# Patient Record
Sex: Male | Born: 1960 | Race: Black or African American | Hispanic: No | Smoking: Never smoker
Health system: Southern US, Community
[De-identification: ages and names within clinical notes are randomized; demographics above are authoritative.]

## PROBLEM LIST (undated history)

## (undated) DIAGNOSIS — I499 Cardiac arrhythmia, unspecified: Secondary | ICD-10-CM

## (undated) DIAGNOSIS — I509 Heart failure, unspecified: Secondary | ICD-10-CM

## (undated) DIAGNOSIS — M1990 Unspecified osteoarthritis, unspecified site: Secondary | ICD-10-CM

## (undated) DIAGNOSIS — E119 Type 2 diabetes mellitus without complications: Secondary | ICD-10-CM

## (undated) DIAGNOSIS — I10 Essential (primary) hypertension: Secondary | ICD-10-CM

## (undated) DIAGNOSIS — K219 Gastro-esophageal reflux disease without esophagitis: Secondary | ICD-10-CM

## (undated) DIAGNOSIS — I480 Paroxysmal atrial fibrillation: Secondary | ICD-10-CM

## (undated) HISTORY — DX: Heart failure, unspecified: I50.9

## (undated) HISTORY — DX: Paroxysmal atrial fibrillation: I48.0

---

## 1998-02-20 ENCOUNTER — Emergency Department (HOSPITAL_COMMUNITY): Admission: EM | Admit: 1998-02-20 | Discharge: 1998-02-20 | Payer: Self-pay | Admitting: Emergency Medicine

## 2004-07-26 ENCOUNTER — Ambulatory Visit: Payer: Self-pay | Admitting: Internal Medicine

## 2004-08-06 ENCOUNTER — Ambulatory Visit: Payer: Self-pay | Admitting: Gastroenterology

## 2004-08-06 HISTORY — PX: UPPER GI ENDOSCOPY: SHX6162

## 2005-10-22 ENCOUNTER — Encounter: Payer: Self-pay | Admitting: Emergency Medicine

## 2008-04-04 ENCOUNTER — Ambulatory Visit: Payer: Self-pay | Admitting: Gastroenterology

## 2008-04-12 ENCOUNTER — Ambulatory Visit (HOSPITAL_COMMUNITY): Admission: RE | Admit: 2008-04-12 | Discharge: 2008-04-12 | Payer: Self-pay | Admitting: Gastroenterology

## 2008-04-12 ENCOUNTER — Ambulatory Visit: Payer: Self-pay | Admitting: Gastroenterology

## 2008-04-12 ENCOUNTER — Encounter: Payer: Self-pay | Admitting: Gastroenterology

## 2008-06-23 HISTORY — PX: ESOPHAGOGASTRODUODENOSCOPY: SHX1529

## 2008-06-23 HISTORY — PX: COLONOSCOPY: SHX174

## 2008-10-30 ENCOUNTER — Telehealth (INDEPENDENT_AMBULATORY_CARE_PROVIDER_SITE_OTHER): Payer: Self-pay

## 2010-07-23 NOTE — Progress Notes (Signed)
Summary: blood in stool  Phone Note Call from Patient Call back at Home Phone [phone]   Caller: Patient Call For: Dr. Cira Servant Summary of Call: pt called- had dark stools x3days then started having bright red blood in stool. Has some stomach cramps at times but no N/V. States he feels weak. Please advise Initial call taken by: Hendricks Limes LPN,  Oct 30, 2008 9:00 AM      Appended Document: blood in stool Please call pt. If he is hvaing rectal bleeding and feeling weak, he should proceed to the ED.  Appended Document: blood in stool pt aware, stated he has appt with PCP at [facility] tomorrow and he may just wait and go there first before trying the ED.

## 2010-11-05 NOTE — Op Note (Signed)
Nguyen, Henry            ACCOUNT NO.:  [id number]   MEDICAL RECORD NO.:  [id number]          PATIENT TYPE:  AMB   LOCATION:  DAY                           FACILITY:  [facility]   PHYSICIAN:  Kassie Mends, M.D.      DATE OF BIRTH:  04-21-1961   DATE OF PROCEDURE:  DATE OF DISCHARGE:                               OPERATIVE REPORT   REFERRING PROVIDED:  [facility] Department.   PROCEDURE:  1. Colonoscopy.  2. Esophagogastroduodenoscopy with cold forceps biopsy.   INDICATION FOR EXAM:  Henry Nguyen is a 50 year old male who presents  with new-onset dyspepsia and rectal bleeding.   FINDINGS:  1. Small internal hemorrhoids.  Pancolonic diverticulosis (left      greater than right).  Otherwise, no polyps, masses, inflammatory      changes, or AVMs seen.  2. Normal esophagus without evidence of Barrett, mass, erosion,      ulceration, or stricture.  3. Patchy erythema seen in the body in the antrum of the stomach with      erosions.  No ulcerations.  No old blood or fresh blood seen in the      stomach.  Biopsies obtained via cold forceps to evaluate for H.      pylori gastritis.  4. Patchy erythema in the duodenal bulb.  Normal second portion of the      duodenum.   DIAGNOSES:  1. Rectal bleeding likely secondary to hemorrhoids.  2. Pancolonic diverticulosis, moderate.  3. Mild gastritis and duodenitis.   RECOMMENDATIONS:  1. Screening colonoscopy in 10 years.  2. He should follow a low-fat diet.  He was given a handout on low-fat      and a high-fiber diet which he should follow.  He was also given      information on diverticulosis, hemorrhoids, and gastritis.  He was      given the [facility] handout on management of gastroesophageal      reflux disease.  I did discuss with him the things that he needs to      do to make his reflux more well controlled.  The discussion      occurred prior to his procedure.  3. No aspirin, NSAIDs, or anticoagulation for 7  days.   MEDICATIONS:  1. Demerol 100 mg IV.  2. Versed 5 mg IV.   PROCEDURE TECHNIQUE:  Physical exam was performed.  Informed consent was  obtained from the patient after explaining the benefits, risks, and  alternatives to the procedure.  The patient was connected to the monitor  and placed in left lateral position.  Continuous oxygen was provided by  nasal cannula.  IV medicine administered through an indwelling cannula.  After administration of sedation and rectal exam, the patient's rectum  was intubated and the scope was advanced under direct visualization to  the cecum.  Scope was removed slowly by carefully examining the color,  texture, anatomy, and integrity of the mucosa on the way out.   After colonoscopy, the patient's esophagus was intubated with a  diagnostic gastroscope, and the scope was advanced under direct  visualization to the second portion of the duodenum.  The scope was  removed slowly by carefully examining the color, texture, anatomy, and  integrity of the mucosa on the way out.  The patient was recovered in  endoscopy and discharged home in satisfactory condition.   PATH:  H. pylori gastritis.      Kassie Mends, M.D.  Electronically Signed     SM/MEDQ  D:  04/12/2008  T:  04/12/2008  Job:  [id number]   cc:   [facility]

## 2010-11-05 NOTE — Consult Note (Signed)
Henry Nguyen, Henry Nguyen            ACCOUNT NO.:  [id number]   MEDICAL RECORD NO.:  [id number]          PATIENT TYPE:  AMB   LOCATION:  DAY                           FACILITY:  [facility]   PHYSICIAN:  Kassie Mends, M.D.      DATE OF BIRTH:  February 12, 1961   DATE OF CONSULTATION:  04/04/2008  DATE OF DISCHARGE:                                 CONSULTATION   REASON FOR CONSULTATION:  Rectal bleeding.   PHYSICIAN REQUESTING CONSULTATION:  Dierdre Forth, nurse practitioner  with [facility] Department.   HISTORY OF PRESENT ILLNESS:  The patient is a 50 year old African-  American gentleman who presents today with complaints of recent  hematochezia.  He had 5 days where with each bowel movement he had  bright red blood per rectum.  He got quite concerned about this.  He  states this he had this a couple of years ago too.  Denies any NSAID or  aspirin use.  At that time, he has only taken some Tylenol.  He denies  any constipation, diarrhea, weight loss.  He also has chronic GERD.  He  states he had an EGD about 3 or 4 years ago with [facility], which  showed he had severe esophagitis.  He was started on PPI.  He took this  for a while because of unanswered reasons, had come off medication.  He  recently have been started back on ranitidine.  He states that the  ranitidine does not help his heartburn at all.  He has daily heartburn.  Denies dysphagia or odynophagia.  His heartburn severely develops  vomiting.  Last time he did this was yesterday.  He has had a sore  throat for a couple of days.  Denies cough or congestion.   CURRENT MEDICATION:  1. Ranitidine 150 mg b.i.d.  2. Tylenol p.r.n.  3. Backaid Max p.r.n.   ALLERGIES:  No known drug allergies.   PAST MEDICAL HISTORY:  Arthritis of the knees, chronic GERD.  He had a  left upper leg surgery due to brown recluse spider bite.   FAMILY HISTORY:  Second-degree relatives with history of colon cancer  and uncles and  cousins.   SOCIAL HISTORY:  Single, unemployed, never been a smoker, history of  crack cocaine use over a year ago, none since.  No alcohol in over a  year.  States he was never a daily drinker.   REVIEW OF SYSTEMS:  GI: As in HPI.  CONSTITUTIONAL:  Denies weight loss.  CARDIOPULMONARY:  Denies chest pain, palpitations, shortness of breath,  or cough.  GENITOURINARY:  Denies dysuria or hematuria.   PHYSICAL EXAMINATION:  VITAL SIGNS:  Weight 238.5, height 5 feet 9  inches, temp 98, blood pressure 130/90, and pulse 76.  GENERAL:  Pleasant, well-nourished, well-developed white gentleman in no  acute distress.  SKIN:  Warm and dry.  No jaundice.  HEENT:  Sclera nonicteric.  Oropharyngeal mucosa moist and pink.  No  lesion, erythema, or exudates.  No lymphadenopathy or thyromegaly.  CHEST:  Lungs are clear to auscultation.  CARDIAC:  Regular rate and rhythm.  Normal S1, S2.  No murmurs, rubs, or  gallops.  ABDOMEN:  Positive bowel sound.  Abdomen is soft, nontender, and  nondistended.  No organomegaly or masses.  No rebound or guarding.  No  abdominal bruits or hernias.  LOWER EXTREMITIES:  No edema.   IMPRESSION:  Henry Nguyen is a 50 year old gentleman with recent small  volume hematochezia.  He has never had a colonoscopy.  Recommend  diagnostic colonoscopy at this time.  He also gives a history of chronic  gastroesophageal reflux disease with poorly controlled reflux on H2  blocker currently.  He also had some intermittent vomiting may be  related to his reflux disease.  We would like to review this prior EGD  report and make determination if he needs to have a repeat EGD done as  well.   PLAN:  1. Colonoscopy with Dr. Cira Servant in the near future.  He is to take no      aspirin products for 4 days prior to procedure.  He will check the      [facility] and make sure there is no aspirin in it.  If he is      unsure, he will hold it for 4 days prior to procedure.  2. Prilosec OTC  one daily, #27 was provided.  3. He will use ranitidine on a p.r.n. basis only.  4. Retrieve records from [facility] regarding last EGD report.      Tana Coast, P.A.      Kassie Mends, M.D.  Electronically Signed    LL/MEDQ  D:  04/04/2008  T:  04/05/2008  Job:  [id number]   cc:   Dierdre Forth, NP  [facility] Department

## 2015-08-15 ENCOUNTER — Ambulatory Visit (INDEPENDENT_AMBULATORY_CARE_PROVIDER_SITE_OTHER): Payer: Worker's Compensation | Admitting: Pediatrics

## 2015-08-15 ENCOUNTER — Ambulatory Visit (INDEPENDENT_AMBULATORY_CARE_PROVIDER_SITE_OTHER): Payer: Worker's Compensation

## 2015-08-15 VITALS — BP 146/86 | HR 72 | Temp 96.9°F | Ht 69.0 in | Wt 212.2 lb

## 2015-08-15 DIAGNOSIS — M79642 Pain in left hand: Secondary | ICD-10-CM | POA: Diagnosis not present

## 2015-08-15 DIAGNOSIS — M25532 Pain in left wrist: Secondary | ICD-10-CM

## 2015-08-15 IMAGING — CR DG HAND COMPLETE 3+V*L*
3 series · 3 of 3 positions shown · non-contrast
Comparison: None in PACs

CLINICAL DATA: Post fall backwards catching himself with the left
hand and wrist.

EXAM:
LEFT HAND - COMPLETE 3+ VIEW

[view not recorded (1 of 3)]
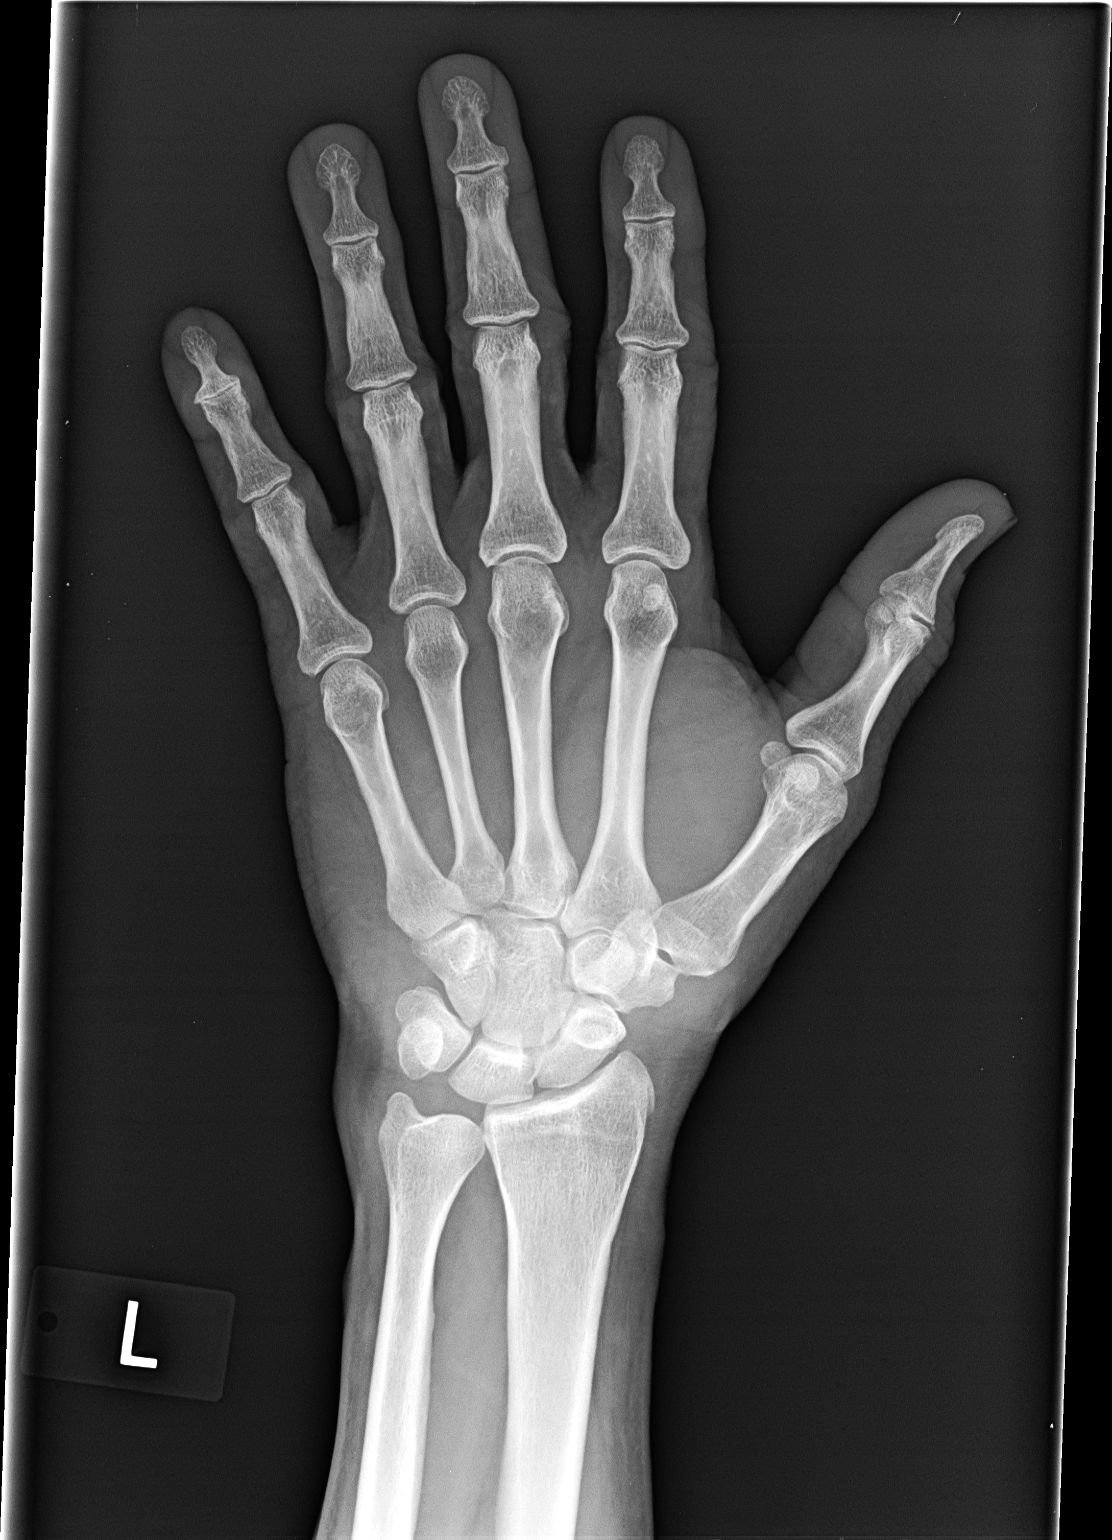

[view not recorded (2 of 3)]
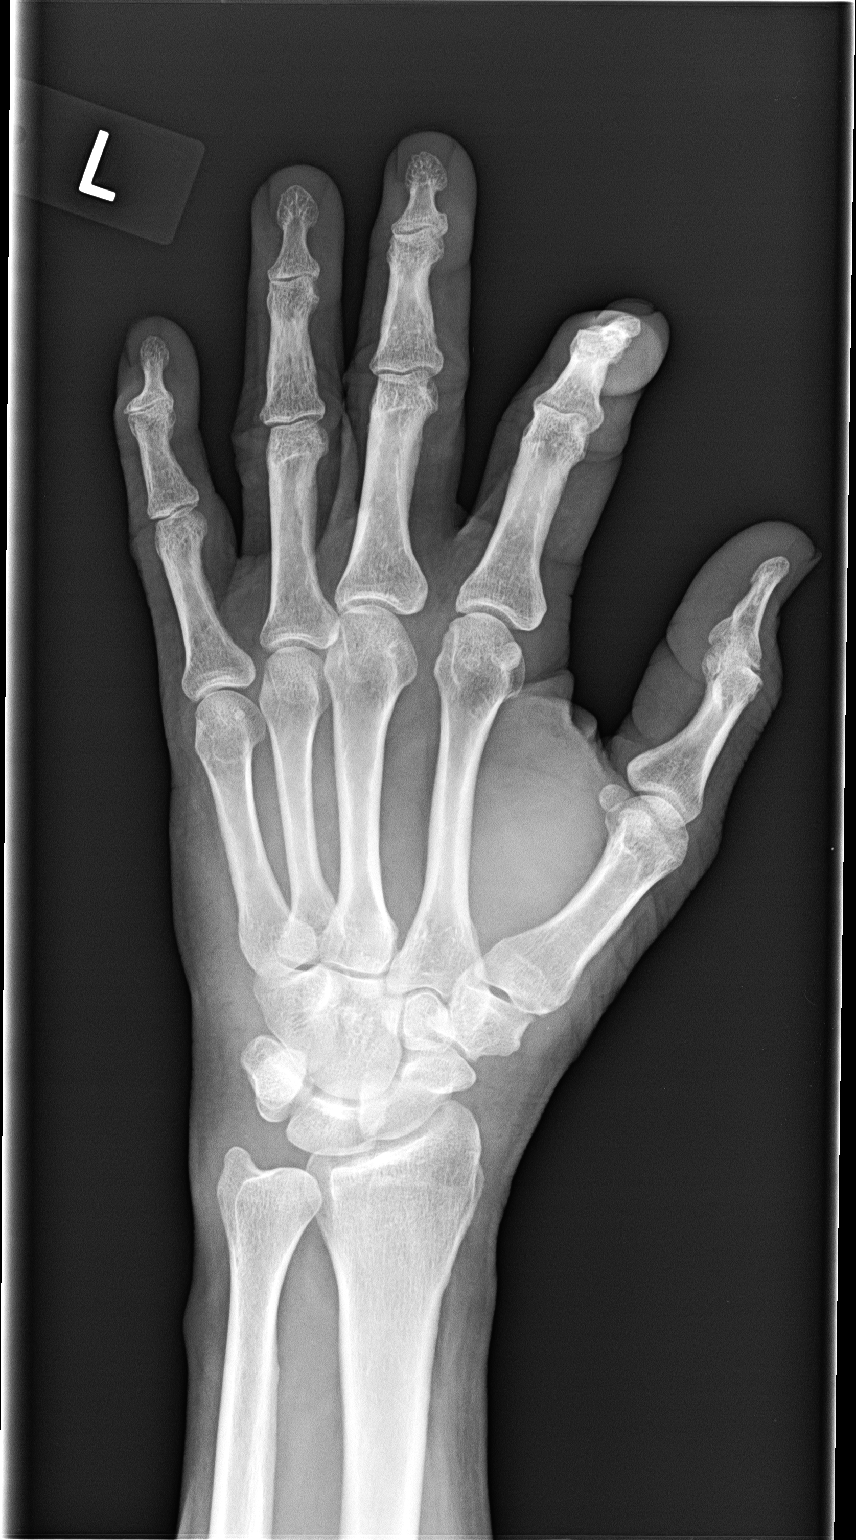

[view not recorded (3 of 3)]
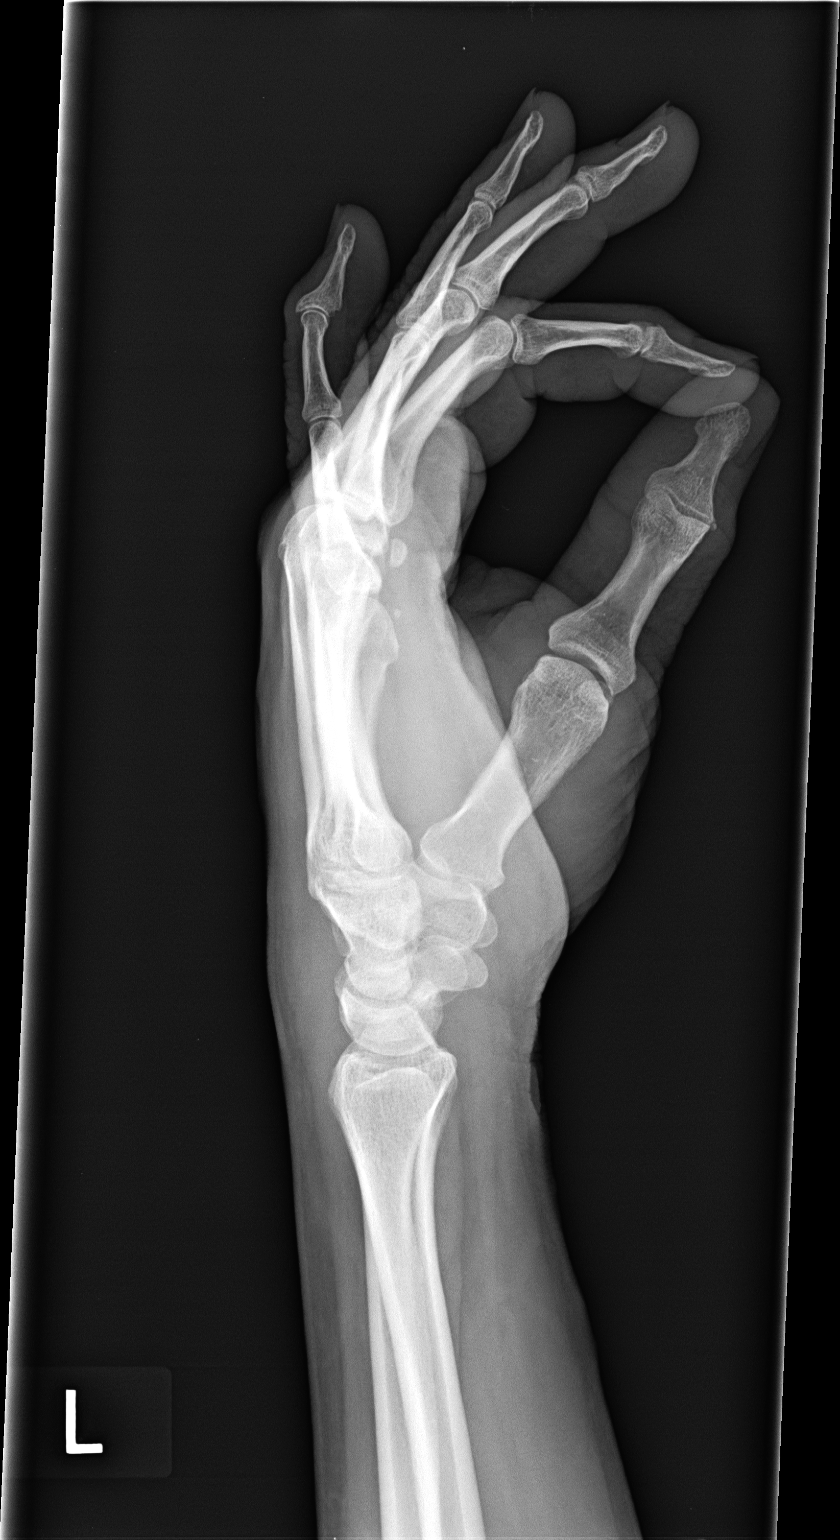

[3 of 3 positions shown; findings below may reference images not displayed]

FINDINGS: The bones of the left hand are adequately mineralized. There is no
acute fracture nor dislocation. There is mild narrowing of the
interphalangeal joints. Mild hypertrophic changes of the DIP joint
of the fifth finger are noted. The metacarpophalangeal joints and
the carpometacarpal joints are preserved. The observed portions of
the carpal bones and distal radius and ulna appear normal.
IMPRESSION: There is no acute bony abnormality of the left hand. There is mild
osteoarthritic change of the interphalangeal joints.

## 2015-08-15 NOTE — Patient Instructions (Signed)
Wrist Pain There are many things that can cause wrist pain. Some common causes include:  An injury to the wrist area, such as a sprain, strain, or fracture.  Overuse of the joint.  A condition that causes increased pressure on a nerve in the wrist (carpal tunnel syndrome).  Wear and tear of the joints that occurs with aging (osteoarthritis).  A variety of other types of arthritis. Sometimes, the cause of wrist pain is not known. The pain often goes away when you follow your health care provider's instructions for relieving pain at home. If your wrist pain continues, tests may need to be done to diagnose your condition. HOME CARE INSTRUCTIONS Pay attention to any changes in your symptoms. Take these actions to help with your pain:  Rest the wrist area for at least 48 hours or as told by your health care provider.  If directed, apply ice to the injured area:  Put ice in a plastic bag.  Place a towel between your skin and the bag.  Leave the ice on for 20 minutes, 2-3 times per day.  Keep your arm raised (elevated) above the level of your heart while you are sitting or lying down.  If a splint or elastic bandage has been applied, use it as told by your health care provider.  Remove the splint or bandage only as told by your health care provider.  Loosen the splint or bandage if your fingers become numb or have a tingling feeling, or if they turn cold or blue.  Take over-the-counter and prescription medicines only as told by your health care provider.  Keep all follow-up visits as told by your health care provider. This is important. SEEK MEDICAL CARE IF:  Your pain is not helped by treatment.  Your pain gets worse. SEEK IMMEDIATE MEDICAL CARE IF:  Your fingers become swollen.  Your fingers turn white, very red, or cold and blue.  Your fingers are numb or have a tingling feeling.  You have difficulty moving your fingers.   This information is not intended to replace  advice given to you by your health care provider. Make sure you discuss any questions you have with your health care provider.   Document Released: 03/19/2005 Document Revised: 02/28/2015 Document Reviewed: 10/25/2014 Elsevier Interactive Patient Education 2016 Elsevier Inc. Scaphoid Fracture, Wrist A fracture is a break in the bone. The bone you have broken often does not show up as a fracture on x-ray until later on in the healing phase. This bone is called the scaphoid bone. With this bone, your caregiver will often cast or splint your wrist as though it is fractured, even if a fracture is not seen on the x-ray. This is often done with wrist injuries in which there is tenderness at the base of the thumb. An x-ray at 1-3 weeks after your injury may confirm this fracture. A cast or splint is used to protect and keep your injured bone in good position for healing. The cast or splint will be on generally for about 6 to 16 weeks, depending on your health, age, the fracture location and how quickly you heal. Another name for the scaphoid bone is the navicular bone. HOME CARE INSTRUCTIONS   To lessen the swelling and pain, keep the injured part elevated above your heart while sitting or lying down.  Apply ice to the injury for 15-20 minutes, 03-04 times per day while awake, for 2 days. Put the ice in a plastic bag and place a  thin towel between the bag of ice and your cast.  If you have a plaster or fiberglass cast or splint:  Do not try to scratch the skin under the cast using sharp or pointed objects.  Check the skin around the cast every day. You may put lotion on any red or sore areas.  Keep your cast or splint dry and clean.  If you have a plaster splint:  Wear the splint as directed.  You may loosen the elastic bandage around the splint if your fingers become numb, tingle, or turn cold or blue.  If you have been put in a removable splint, wear and use as directed.  Do not put pressure  on any part of your cast or splint; it may deform or break. Rest your cast or splint only on a pillow the first 24 hours until it is fully hardened.  Your cast or splint can be protected during bathing with a plastic bag. Do not lower the cast or splint into water.  Only take over-the-counter or prescription medicines for pain, discomfort, or fever as directed by your caregiver.  If your caregiver has given you a follow up appointment, it is very important to keep that appointment. Not keeping the appointment could result in chronic pain and decreased function. If there is any problem keeping the appointment, you must call back to this facility for assistance. SEEK IMMEDIATE MEDICAL CARE IF:   Your cast gets damaged, wet or breaks.  You have continued severe pain or more swelling than you did before the cast or splint was put on.  Your skin or nails below the injury turn blue or gray, or feel cold or numb.  You have tingling or burning pain in your fingers or increasing pain with movement of your fingers   This information is not intended to replace advice given to you by your health care provider. Make sure you discuss any questions you have with your health care provider.   Document Released: 05/30/2002 Document Revised: 09/01/2011 Document Reviewed: 12/20/2014 Elsevier Interactive Patient Education [employer].

## 2015-08-15 NOTE — Progress Notes (Addendum)
    Subjective:    Patient ID: Henry Nguyen, male    DOB: 22-Jul-1960, 55 y.o.   MRN: [id number]  CC: Worker's comp   HPI: Henry Nguyen is a 55 y.o. male presenting for [employer] from standing onto concrete backward onto dorsiflexed hand today, 08/15/2015 Hurt immediately Has taken ibuprofen, for pain Throbbing all the time No other injury in fall Can wiggle fingers, pain with movement wrist in all directions, including supination Points to lateral dorsum of wrist when asked where pain is greatest Wrist is swollen Sensation intact in hand  Relevant past medical, surgical, family and social history reviewed and updated as indicated. Interim medical history since our last visit reviewed. Allergies and medications reviewed and updated.    ROS: Per HPI unless specifically indicated above       Objective:    BP 146/86 mmHg  Pulse 72  Temp(Src) 96.9 F (36.1 C) (Oral)  Ht 5\' 9"  (1.753 m)  Wt 212 lb 3.2 oz (96.253 kg)  BMI 31.32 kg/m2  Wt Readings from Last 3 Encounters:  08/15/15 212 lb 3.2 oz (96.253 kg)    Gen: NAD, alert, cooperative with exam, NCAT EYES: EOMI, no scleral injection or icterus CV: WWP, distal pulses 2+ b/l Resp:  normal WOB Neuro: Alert and oriented, strength equal b/l UE and LE, coordination grossly normal MSK: L posterior lateral wrist swollen, TTP over snuff box, distal radius, proximal 2nd and 3rd metacarpals. Minimal ROM in wrist limited by pain, moves a few degrees in all directions, can extend and flex fingers though does have pain, pain with thumb movement, pain with supination.     Assessment & Plan:    Henry Nguyen was seen today for L wrist injury, fall backward onto outstretched hand. Tender over snuff box, limited mobility in hand. Xray with no acute fracture. Cannot rule out scaphoid fracture given exam. Will put in wrist/thumb immobilizer, wear all the time, RTC 1 week for L wrist xray.    Diagnoses and all orders for this  visit:  Wrist pain, acute, left -     DG Hand Complete Left; Future  Hand pain, left -     DG Hand Complete Left; Future   Follow up plan: Return in about 1 week (around 08/22/2015) for will need repeat L wrist film.  Assunta Found, MD [facility] 08/15/2015, 12:51 PM

## 2015-08-21 ENCOUNTER — Ambulatory Visit (INDEPENDENT_AMBULATORY_CARE_PROVIDER_SITE_OTHER): Payer: Worker's Compensation | Admitting: Nurse Practitioner

## 2015-08-21 ENCOUNTER — Encounter: Payer: Self-pay | Admitting: Nurse Practitioner

## 2015-08-21 ENCOUNTER — Ambulatory Visit (INDEPENDENT_AMBULATORY_CARE_PROVIDER_SITE_OTHER): Payer: Worker's Compensation

## 2015-08-21 ENCOUNTER — Ambulatory Visit: Payer: Worker's Compensation | Admitting: Nurse Practitioner

## 2015-08-21 VITALS — BP 154/87 | HR 66 | Temp 97.5°F | Ht 69.0 in | Wt 219.0 lb

## 2015-08-21 DIAGNOSIS — S66292D Other specified injury of extensor muscle, fascia and tendon of left thumb at wrist and hand level, subsequent encounter: Secondary | ICD-10-CM

## 2015-08-21 DIAGNOSIS — S66292S Other specified injury of extensor muscle, fascia and tendon of left thumb at wrist and hand level, sequela: Secondary | ICD-10-CM

## 2015-08-21 DIAGNOSIS — S63602D Unspecified sprain of left thumb, subsequent encounter: Secondary | ICD-10-CM

## 2015-08-21 DIAGNOSIS — S63502D Unspecified sprain of left wrist, subsequent encounter: Secondary | ICD-10-CM

## 2015-08-21 IMAGING — CR DG WRIST COMPLETE 3+V*L*
3 series · 3 of 3 positions shown · non-contrast
Comparison: Plain films of the left hand [DATE].

CLINICAL DATA: Status post fall backwards [DATE]. Continued
right thumb pain. Subsequent encounter.

EXAM:
LEFT WRIST - COMPLETE 3+ VIEW

[view not recorded (1 of 3)]
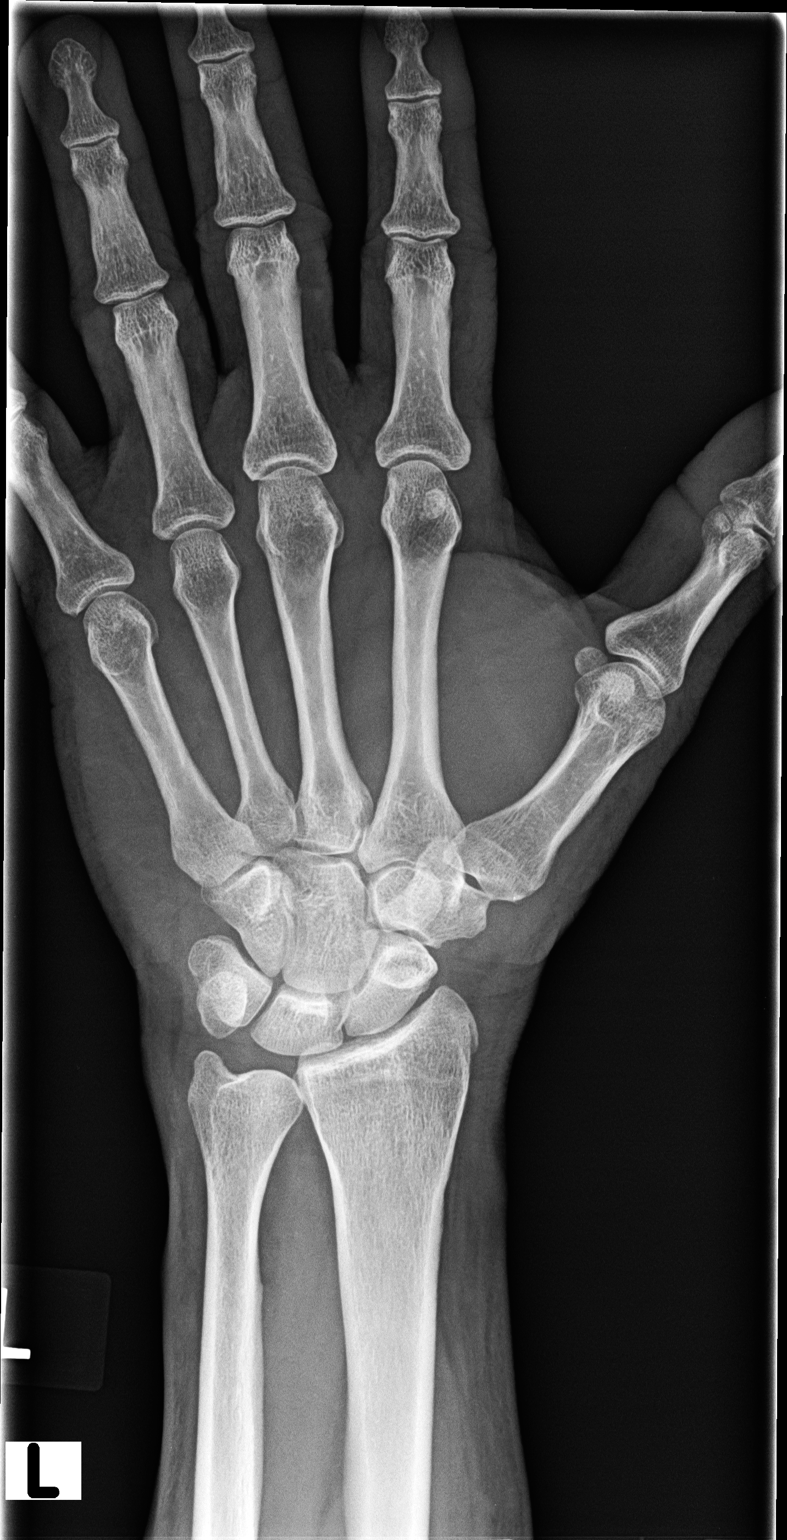

[view not recorded (2 of 3)]
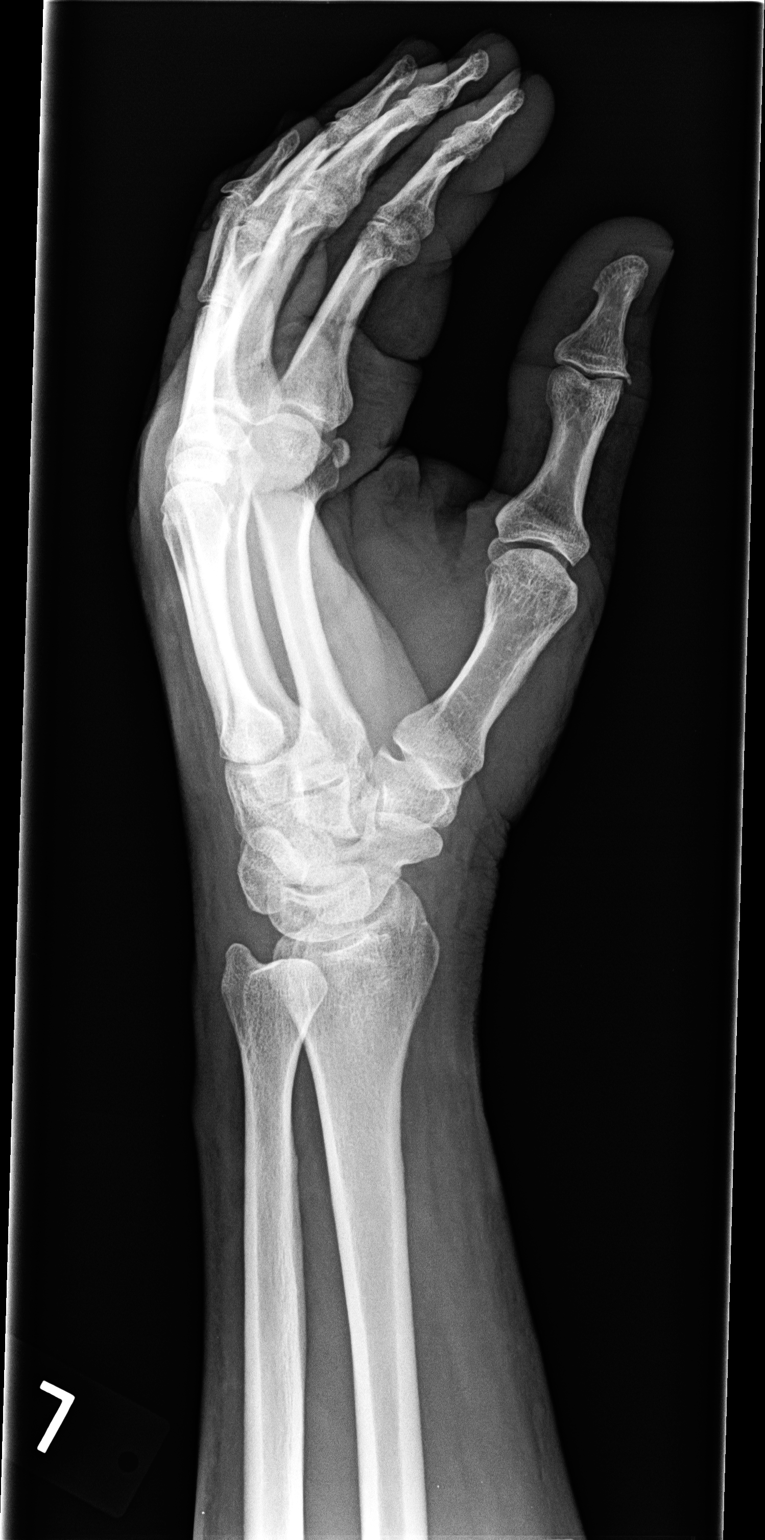

[view not recorded (3 of 3)]
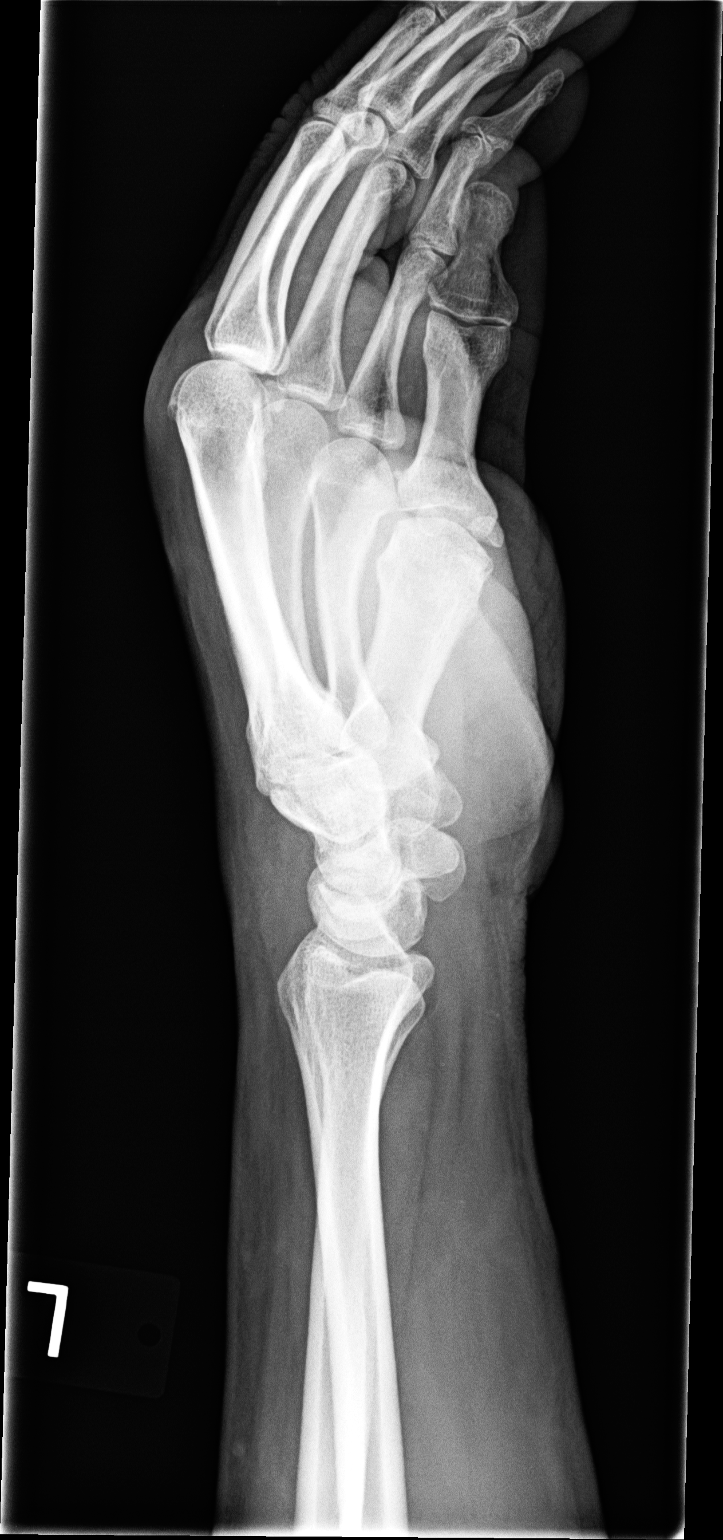

[3 of 3 positions shown; findings below may reference images not displayed]

FINDINGS: There is no evidence of fracture or dislocation. There is no
evidence of arthropathy or other focal bone abnormality. Soft
tissues are unremarkable.
IMPRESSION: Negative exam.

## 2015-08-21 NOTE — Patient Instructions (Signed)
Wrist Sprain °A wrist sprain is a stretch or tear in the strong, fibrous tissues (ligaments) that connect your wrist bones. The ligaments of your wrist may be easily sprained. There are three types of wrist sprains. °· Grade 1. The ligament is not stretched or torn, but the sprain causes pain. °· Grade 2. The ligament is stretched or partially torn. You may be able to move your wrist, but not very much. °· Grade 3. The ligament or muscle completely tears. You may find it difficult or extremely painful to move your wrist even a little. °CAUSES °Often, wrist sprains are a result of a fall or an injury. The force of the impact causes the fibers of your ligament to stretch too much or tear. Common causes of wrist sprains include: °· Overextending your wrist while catching a ball with your hands. °· Repetitive or strenuous extension or bending of your wrist. °· Landing on your hand during a fall. °RISK FACTORS °· Having previous wrist injuries. °· Playing contact sports, such as boxing or wrestling. °· Participating in activities in which falling is common. °· Having poor wrist strength and flexibility. °SIGNS AND SYMPTOMS °· Wrist pain. °· Wrist tenderness. °· Inflammation or bruising of the wrist area. °· Hearing a "pop" or feeling a tear at the time of the injury. °· Decreased wrist movement due to pain, stiffness, or weakness. °DIAGNOSIS °Your health care provider will examine your wrist. In some cases, an X-ray will be taken to make sure you did not break any bones. If your health care provider thinks that you tore a ligament, he or she may order an MRI of your wrist. °TREATMENT °Treatment involves resting and icing your wrist. You may also need to take pain medicines to help lessen pain and inflammation. Your health care provider may recommend keeping your wrist still (immobilized) with a splint to help your sprain heal. When the splint is no longer necessary, you may need to perform strengthening and stretching  exercises. These exercises help you to regain strength and full range of motion in your wrist. Surgery is not usually needed for wrist sprains unless the ligament completely tears. °HOME CARE INSTRUCTIONS °· Rest your wrist. Do not do things that cause pain. °· Wear your wrist splint as directed by your health care provider. °· Take medicines only as directed by your health care provider. °· To ease pain and swelling, apply ice to the injured area. °¨ Put ice in a plastic bag. °¨ Place a towel between your skin and the bag. °¨ Leave the ice on for 20 minutes, 2-3 times a day. °SEEK MEDICAL CARE IF: °· Your pain, discomfort, or swelling gets worse even with treatment. °· You feel sudden numbness in your hand. °  °This information is not intended to replace advice given to you by your health care provider. Make sure you discuss any questions you have with your health care provider. °  °Document Released: 02/10/2014 Document Reviewed: 02/10/2014 °Elsevier Interactive Patient Education ©2016 Elsevier Inc. ° °

## 2015-08-21 NOTE — Progress Notes (Signed)
   Subjective:    Patient ID: Henry Nguyen, male    DOB: 11-14-1960, 55 y.o.   MRN: [id number]  HPI DATE OF INJURY  08/15/15  Staff  [facility]  He She had fallen and injured left wrist and hand- x rays were done which were negative. She was placed in a thumb/wrist immobilizer and was told to follow up in 1 week to repeat xrays. Still having lots of pain with movement. Brace helps some.    Review of Systems  Constitutional: Negative.   Respiratory: Negative.   Cardiovascular: Negative.   Neurological: Negative.   Psychiatric/Behavioral: Negative.   All other systems reviewed and are negative.      Objective:   Physical Exam  Constitutional: He appears well-developed and well-nourished.  Musculoskeletal:  Left thumb wrist brace in place- brace removed- decrease rom of thumb with oppostion to fingers- grip very weak- painon palpation at base of thumb and down wrist and forearm.   BP 154/87 mmHg  Pulse 66  Temp(Src) 97.5 F (36.4 C) (Oral)  Ht 5\' 9"  (1.753 m)  Wt 219 lb (99.338 kg)  BMI 32.33 kg/m2  Left wrist and hand xray- unchanged from previous- no fracture-Preliminary reading by Ronnald Collum, FNP  [facility]      Assessment & Plan:   1. Other specified injury of extensor muscle, fascia and tendon of left thumb at wrist and hand level, sequela   2. Wrist sprain, left, subsequent encounter   3. Thumb sprain, left, subsequent encounter    contiunue  To wear brace Rest Ice Elevate when sitting May take several weeks to resolve Motrin OTC every 6 hours RTO prn  Mary-Margaret Hassell Done, FNP

## 2015-08-22 ENCOUNTER — Ambulatory Visit: Payer: Self-pay | Admitting: Pediatrics

## 2017-12-01 ENCOUNTER — Inpatient Hospital Stay (HOSPITAL_COMMUNITY)
Admission: EM | Admit: 2017-12-01 | Discharge: 2017-12-03 | DRG: 638 | Disposition: A | Discharge status: Home / Self Care | Payer: Self-pay | Attending: Family Medicine | Admitting: Family Medicine

## 2017-12-01 ENCOUNTER — Other Ambulatory Visit: Payer: Self-pay

## 2017-12-01 ENCOUNTER — Encounter (HOSPITAL_COMMUNITY): Payer: Self-pay

## 2017-12-01 DIAGNOSIS — N179 Acute kidney failure, unspecified: Secondary | ICD-10-CM | POA: Diagnosis present

## 2017-12-01 DIAGNOSIS — K219 Gastro-esophageal reflux disease without esophagitis: Secondary | ICD-10-CM | POA: Diagnosis present

## 2017-12-01 DIAGNOSIS — K2100 Gastro-esophageal reflux disease with esophagitis, without bleeding: Secondary | ICD-10-CM

## 2017-12-01 DIAGNOSIS — Z7982 Long term (current) use of aspirin: Secondary | ICD-10-CM

## 2017-12-01 DIAGNOSIS — I10 Essential (primary) hypertension: Secondary | ICD-10-CM | POA: Diagnosis present

## 2017-12-01 DIAGNOSIS — E871 Hypo-osmolality and hyponatremia: Secondary | ICD-10-CM | POA: Diagnosis present

## 2017-12-01 DIAGNOSIS — K625 Hemorrhage of anus and rectum: Secondary | ICD-10-CM | POA: Diagnosis present

## 2017-12-01 DIAGNOSIS — Z79899 Other long term (current) drug therapy: Secondary | ICD-10-CM

## 2017-12-01 DIAGNOSIS — Z7984 Long term (current) use of oral hypoglycemic drugs: Secondary | ICD-10-CM

## 2017-12-01 DIAGNOSIS — E1100 Type 2 diabetes mellitus with hyperosmolarity without nonketotic hyperglycemic-hyperosmolar coma (NKHHC): Principal | ICD-10-CM | POA: Diagnosis present

## 2017-12-01 HISTORY — DX: Essential (primary) hypertension: I10

## 2017-12-01 HISTORY — DX: Type 2 diabetes mellitus without complications: E11.9

## 2017-12-01 HISTORY — DX: Gastro-esophageal reflux disease without esophagitis: K21.9

## 2017-12-01 LAB — URINALYSIS, ROUTINE W REFLEX MICROSCOPIC
Bacteria, UA: NONE SEEN
Bilirubin Urine: NEGATIVE
Glucose, UA: >=500 mg/dL — AB
Hgb urine dipstick: NEGATIVE
Ketones, ur: 5 mg/dL — AB
Leukocytes, UA: NEGATIVE
Nitrite: NEGATIVE
Protein, ur: NEGATIVE mg/dL
Specific Gravity, Urine: 1.026 (ref 1.005–1.030)
pH: 5 (ref 5.0–8.0)

## 2017-12-01 LAB — CBC WITH DIFFERENTIAL/PLATELET
Basophils Absolute: 0 10*3/uL (ref 0.0–0.1)
Basophils Relative: 0 %
Eosinophils Absolute: 0.1 10*3/uL (ref 0.0–0.7)
Eosinophils Relative: 1 %
HCT: 42.2 % (ref 39.0–52.0)
Hemoglobin: 14.1 g/dL (ref 13.0–17.0)
Lymphocytes Relative: 19 %
Lymphs Abs: 1.8 10*3/uL (ref 0.7–4.0)
MCH: 27.6 pg (ref 26.0–34.0)
MCHC: 33.4 g/dL (ref 30.0–36.0)
MCV: 82.6 fL (ref 78.0–100.0)
Monocytes Absolute: 0.9 10*3/uL (ref 0.1–1.0)
Monocytes Relative: 10 %
Neutro Abs: 6.6 10*3/uL (ref 1.7–7.7)
Neutrophils Relative %: 70 %
Platelets: 189 10*3/uL (ref 150–400)
RBC: 5.11 MIL/uL (ref 4.22–5.81)
RDW: 13.2 % (ref 11.5–15.5)
WBC: 9.4 10*3/uL (ref 4.0–10.5)

## 2017-12-01 LAB — COMPREHENSIVE METABOLIC PANEL
ALT: 22 U/L (ref 17–63)
AST: 19 U/L (ref 15–41)
Albumin: 3.8 g/dL (ref 3.5–5.0)
Alkaline Phosphatase: 139 U/L — ABNORMAL HIGH (ref 38–126)
Anion gap: 12 (ref 5–15)
BUN: 34 mg/dL — ABNORMAL HIGH (ref 6–20)
CO2: 24 mmol/L (ref 22–32)
Calcium: 8.8 mg/dL — ABNORMAL LOW (ref 8.9–10.3)
Chloride: 84 mmol/L — ABNORMAL LOW (ref 101–111)
Creatinine, Ser: 1.98 mg/dL — ABNORMAL HIGH (ref 0.61–1.24)
GFR calc Af Amer: 41 mL/min — ABNORMAL LOW (ref >60)
GFR calc non Af Amer: 36 mL/min — ABNORMAL LOW (ref >60)
Glucose, Bld: 807 mg/dL (ref 65–99)
Potassium: 4.1 mmol/L (ref 3.5–5.1)
Sodium: 120 mmol/L — ABNORMAL LOW (ref 135–145)
Total Bilirubin: 0.8 mg/dL (ref 0.3–1.2)
Total Protein: 7.1 g/dL (ref 6.5–8.1)

## 2017-12-01 LAB — LIPASE, BLOOD: Lipase: 50 U/L (ref 11–51)

## 2017-12-01 LAB — CBG MONITORING, ED: Glucose-Capillary: >600 mg/dL (ref 65–99)

## 2017-12-01 MED ORDER — ONDANSETRON HCL 4 MG/2ML IJ SOLN
4.0000 mg | Freq: Four times a day (QID) | INTRAMUSCULAR | Status: DC | PRN
Start: 2017-12-01 — End: 2017-12-03

## 2017-12-01 MED ORDER — DEXTROSE-NACL 5-0.45 % IV SOLN
INTRAVENOUS | Status: DC
  Administered 2017-12-02: 04:00:00 via INTRAVENOUS

## 2017-12-01 MED ORDER — SODIUM CHLORIDE 0.45 % IV SOLN
INTRAVENOUS | Status: DC
  Administered 2017-12-02: 01:00:00 via INTRAVENOUS

## 2017-12-01 MED ORDER — DEXTROSE 50 % IV SOLN: 25.0000 mL | INTRAVENOUS | Status: DC | PRN

## 2017-12-01 MED ORDER — INSULIN REGULAR BOLUS VIA INFUSION
0.0000 [IU] | Freq: Three times a day (TID) | INTRAVENOUS | Status: DC
  Filled 2017-12-01: qty 10

## 2017-12-01 MED ORDER — ENOXAPARIN SODIUM 40 MG/0.4ML ~~LOC~~ SOLN: 40.0000 mg | SUBCUTANEOUS | Status: DC

## 2017-12-01 MED ORDER — ACETAMINOPHEN 325 MG PO TABS: 650.0000 mg | ORAL_TABLET | Freq: Four times a day (QID) | ORAL | Status: DC | PRN

## 2017-12-01 MED ORDER — ONDANSETRON HCL 4 MG PO TABS: 4.0000 mg | ORAL_TABLET | Freq: Four times a day (QID) | ORAL | Status: DC | PRN

## 2017-12-01 MED ORDER — POTASSIUM CHLORIDE IN NACL 20-0.45 MEQ/L-% IV SOLN
INTRAVENOUS | Status: AC
  Administered 2017-12-01: 23:00:00 via INTRAVENOUS
  Filled 2017-12-01 (×2): qty 1000

## 2017-12-01 MED ORDER — SODIUM CHLORIDE 0.9 % IV SOLN
INTRAVENOUS | Status: DC
  Filled 2017-12-01: qty 1

## 2017-12-01 MED ORDER — SODIUM CHLORIDE 0.9 % IV SOLN
INTRAVENOUS | Status: DC
  Administered 2017-12-01: 5.4 [IU]/h via INTRAVENOUS
  Filled 2017-12-01 (×2): qty 1

## 2017-12-01 MED ORDER — ENOXAPARIN SODIUM 40 MG/0.4ML ~~LOC~~ SOLN
40.0000 mg | SUBCUTANEOUS | Status: DC
  Administered 2017-12-01 – 2017-12-02 (×2): 40 mg via SUBCUTANEOUS
  Filled 2017-12-01 (×2): qty 0.4

## 2017-12-01 MED ORDER — ONDANSETRON HCL 4 MG/2ML IJ SOLN: 4.0000 mg | Freq: Four times a day (QID) | INTRAMUSCULAR | Status: DC | PRN

## 2017-12-01 MED ORDER — DEXTROSE-NACL 5-0.45 % IV SOLN: INTRAVENOUS | Status: DC

## 2017-12-01 MED ORDER — ACETAMINOPHEN 650 MG RE SUPP: 650.0000 mg | Freq: Four times a day (QID) | RECTAL | Status: DC | PRN

## 2017-12-01 MED ORDER — SODIUM CHLORIDE 0.9 % IV BOLUS
2000.0000 mL | Freq: Once | INTRAVENOUS | Status: AC
  Administered 2017-12-01: 2000 mL via INTRAVENOUS

## 2017-12-01 MED ORDER — INSULIN REGULAR BOLUS VIA INFUSION
0.0000 [IU] | Freq: Three times a day (TID) | INTRAVENOUS | Status: DC
  Administered 2017-12-02: 3.9 [IU] via INTRAVENOUS
  Filled 2017-12-01: qty 10

## 2017-12-01 MED ORDER — SODIUM CHLORIDE 0.9 % IV SOLN
INTRAVENOUS | Status: DC
  Administered 2017-12-01: 22:00:00 via INTRAVENOUS

## 2017-12-01 NOTE — ED Notes (Signed)
CRITICAL VALUE ALERT  Critical Value:  Glucose 804 Date & Time Notied: 12/01/17 @ 2105 Provider Notified: Dr Thurnell Garbe Orders Received/Actions taken:None yet

## 2017-12-01 NOTE — ED Triage Notes (Signed)
Patient states that he had blood work done at his doctor office this morning and they called him and said his blood sugar was greater than 1000 and his cholesterol was high.  Urinary frequency, nausea and vomiting, and drinking lots of fluids.  He has also been passing bright red blood in his stool for around one week.  Does not regularly check his blood sugars.  States that he has lost appx. 24 pounds in a few weeks.

## 2017-12-01 NOTE — ED Provider Notes (Signed)
[facility] EMERGENCY DEPARTMENT Provider Note   CSN: [id number] Arrival date & time: 12/01/17  1954     History   Chief Complaint Chief Complaint  Patient presents with  . Hyperglycemia    HPI Henry Nguyen is a 57 y.o. male.  HPI Pt was seen at 2020. Per pt and his family, c/o gradual onset and worsening of constant polydipsia and polyuria for the past 1+ weeks. Pt states he has also had several episodes of N/V and "lost 24 pounds in the past few weeks." Endorses hx of compliance with his DM meds, but does not regularly check his CBG's at home. States over the past week, his CBG's were reading "high." Pt was evaluated by his PMD today and had labs checked. Pt states he was called tonight and told to come to the ED for evaluation/admission for "blood sugar over 1000." Pt denies diarrhea, no abd pain, no CP/SOB, no cough, no fevers, no rash.    Past Medical History:  Diagnosis Date  . Diabetes mellitus without complication ([place])   . GERD (gastroesophageal reflux disease)   . Hypertension     There are no active problems to display for this patient.   History reviewed. No pertinent surgical history.      Home Medications    Prior to Admission medications   Medication Sig Start Date End Date Taking? Authorizing Provider  aspirin EC 81 MG tablet Take 81 mg by mouth daily.   Yes [provider]  glipiZIDE (GLUCOTROL XL) 10 MG 24 hr tablet Take 10 mg by mouth daily with breakfast.   Yes [provider]  lisinopril (PRINIVIL,ZESTRIL) 10 MG tablet Take 10 mg by mouth daily.   Yes [provider]  metFORMIN (GLUCOPHAGE) 500 MG tablet Take 500 mg by mouth 2 (two) times daily. 11/02/17  Yes [provider]  omeprazole (PRILOSEC) 40 MG capsule Take 40 mg by mouth daily.   Yes [provider]    Family History History reviewed. No pertinent family history.  Social History Social History   Tobacco Use  . Smoking status: Never  Smoker  . Smokeless tobacco: Never Used  Substance Use Topics  . Alcohol use: Never    Alcohol/week: 0.0 oz    Frequency: Never  . Drug use: Not Currently     Allergies   Patient has no known allergies.   Review of Systems Review of Systems ROS: Statement: All systems negative except as marked or noted in the HPI; Constitutional: Negative for fever and chills. +increased thirst and urination. ; ; Eyes: Negative for eye pain, redness and discharge. ; ; ENMT: Negative for ear pain, hoarseness, nasal congestion, sinus pressure and sore throat. ; ; Cardiovascular: Negative for chest pain, palpitations, diaphoresis, dyspnea and peripheral edema. ; ; Respiratory: Negative for cough, wheezing and stridor. ; ; Gastrointestinal: +N/V. Negative for diarrhea, abdominal pain, blood in stool, hematemesis, jaundice and rectal bleeding. . ; ; Genitourinary: Negative for dysuria, flank pain and hematuria. ; ; Musculoskeletal: Negative for back pain and neck pain. Negative for swelling and trauma.; ; Skin: Negative for pruritus, rash, abrasions, blisters, bruising and skin lesion.; ; Neuro: Negative for headache, lightheadedness and neck stiffness. Negative for weakness, altered level of consciousness, altered mental status, extremity weakness, paresthesias, involuntary movement, seizure and syncope.       Physical Exam Updated Vital Signs BP 118/62   Pulse 74   Temp 98.1 F (36.7 C) (Oral)   Ht 5\' 9"  (1.753 m)  Wt 88.5 kg (195 lb)   SpO2 98%   BMI 28.80 kg/m   Physical Exam 2025: Physical examination:  Nursing notes reviewed; Vital signs and O2 SAT reviewed;  Constitutional: Well developed, Well nourished, In no acute distress; Head:  Normocephalic, atraumatic; Eyes: EOMI, PERRL, No scleral icterus; ENMT: Mouth and pharynx normal, Mucous membranes dry; Neck: Supple, Full range of motion, No lymphadenopathy; Cardiovascular: Regular rate and rhythm, No gallop; Respiratory: Breath sounds clear &  equal bilaterally, No wheezes.  Speaking full sentences with ease, Normal respiratory effort/excursion; Chest: Nontender, Movement normal; Abdomen: Soft, Nontender, Nondistended, Normal bowel sounds; Genitourinary: No CVA tenderness; Extremities: Peripheral pulses normal, No tenderness, No edema, No calf edema or asymmetry.; Neuro: AA&Ox3, Major CN grossly intact.  Speech clear. No gross focal motor or sensory deficits in extremities.; Skin: Color normal, Warm, Dry.   ED Treatments / Results  Labs (all labs ordered are listed, but only abnormal results are displayed)   EKG None  Radiology   Procedures Procedures (including critical care time)  Medications Ordered in ED Medications  sodium chloride 0.9 % bolus 2,000 mL (2,000 mLs Intravenous New Bag/Given 12/01/17 2033)  dextrose 5 %-0.45 % sodium chloride infusion (has no administration in time range)  insulin regular bolus via infusion 0-10 Units (has no administration in time range)  insulin regular (NOVOLIN R,HUMULIN R) 100 Units in sodium chloride 0.9 % 100 mL (1 Units/mL) infusion (has no administration in time range)  dextrose 50 % solution 25 mL (has no administration in time range)  0.9 %  sodium chloride infusion (has no administration in time range)     Initial Impression / Assessment and Plan / ED Course  I have reviewed the triage vital signs and the nursing notes.  Pertinent labs & imaging results that were available during my care of the patient were reviewed by me and considered in my medical decision making (see chart for details).  MDM Reviewed: previous chart, nursing note and vitals Reviewed previous: labs Interpretation: labs Total time providing critical care: 30-74 minutes. This excludes time spent performing separately reportable procedures and services. Consults: admitting MD   CRITICAL CARE Performed by: Alfonzo Feller Total critical care time: 35 minutes Critical care time was exclusive of  separately billable procedures and treating other patients. Critical care was necessary to treat or prevent imminent or life-threatening deterioration. Critical care was time spent personally by me on the following activities: development of treatment plan with patient and/or surrogate as well as nursing, discussions with consultants, evaluation of patient's response to treatment, examination of patient, obtaining history from patient or surrogate, ordering and performing treatments and interventions, ordering and review of laboratory studies, ordering and review of radiographic studies, pulse oximetry and re-evaluation of patient's condition.  Results for orders placed or performed during the hospital encounter of 12/01/17  Urinalysis, Routine w reflex microscopic  Result Value Ref Range   Color, Urine STRAW (A) YELLOW   APPearance CLEAR CLEAR   Specific Gravity, Urine 1.026 1.005 - 1.030   pH 5.0 5.0 - 8.0   Glucose, UA >=500 (A) NEGATIVE mg/dL   Hgb urine dipstick NEGATIVE NEGATIVE   Bilirubin Urine NEGATIVE NEGATIVE   Ketones, ur 5 (A) NEGATIVE mg/dL   Protein, ur NEGATIVE NEGATIVE mg/dL   Nitrite NEGATIVE NEGATIVE   Leukocytes, UA NEGATIVE NEGATIVE   RBC / HPF 0-5 0 - 5 RBC/hpf   WBC, UA 0-5 0 - 5 WBC/hpf   Bacteria, UA NONE SEEN NONE SEEN  Comprehensive  metabolic panel  Result Value Ref Range   Sodium 120 (L) 135 - 145 mmol/L   Potassium 4.1 3.5 - 5.1 mmol/L   Chloride 84 (L) 101 - 111 mmol/L   CO2 24 22 - 32 mmol/L   Glucose, Bld 807 (HH) 65 - 99 mg/dL   BUN 34 (H) 6 - 20 mg/dL   Creatinine, Ser 1.98 (H) 0.61 - 1.24 mg/dL   Calcium 8.8 (L) 8.9 - 10.3 mg/dL   Total Protein 7.1 6.5 - 8.1 g/dL   Albumin 3.8 3.5 - 5.0 g/dL   AST 19 15 - 41 U/L   ALT 22 17 - 63 U/L   Alkaline Phosphatase 139 (H) 38 - 126 U/L   Total Bilirubin 0.8 0.3 - 1.2 mg/dL   GFR calc non Af Amer 36 (L) >60 mL/min   GFR calc Af Amer 41 (L) >60 mL/min   Anion gap 12 5 - 15  Lipase, blood  Result Value  Ref Range   Lipase 50 11 - 51 U/L  CBC with Differential  Result Value Ref Range   WBC 9.4 4.0 - 10.5 K/uL   RBC 5.11 4.22 - 5.81 MIL/uL   Hemoglobin 14.1 13.0 - 17.0 g/dL   HCT 42.2 39.0 - 52.0 %   MCV 82.6 78.0 - 100.0 fL   MCH 27.6 26.0 - 34.0 pg   MCHC 33.4 30.0 - 36.0 g/dL   RDW 13.2 11.5 - 15.5 %   Platelets 189 150 - 400 K/uL   Neutrophils Relative % 70 %   Neutro Abs 6.6 1.7 - 7.7 K/uL   Lymphocytes Relative 19 %   Lymphs Abs 1.8 0.7 - 4.0 K/uL   Monocytes Relative 10 %   Monocytes Absolute 0.9 0.1 - 1.0 K/uL   Eosinophils Relative 1 %   Eosinophils Absolute 0.1 0.0 - 0.7 K/uL   Basophils Relative 0 %   Basophils Absolute 0.0 0.0 - 0.1 K/uL  CBG monitoring, ED  Result Value Ref Range   Glucose-Capillary >600 (HH) 65 - 99 mg/dL    2135:  Na corrected to 131 for glucose. IVF bolus given followed by IV insulin gtt. Dx and testing d/w pt and family.  Questions answered.  Verb understanding, agreeable to admit. T/C returned from [facility]. Olevia Bowens, case discussed, including:  HPI, pertinent PM/SHx, VS/PE, dx testing, ED course and treatment:  Agreeable to admit.      Final Clinical Impressions(s) / ED Diagnoses   Final diagnoses:  None    ED Discharge Orders    None       Francine Graven, DO 12/05/17 1737

## 2017-12-01 NOTE — H&P (Signed)
History and Physical    Henry Nguyen DPO:[id number] DOB: 29-Feb-1961 DOA: 12/01/2017  PCP: Lucia Gaskins, MD   Patient coming from: Home.  I have personally briefly reviewed patient's old medical records in [place]  Chief Complaint: Elevated blood glucose.  HPI: Henry Nguyen is a 57 y.o. male with medical history significant of type 2 diabetes, GERD, hypertension who is coming to the emergency after being called by his PCP due to having a blood glucose over 1000 mg/dL and elevated cholesterol.  He has been having polyuria, polydipsia, dizziness and blurred vision for several days.  He also complains of mild abdominal pain associated with nausea and emesis.  He has been having mild rectal bleeding as well for about a week.  He went to see his PCP, who ordered some labs, then called him asking him to come to the emergency department due to having hyperglycemia over 1000 mg/dL.  He says that he has been compliant with his medications regimen.  However, he does not check his CBG often.  He denies fever, chills, sore throat, cough, dyspnea, chest pain, palpitations, PND, orthopnea or pitting edema of the lower extremities.  No heat or cold intolerance.  ED Course: Initial vital signs temperature 98.1 F, pulse 100, respirations 22, blood pressure 97/61 mmHg and O2 sat 100% on room air.  The patient received IV fluid bolus and started on IV insulin.  His lab work shows urinalysis with glucosuria more than 500 and ketonuria oh 5 mg/dL.  CBC was normal.  Sodium 120, potassium 4.1, chloride 84 and CO2 24 mmol/L.  Glucose 807, BUN 34, creatinine 1.98 and calcium 8.8 mg/dL.  His LFTs are normal, except for mildly increased alkaline phosphatase.  Lipase was 15 units/L.  Review of Systems: As per HPI otherwise 10 point review of systems negative.   Past Medical History:  Diagnosis Date  . Diabetes mellitus without complication ([place])   . GERD (gastroesophageal reflux disease)     . Hypertension     History reviewed. No pertinent surgical history.   reports that he has never smoked. He has never used smokeless tobacco. He reports that he has current or past drug history. He reports that he does not drink alcohol.  No Known Allergies  History reviewed. No pertinent family history.   Prior to Admission medications   Medication Sig Start Date End Date Taking? Authorizing Provider  aspirin EC 81 MG tablet Take 81 mg by mouth daily.   Yes [provider]  glipiZIDE (GLUCOTROL XL) 10 MG 24 hr tablet Take 10 mg by mouth daily with breakfast.   Yes [provider]  lisinopril (PRINIVIL,ZESTRIL) 10 MG tablet Take 10 mg by mouth daily.   Yes [provider]  metFORMIN (GLUCOPHAGE) 500 MG tablet Take 500 mg by mouth 2 (two) times daily. 11/02/17  Yes [provider]  omeprazole (PRILOSEC) 40 MG capsule Take 40 mg by mouth daily.   Yes [provider]    Physical Exam: Vitals:   12/01/17 2004 12/01/17 2100 12/01/17 2145 12/01/17 2200  BP:  118/62  (!) 146/85  Pulse:  74 73   Temp:      TempSrc:      SpO2:  98% 99%   Weight: 88.5 kg (195 lb)     Height: 5\' 9"  (1.753 m)       Constitutional: NAD, calm, comfortable Eyes: PERRL, lids and conjunctivae normal ENMT: Mucous membranes are mildly dry. Posterior pharynx clear of any exudate  or lesions. Neck: normal, supple, no masses, no thyromegaly Respiratory: clear to auscultation bilaterally, no wheezing, no crackles. Normal respiratory effort. No accessory muscle use.  Cardiovascular: Regular rate and rhythm, no murmurs / rubs / gallops. No extremity edema. 2+ pedal pulses. No carotid bruits.  Abdomen: Soft, no tenderness, no masses palpated. No hepatosplenomegaly. Bowel sounds positive.  Musculoskeletal: no clubbing / cyanosis. No joint deformity upper and lower extremities. Good ROM, no contractures. Normal muscle tone.  Skin: no rashes, lesions, ulcers limited  dermatological examination Neurologic: CN 2-12 grossly intact. Sensation intact, DTR normal. Strength 5/5 in all 4.  Psychiatric: Normal judgment and insight. Alert and oriented x 3. Normal mood.    Labs on Admission: I have personally reviewed following labs and imaging studies  CBC: Recent Labs  Lab 12/01/17 2029  WBC 9.4  NEUTROABS 6.6  HGB 14.1  HCT 42.2  MCV 82.6  PLT 341   Basic Metabolic Panel: Recent Labs  Lab 12/01/17 2029  NA 120*  K 4.1  CL 84*  CO2 24  GLUCOSE 807*  BUN 34*  CREATININE 1.98*  CALCIUM 8.8*   GFR: Estimated Creatinine Clearance: 45.3 mL/min (A) (by C-G formula based on SCr of 1.98 mg/dL (H)). Liver Function Tests: Recent Labs  Lab 12/01/17 2029  AST 19  ALT 22  ALKPHOS 139*  BILITOT 0.8  PROT 7.1  ALBUMIN 3.8   Recent Labs  Lab 12/01/17 2029  LIPASE 50   No results for input(s): AMMONIA in the last 168 hours. Coagulation Profile: No results for input(s): INR, PROTIME in the last 168 hours. Cardiac Enzymes: No results for input(s): CKTOTAL, CKMB, CKMBINDEX, TROPONINI in the last 168 hours. BNP (last 3 results) No results for input(s): PROBNP in the last 8760 hours. HbA1C: No results for input(s): HGBA1C in the last 72 hours. CBG: Recent Labs  Lab 12/01/17 2008  GLUCAP >600*   Lipid Profile: No results for input(s): CHOL, HDL, LDLCALC, TRIG, CHOLHDL, LDLDIRECT in the last 72 hours. Thyroid Function Tests: No results for input(s): TSH, T4TOTAL, FREET4, T3FREE, THYROIDAB in the last 72 hours. Anemia Panel: No results for input(s): VITAMINB12, FOLATE, FERRITIN, TIBC, IRON, RETICCTPCT in the last 72 hours. Urine analysis:    Component Value Date/Time   COLORURINE STRAW (A) 12/01/2017 2007   APPEARANCEUR CLEAR 12/01/2017 2007   LABSPEC 1.026 12/01/2017 2007   PHURINE 5.0 12/01/2017 2007   GLUCOSEU >=500 (A) 12/01/2017 2007   HGBUR NEGATIVE 12/01/2017 2007   BILIRUBINUR NEGATIVE 12/01/2017 2007   KETONESUR 5 (A)  12/01/2017 2007   PROTEINUR NEGATIVE 12/01/2017 2007   NITRITE NEGATIVE 12/01/2017 2007   LEUKOCYTESUR NEGATIVE 12/01/2017 2007    Radiological Exams on Admission: No results found.  EKG: Independently reviewed. Vent. rate 88 BPM PR interval 136 ms QRS duration 82 ms QT/QTc 362/438 ms P-R-T axes 61 -31 -16 Sinus rhythm with Premature atrial complexes Left axis deviation Moderate voltage criteria for LVH, may be normal variant Nonspecific T wave abnormality Abnormal ECG  Assessment/Plan Principal Problem:   Diabetic hyperosmolar non-ketotic state ([place]) Admit to stepdown/inpatient. Continue IV fluids. Continue insulin infusion. Monitor CBG, electrolytes and renal function. Check hemoglobin A1c.  Active Problems:   Hyponatremia This is pseudo-hyponatremia. Corrected sodium is 137 mmol/L. Continue IV hydration.   Follow-up sodium level.    AKI (acute kidney injury) ([place]) Continue IV fluids. Hold lisinopril and metformin.. Follow-up renal function and electrolytes in a.m.    Abnormal EKG Given risk factors and family history will check echocardiogram  Hypertension Hold lisinopril due to AKI. Monitor blood pressure renal function and electrolytes.Marland Kitchen    GERD (gastroesophageal reflux disease) Protonix 40 mg p.o. daily  DVT prophylaxis:.  SCDs. Code Status: Full code. Family Communication: * Disposition Plan: Admit for hyperosmolar state treatment. Consults called: Routine gastroenterology consult. Admission status: Inpatient/stepdown.   Reubin Milan MD [facility] Pager [phone].  If 7PM-7AM, please contact night-coverage [email or web address] Password [facility]  12/01/2017, 10:31 PM

## 2017-12-02 ENCOUNTER — Encounter (HOSPITAL_COMMUNITY): Payer: Self-pay | Admitting: Internal Medicine

## 2017-12-02 LAB — BASIC METABOLIC PANEL
Anion gap: 8 (ref 5–15)
Anion gap: 8 (ref 5–15)
Anion gap: 9 (ref 5–15)
BUN: 30 mg/dL — ABNORMAL HIGH (ref 6–20)
BUN: 30 mg/dL — ABNORMAL HIGH (ref 6–20)
BUN: 28 mg/dL — ABNORMAL HIGH (ref 6–20)
CO2: 27 mmol/L (ref 22–32)
CO2: 26 mmol/L (ref 22–32)
CO2: 27 mmol/L (ref 22–32)
Calcium: 8.6 mg/dL — ABNORMAL LOW (ref 8.9–10.3)
Calcium: 8.4 mg/dL — ABNORMAL LOW (ref 8.9–10.3)
Calcium: 8.8 mg/dL — ABNORMAL LOW (ref 8.9–10.3)
Chloride: 94 mmol/L — ABNORMAL LOW (ref 101–111)
Chloride: 100 mmol/L — ABNORMAL LOW (ref 101–111)
Chloride: 99 mmol/L — ABNORMAL LOW (ref 101–111)
Creatinine, Ser: 1.68 mg/dL — ABNORMAL HIGH (ref 0.61–1.24)
Creatinine, Ser: 1.4 mg/dL — ABNORMAL HIGH (ref 0.61–1.24)
Creatinine, Ser: 1.26 mg/dL — ABNORMAL HIGH (ref 0.61–1.24)
GFR calc Af Amer: 51 mL/min — ABNORMAL LOW (ref >60)
GFR calc Af Amer: >60 mL/min (ref >60)
GFR calc Af Amer: >60 mL/min (ref >60)
GFR calc non Af Amer: 44 mL/min — ABNORMAL LOW (ref >60)
GFR calc non Af Amer: 54 mL/min — ABNORMAL LOW (ref >60)
GFR calc non Af Amer: >60 mL/min (ref >60)
Glucose, Bld: 488 mg/dL — ABNORMAL HIGH (ref 65–99)
Glucose, Bld: 198 mg/dL — ABNORMAL HIGH (ref 65–99)
Glucose, Bld: 88 mg/dL (ref 65–99)
Potassium: 3.7 mmol/L (ref 3.5–5.1)
Potassium: 3.5 mmol/L (ref 3.5–5.1)
Potassium: 3.8 mmol/L (ref 3.5–5.1)
Sodium: 129 mmol/L — ABNORMAL LOW (ref 135–145)
Sodium: 134 mmol/L — ABNORMAL LOW (ref 135–145)
Sodium: 135 mmol/L (ref 135–145)

## 2017-12-02 LAB — CBC
HCT: 39.7 % (ref 39.0–52.0)
Hemoglobin: 13.1 g/dL (ref 13.0–17.0)
MCH: 27.3 pg (ref 26.0–34.0)
MCHC: 33 g/dL (ref 30.0–36.0)
MCV: 82.7 fL (ref 78.0–100.0)
Platelets: 154 10*3/uL (ref 150–400)
RBC: 4.8 MIL/uL (ref 4.22–5.81)
RDW: 13 % (ref 11.5–15.5)
WBC: 8.1 10*3/uL (ref 4.0–10.5)

## 2017-12-02 LAB — GLUCOSE, CAPILLARY
Glucose-Capillary: 108 mg/dL — ABNORMAL HIGH (ref 65–99)
Glucose-Capillary: 109 mg/dL — ABNORMAL HIGH (ref 65–99)
Glucose-Capillary: 147 mg/dL — ABNORMAL HIGH (ref 65–99)
Glucose-Capillary: 147 mg/dL — ABNORMAL HIGH (ref 65–99)
Glucose-Capillary: 154 mg/dL — ABNORMAL HIGH (ref 65–99)
Glucose-Capillary: 164 mg/dL — ABNORMAL HIGH (ref 65–99)
Glucose-Capillary: 188 mg/dL — ABNORMAL HIGH (ref 65–99)
Glucose-Capillary: 201 mg/dL — ABNORMAL HIGH (ref 65–99)
Glucose-Capillary: 206 mg/dL — ABNORMAL HIGH (ref 65–99)
Glucose-Capillary: 206 mg/dL — ABNORMAL HIGH (ref 65–99)
Glucose-Capillary: 226 mg/dL — ABNORMAL HIGH (ref 65–99)
Glucose-Capillary: 230 mg/dL — ABNORMAL HIGH (ref 65–99)
Glucose-Capillary: 278 mg/dL — ABNORMAL HIGH (ref 65–99)
Glucose-Capillary: 322 mg/dL — ABNORMAL HIGH (ref 65–99)
Glucose-Capillary: 378 mg/dL — ABNORMAL HIGH (ref 65–99)
Glucose-Capillary: 476 mg/dL — ABNORMAL HIGH (ref 65–99)
Glucose-Capillary: 565 mg/dL (ref 65–99)

## 2017-12-02 LAB — MAGNESIUM: Magnesium: 2 mg/dL (ref 1.7–2.4)

## 2017-12-02 LAB — VITAMIN B12: Vitamin B-12: 373 pg/mL (ref 180–914)

## 2017-12-02 LAB — FOLATE: Folate: 6.4 ng/mL (ref >5.9)

## 2017-12-02 LAB — MRSA PCR SCREENING: MRSA by PCR: NEGATIVE

## 2017-12-02 LAB — PHOSPHORUS: Phosphorus: 2.2 mg/dL — ABNORMAL LOW (ref 2.5–4.6)

## 2017-12-02 LAB — SEDIMENTATION RATE: Sed Rate: 8 mm/hr (ref 0–16)

## 2017-12-02 LAB — TSH: TSH: 0.48 u[IU]/mL (ref 0.350–4.500)

## 2017-12-02 MED ORDER — INSULIN GLARGINE 100 UNIT/ML ~~LOC~~ SOLN
30.0000 [IU] | Freq: Every day | SUBCUTANEOUS | Status: DC
  Administered 2017-12-02: 30 [IU] via SUBCUTANEOUS
  Filled 2017-12-02 (×2): qty 0.3

## 2017-12-02 MED ORDER — INSULIN ASPART 100 UNIT/ML ~~LOC~~ SOLN
0.0000 [IU] | Freq: Three times a day (TID) | SUBCUTANEOUS | Status: DC
  Administered 2017-12-02: 5 [IU] via SUBCUTANEOUS
  Administered 2017-12-03: 15 [IU] via SUBCUTANEOUS
  Administered 2017-12-03: 8 [IU] via SUBCUTANEOUS

## 2017-12-02 MED ORDER — LIVING WELL WITH DIABETES BOOK
Freq: Once | Status: AC
  Administered 2017-12-02: 15:00:00
  Filled 2017-12-02: qty 1

## 2017-12-02 MED ORDER — INSULIN ASPART 100 UNIT/ML ~~LOC~~ SOLN
0.0000 [IU] | Freq: Every day | SUBCUTANEOUS | Status: DC
  Administered 2017-12-02: 4 [IU] via SUBCUTANEOUS

## 2017-12-02 MED ORDER — INSULIN GLARGINE 100 UNIT/ML ~~LOC~~ SOLN
20.0000 [IU] | Freq: Every day | SUBCUTANEOUS | Status: DC
  Administered 2017-12-03: 20 [IU] via SUBCUTANEOUS
  Filled 2017-12-02 (×2): qty 0.2

## 2017-12-02 MED ORDER — INSULIN STARTER KIT- SYRINGES (ENGLISH)
1.0000 | Freq: Once | Status: AC
  Administered 2017-12-02: 1
  Filled 2017-12-02: qty 1

## 2017-12-02 NOTE — Progress Notes (Signed)
Patient with nonketotic hyperosmolar state glucose is now normalized patient uninsured wishes to go home he is here to learn about insulin will give insulin syringes and bottles to have drop in a glucometer so he can control this on his own at home and short time. Garner Dullea KZS:[id number] DOB: 1960/09/04 DOA: 12/01/2017 PCP: Lucia Gaskins, MD   Physical Exam: Blood pressure (!) 147/92, pulse 98, temperature 98.7 F (37.1 C), temperature source Oral, resp. rate 19, height 5\' 9"  (1.753 m), weight 88.5 kg (195 lb 1.7 oz), SpO2 100 %.  Lungs clear to A&P no rales rhonchi heart regular rhythm no S3-S4 no heaves or rubs   Investigations:  Recent Results (from the past 240 hour(s))  MRSA PCR Screening     Status: None   Collection Time: 12/01/17 10:25 PM  Result Value Ref Range Status   MRSA by PCR NEGATIVE NEGATIVE Final    Comment:        The GeneXpert MRSA Assay (FDA approved for NASAL specimens only), is one component of a comprehensive MRSA colonization surveillance program. It is not intended to diagnose MRSA infection nor to guide or monitor treatment for MRSA infections. Performed at [facility], [address] [place], [place] [address]      Basic Metabolic Panel: Recent Labs    12/02/17 0043 12/02/17 0429 12/02/17 0831  NA 129* 134* 135  K 3.7 3.5 3.8  CL 94* 100* 99*  CO2 27 26 27   GLUCOSE 488* 198* 88  BUN 30* 30* 28*  CREATININE 1.68* 1.40* 1.26*  CALCIUM 8.6* 8.4* 8.8*  MG 2.0  --   --   PHOS 2.2*  --   --    Liver Function Tests: Recent Labs    12/01/17 2029  AST 19  ALT 22  ALKPHOS 139*  BILITOT 0.8  PROT 7.1  ALBUMIN 3.8     CBC: Recent Labs    12/01/17 2029  WBC 9.4  NEUTROABS 6.6  HGB 14.1  HCT 42.2  MCV 82.6  PLT 189    No results found.    Medications:  Impression:  Principal Problem:   Diabetic hyperosmolar non-ketotic state ([place]) Active Problems:   Hypertension   GERD (gastroesophageal reflux disease)  Hyponatremia   AKI (acute kidney injury) ([place])   Rectal bleeding     Plan: Diabetic teaching and literature allow patient to stick on fingers with glucometer provided here and allow patient to drop Lantus and Humalog with old-fashioned syringe as this is the only insulin he can afford being uninsured  Consultants:     Procedures   Antibiotics:           Time spent: 30 minutes   LOS: 1 day   Rmani Kellogg M   12/02/2017, 12:25 PM

## 2017-12-02 NOTE — Progress Notes (Signed)
Dr. Cindie Laroche came by to talk with patient about need for insulin at home and learning as much as he can in the hospital so he can manage his DM at home and remain compliant through out regimen. Pt verbally understood and was eager to learn how to give himself the insulin injection. Glucometer has been ordered and pt is uninsured and may benefit from 70/30 pen or insulin syringes / bottles. Spoke to [place], [place] coordinator and she will be by [facility] tomorrow 6/13 to visit with patient and also coordinate with Dr. Cindie Laroche about what is the best kit for patient to be sent home with and instructed on.   Callin Ashe Rica Mote, RN 12/02/17 1:05 PM

## 2017-12-02 NOTE — Progress Notes (Signed)
Pt kit and living with Diabetes book given to patient and went over the booklets and how to draw up insulin using vial and syringes. Pt is very eager and excited about learning and seems to know the importance and is accepting this new lifestyle and states he will and can do this and that he has 2 nieces that think the world of him and he wants to be around for them. He stuck himself once and also checked his sugar and did a very good job. He is aware that when he goes to [facility] unit the nurses will educate him there and then the DM coordinator will be here to educated him even more. He is reading his diabetes book at this time and is very eager to know and learn. He is saddened that he cant get the pen vs vial and syringes which Judeen Hammans, DM coordinator is trying to work something out for him.  Kienna Moncada Rica Mote, RN

## 2017-12-02 NOTE — Progress Notes (Signed)
Patient visited by Diabetes Coordinator, Lucious Groves, CDE regarding insulin start and DM management. Due to lack of insurance, he will need affordable insulin regimen.  Per RN note he is interested in Novolin 70/30 pen which can be purchased from [place] for 42.88$ per box which is 1500 units.    May consider d/c of Lantus and start 70/30 12 units bid.  This will likely need to be increased/ titrated.  Will follow. Per RN patient very interested in improved glycemic control.    Thanks,  Adah Perl, RN, BC-ADM Inpatient Diabetes Coordinator Pager [phone] (8a-5p)

## 2017-12-02 NOTE — Plan of Care (Signed)
Diabetes Education  No results found for: HGBA1C  Patient Diet Background: Pt is on food stamps and, through context of conversation, lives with his mother who cooks Tuesday, Thursday, and Sunday.  Monday, Wednesday, and Friday pt and family eat fast food for both lunch and supper including [employer], McDonald's, [employer], [employer], and others.  Typical diet from fast food places include: Whopper, 1/4 lb burger, corned beef, and fries as a side.  At home meals include [place], meat loaf, and other variety of foods.  Pt like pears and cherries.  Typical drinks include regular sodas, sweetened tea, and fruit juice.  Education: RD provided "Carbohydrate Counting for People with Diabetes" handout from the Academy of Nutrition and Dietetics. Discussed different food groups and their effects on blood sugar, emphasizing carbohydrate-containing foods: breads (hamburger buns), pasta, starchy vegetables (fries), fruit, and desserts (candy and cakes). Provided list of carbohydrates and recommended serving sizes of common foods: discussed how to read carbohydrates on a label and calculate the number of servings of carbohydrates are in different foods both from a label and based on portion size.  Provided sample list of foods with carbohydrates and how much of each food equals one serving of CHOs.  Discussed with pt, who said family was willing to help him, that many of the restaurants he usually eats at have food label information online.    Discussed importance of controlled and consistent carbohydrate intake throughout the day. Provided examples of ways to balance meals/snacks and encouraged intake of high-fiber, whole grain complex carbohydrates. Teach back method used.  Discussed goals with patient, had him repeat back to RD intern, and wrote goals down on handout given to patient.    Goals included: 1) Cut out sugary drinks including regular soda, sweetened tea, and juice.  Replace with sugar free  options. 2) Cut out candy and cakes. 3) Replace fries for side salad when eating out at fast food restaurants.  Expect HIGH/EXCELLENT compliance.  Patient was very open to learning about how to manage his diabetes through his diet.  Says he "wants to live".  Very pleasant to talk to and to educate.   Body mass index is 28.81 kg/m. Pt meets criteria for overweight classification based on current BMI.  Current diet order is CHO MOD, patient is consuming approximately 100% of meals at this time. Labs and medications reviewed. No further nutrition interventions warranted at this time.  If additional nutrition issues arise, please re-consult RD.

## 2017-12-02 NOTE — Progress Notes (Signed)
Inpatient Diabetes Program Recommendations  AACE/ADA: New Consensus Statement on Inpatient Glycemic Control (2015)  Target Ranges:  Prepandial:   less than 140 mg/dL      Peak postprandial:   less than 180 mg/dL (1-2 hours)      Critically ill patients:  140 - 180 mg/dL   Lab Results  Component Value Date   GLUCAP 147 (H) 12/02/2017   Home DM meds: glipizide, metformin .  Currently being transitioned off insulin drip.  Pt will need insulin instruction prior to discharge. Spoke with nurse.  She is beginning insulin instruction.  Plan to see pt on 6/13.  [place], CDE. M.Ed. Pager [phone] Inpatient Diabetes Coordinator

## 2017-12-03 LAB — BASIC METABOLIC PANEL
Anion gap: 5 (ref 5–15)
BUN: 20 mg/dL (ref 6–20)
CO2: 28 mmol/L (ref 22–32)
Calcium: 8.5 mg/dL — ABNORMAL LOW (ref 8.9–10.3)
Chloride: 104 mmol/L (ref 101–111)
Creatinine, Ser: 1.11 mg/dL (ref 0.61–1.24)
GFR calc Af Amer: >60 mL/min (ref >60)
GFR calc non Af Amer: >60 mL/min (ref >60)
Glucose, Bld: 274 mg/dL — ABNORMAL HIGH (ref 65–99)
Potassium: 3.6 mmol/L (ref 3.5–5.1)
Sodium: 137 mmol/L (ref 135–145)

## 2017-12-03 LAB — GLUCOSE, CAPILLARY
Glucose-Capillary: 263 mg/dL — ABNORMAL HIGH (ref 65–99)
Glucose-Capillary: 370 mg/dL — ABNORMAL HIGH (ref 65–99)

## 2017-12-03 LAB — RPR: RPR Ser Ql: NONREACTIVE

## 2017-12-03 LAB — HIV ANTIBODY (ROUTINE TESTING W REFLEX): HIV Screen 4th Generation wRfx: NONREACTIVE

## 2017-12-03 MED ORDER — LISINOPRIL 20 MG PO TABS: 20.0000 mg | ORAL_TABLET | Freq: Every day | ORAL | 3 refills | Status: DC

## 2017-12-03 MED ORDER — PRAVASTATIN SODIUM 40 MG PO TABS: 40.0000 mg | ORAL_TABLET | Freq: Every evening | ORAL | 11 refills | Status: DC

## 2017-12-03 MED ORDER — INSULIN ASPART PROT & ASPART (70-30 MIX) 100 UNIT/ML ~~LOC~~ SUSP
25.0000 [IU] | Freq: Two times a day (BID) | SUBCUTANEOUS | Status: DC
  Filled 2017-12-03: qty 10

## 2017-12-03 MED ORDER — INSULIN ASPART PROT & ASPART (70-30 MIX) 100 UNIT/ML ~~LOC~~ SUSP: 25.0000 [IU] | Freq: Two times a day (BID) | SUBCUTANEOUS | 11 refills | Status: DC

## 2017-12-03 MED ORDER — INSULIN GLARGINE 100 UNIT/ML ~~LOC~~ SOLN: 28.0000 [IU] | Freq: Every day | SUBCUTANEOUS | Status: DC

## 2017-12-03 MED ORDER — LISINOPRIL 10 MG PO TABS
20.0000 mg | ORAL_TABLET | Freq: Every day | ORAL | Status: DC
Start: 2017-12-03 — End: 2017-12-03
  Administered 2017-12-03: 20 mg via ORAL
  Filled 2017-12-03: qty 2

## 2017-12-03 NOTE — Discharge Summary (Signed)
Physician Discharge Summary  Henry Nguyen WUX:[id number] DOB: 1960-09-11 DOA: 12/01/2017  PCP: Henry Gaskins, MD  Admit date: 12/01/2017 Discharge date: 12/03/2017   Recommendations for Outpatient Follow-up: Patient is instructed to take 70/30 insulin 25 units before meals twice daily before breakfast and supper daily.  Likewise instructed to take Pravachol 40 mg p.o. daily and also to take lisinopril 20 mg p.o. daily with aspirin 81 mg p.o. daily and to follow-up in my office within 1 week's time to assess glycemic control renal function electrolytes and hemodynamics  Discharge Diagnoses:  Principal Problem:   Diabetic hyperosmolar non-ketotic state ([place]) Active Problems:   Hypertension   GERD (gastroesophageal reflux disease)   Hyponatremia   AKI (acute kidney injury) [facility])   Rectal bleeding   Discharge Condition: Good  Filed Weights   12/01/17 2004 12/01/17 2237 12/02/17 0500  Weight: 88.5 kg (195 lb) 88.5 kg (195 lb 1.7 oz) 88.5 kg (195 lb 1.7 oz)    History of present illness:  Patient is a 57 year old black male history of type 2 diabetes who was admitted with a glucose over thousand with nonketotic hyperosmolar state he she had acute kidney injury which resolved and improved with fluid resuscitation she likewise had pseudohyponatremia upon admission which was rectified with medical management we have placed on Glucomander protocol glucose was rectified within 12 hours and switched over to insulin after putting the patient on long-term Lantus insulin was found that he had no health insurance he wanted to go home we sought the advice of diabetic teaching coordinator on the cheapest form of insulin she decided upon Novolin 70/30 25 units before meals twice daily he likewise had lisinopril increased from 10 to 20 mg daily and then Pravachol added 40 mg p.o. daily in addition to aspirin 81 mg/day he had fairly decent glycemic control prior to discharge we will follow-up in  my office within 1 week's time to assess glycemic control hemodynamics electrolytes and renal function  Hospital Course:  See HPI above  Procedures:    Consultations:  Diabetic teaching coordinator  Discharge Instructions  Discharge Instructions    Discharge instructions   Complete by:  As directed    Discharge instructions   Complete by:  As directed    Discharge patient   Complete by:  As directed    Discharge disposition:  01-Home or Self Care   Discharge patient date:  12/03/2017     Allergies as of 12/03/2017   No Known Allergies     Medication List    STOP taking these medications   glipiZIDE 10 MG 24 hr tablet Commonly known as:  GLUCOTROL XL   metFORMIN 500 MG tablet Commonly known as:  GLUCOPHAGE   omeprazole 40 MG capsule Commonly known as:  PRILOSEC     TAKE these medications   aspirin EC 81 MG tablet Take 81 mg by mouth daily.   insulin aspart protamine- aspart (70-30) 100 UNIT/ML injection Commonly known as:  NOVOLOG MIX 70/30 Inject 0.25 mLs (25 Units total) into the skin 2 (two) times daily with a meal.   lisinopril 20 MG tablet Commonly known as:  PRINIVIL,ZESTRIL Take 1 tablet (20 mg total) by mouth daily. What changed:    medication strength  how much to take   pravastatin 40 MG tablet Commonly known as:  PRAVACHOL Take 1 tablet (40 mg total) by mouth every evening.            Durable Medical Equipment  (From admission, onward)  Start     Ordered   12/02/17 1130  DME Glucometer  Once     12/02/17 1130     No Known Allergies    The results of significant diagnostics from this hospitalization (including imaging, microbiology, ancillary and laboratory) are listed below for reference.    Significant Diagnostic Studies: No results found.  Microbiology: Recent Results (from the past 240 hour(s))  MRSA PCR Screening     Status: None   Collection Time: 12/01/17 10:25 PM  Result Value Ref Range Status   MRSA by  PCR NEGATIVE NEGATIVE Final    Comment:        The GeneXpert MRSA Assay (FDA approved for NASAL specimens only), is one component of a comprehensive MRSA colonization surveillance program. It is not intended to diagnose MRSA infection nor to guide or monitor treatment for MRSA infections. Performed at [facility], [address] [name], [place] [address]      Labs: Basic Metabolic Panel: Recent Labs  Lab 12/01/17 2029 12/02/17 0043 12/02/17 0429 12/02/17 0831 12/03/17 0538  NA 120* 129* 134* 135 137  K 4.1 3.7 3.5 3.8 3.6  CL 84* 94* 100* 99* 104  CO2 24 27 26 27 28   GLUCOSE 807* 488* 198* 88 274*  BUN 34* 30* 30* 28* 20  CREATININE 1.98* 1.68* 1.40* 1.26* 1.11  CALCIUM 8.8* 8.6* 8.4* 8.8* 8.5*  MG  --  2.0  --   --   --   PHOS  --  2.2*  --   --   --    Liver Function Tests: Recent Labs  Lab 12/01/17 2029  AST 19  ALT 22  ALKPHOS 139*  BILITOT 0.8  PROT 7.1  ALBUMIN 3.8   Recent Labs  Lab 12/01/17 2029  LIPASE 50   No results for input(s): AMMONIA in the last 168 hours. CBC: Recent Labs  Lab 12/01/17 2029 12/02/17 1226  WBC 9.4 8.1  NEUTROABS 6.6  --   HGB 14.1 13.1  HCT 42.2 39.7  MCV 82.6 82.7  PLT 189 154   Cardiac Enzymes: No results for input(s): CKTOTAL, CKMB, CKMBINDEX, TROPONINI in the last 168 hours. BNP: BNP (last 3 results) No results for input(s): BNP in the last 8760 hours.  ProBNP (last 3 results) No results for input(s): PROBNP in the last 8760 hours.  CBG: Recent Labs  Lab 12/02/17 1510 12/02/17 1712 12/02/17 2118 12/03/17 0733 12/03/17 1108  GLUCAP 226* 230* 322* 263* 370*       Signed:  Inell Mimbs M   Pager: [phone] 12/03/2017, 2:10 PM

## 2017-12-03 NOTE — Care Management Note (Signed)
Case Management Note  Patient Details  Name: Henry Nguyen MRN: [id number] Date of Birth: 10-14-1960  Subjective/Objective:       Admitted with uncontrolled DM. Pt from home, uninsured, unemployed, has PCP. Pt gets medications assistance through [facility].              Action/Plan: DC home today on insulin (new). Pt has spoken with DC. Two RX have been requested one pt will take to [facility] and get insulin filled today and the other CM will fax to [place] [facility] to begin approval for assistance.   Expected Discharge Date:       12/03/2017           Expected Discharge Plan:  Home/Self Care  Discharge planning Services  [place] Program, Medication Assistance  Post Acute Care Choice:  NA Choice offered to:  NA  Status of Service:  Completed, signed off  If discussed at Long Length of Stay Meetings, dates discussed:    Additional Comments:  Sherald Barge, RN 12/03/2017, 1:23 PM

## 2017-12-03 NOTE — Progress Notes (Signed)
IV removed, 2x2 gauze and paper tape applied to site, patient tolerated well. Reviewed AVS with patient who verbalized understanding.  Patient transported home mother.

## 2017-12-03 NOTE — Progress Notes (Addendum)
Inpatient Diabetes Program Recommendations  AACE/ADA: New Consensus Statement on Inpatient Glycemic Control (2019)  Target Ranges:  Prepandial:   less than 140 mg/dL      Peak postprandial:   less than 180 mg/dL (1-2 hours)      Critically ill patients:  140 - 180 mg/dL  Results for Henry Nguyen, Henry Nguyen (MRN [id number]) as of 12/03/2017 09:37  Ref. Range 12/02/2017 08:00 12/02/2017 09:07 12/02/2017 10:08 12/02/2017 11:11 12/02/2017 12:02 12/02/2017 13:06 12/02/2017 15:10 12/02/2017 17:12 12/02/2017 21:18 12/03/2017 07:33  Glucose-Capillary Latest Ref Range: 65 - 99 mg/dL 108 (H) 147 (H) 109 (H) 147 (H)  Lantus 30 units 154 (H) 206 (H) 226 (H) 230 (H)  Novolog 5 units 322 (H)  Novolog 4 units 263 (H)  Novolog 8 units  Lantus 20 units    Review of Glycemic Control  Diabetes history: DM2 Outpatient Diabetes medications: Glipizide XL 10 mg daily, Metformin 500 mg BID Current orders for Inpatient glycemic control: Lantus 20 units daily, Novolog 0-15 units TID with meals, Novolog 0-5 units QHS  Inpatient Diabetes Program Recommendations:  Insulin - Basal: Please consider discontinuing Lantus and ordering 70/30 25 units BID (dose will provide a total of 35 units for basal needs and 15 units for meal coverage per day) starting with supper today.  NOTE: Patient was seen by S. Nicole Kindred, RN, CDE, Inpatient Diabetes Coordinator on 12/02/17 and patient requested to use insulin pens. NOVOLIN 70/30 insulin pens can be purchased at [facility] for $42.88 per box which contains 5 insulin pens (each pen has 300 units of insulin).  Therefore, would recommend discontinuing Lantus and ordering 70/30 25 units BID starting with supper today.  Addendum 12/03/17_0 :15-Met with patient again to follow up about DM control and insulin. Patient states that he has been working with RNs and has been giving himself insulin injections with syringe. Patient prefers to use insulin pens. Patient usually gets medications from [facility] and  it takes a few days from him to get the medications once he calls them in. Explained that he will need to get the insulin filled right away. Patient states that his mother will help him pay for anything that he is going to have to pay for at time of discharge. Noted patient will be given MATCH voucher for 30 day supply and CM will fax prescriptions to [facility].  Patient states he has read all educational material on DM and insulin multiple times.  Reviewed contents of insulin flexpen starter kit. Reviewed all steps of insulin pen including attachment of needle, 2-unit air shot, dialing up dose, giving injection, removing needle, disposal of sharps, storage of unused insulin, disposal of insulin etc. Patient able to provide successful return demonstration multiple times. Discussed NOVOLIN 70/30 in in detail and how it is usually given and explained MD will note on discharge paperwork the dose he needs to take.  Asked patient to get insulin pen needles at [facility] and to get glucose tablets.  Discussed hypoglycemia along with proper treatment. Patient reports that he has a glucometer and testing supplies at home. Asked patient to check glucose 4 times per day and keep a log book. Patient verbalized understanding of information discussed and states that he has no questions at this time.  Patient plans to follow up with Dr. Cindie Laroche. MD to give patient Rxs for NOVOLIN 70/30 insulin pens and insulin pen needles.  Thanks, Barnie Alderman, RN, MSN, CDE Diabetes Coordinator Inpatient Diabetes Program [phone] (Team Pager from 8am to 5pm)

## 2017-12-04 LAB — HEMOGLOBIN A1C
Hgb A1c MFr Bld: >15.5 % — ABNORMAL HIGH (ref 4.8–5.6)
Mean Plasma Glucose: >398 mg/dL

## 2018-04-01 ENCOUNTER — Encounter: Payer: Self-pay | Admitting: Gastroenterology

## 2018-05-10 ENCOUNTER — Ambulatory Visit: Payer: Self-pay

## 2018-05-10 ENCOUNTER — Ambulatory Visit (INDEPENDENT_AMBULATORY_CARE_PROVIDER_SITE_OTHER): Payer: Self-pay

## 2018-05-10 DIAGNOSIS — Z1211 Encounter for screening for malignant neoplasm of colon: Secondary | ICD-10-CM

## 2018-05-10 NOTE — Patient Instructions (Signed)
Henry Nguyen  11-Jan-1961 MRN: [id number]     Procedure Date: 07/09/18 Time to register: 10:15am Place to register: [name] Procedure Time: 11:15am Scheduled provider: Barney Drain, MD    PREPARATION FOR COLONOSCOPY WITH SUPREP BOWEL PREP KIT  Note: Suprep Bowel Prep Kit is a split-dose (2day) regimen. Consumption of BOTH 6-ounce bottles is required for a complete prep.  Please notify us immediately if you are diabetic, take iron supplements, or if you are on Coumadin or any other blood thinners.  Please hold the following medications: I will mail you a letter with this information.                                                                                                                                                   1 DAY BEFORE PROCEDURE:  DATE: 07/08/18   DAY: Thursday  clear liquids the entire day - NO SOLID FOOD.   Diabetic medications adjustments for today: see letter  At 6:00pm: Complete steps 1 through 4 below, using ONE (1) 6-ounce bottle, before going to bed. Step 1:  Pour ONE (1) 6-ounce bottle of SUPREP liquid into the mixing container.  Step 2:  Add cool drinking water to the 16 ounce line on the container and mix.  Note: Dilute the solution concentrate as directed prior to use. Step 3:  DRINK ALL the liquid in the container. Step 4:  You MUST drink an additional two (2) or more 16 ounce containers of water over the next one (1) hour.   Continue clear liquids.  DAY OF PROCEDURE:   DATE: 07/09/18   DAY: Friday If you take medications for your heart, blood pressure, or breathing, you may take these medications.  Diabetic medications adjustments for today:see letter  5 hours before your procedure at :6:15am Step 1:  Pour ONE (1) 6-ounce bottle of SUPREP liquid into the mixing container.  Step 2:  Add cool drinking water to the 16 ounce line on the container and mix.  Note: Dilute the solution concentrate as directed prior to use. Step 3:   DRINK ALL the liquid in the container. Step 4:  You MUST drink an additional two (2) or more 16 ounce containers of water over the next one (1) hour. You MUST complete the final glass of water at least 3 hours before your colonoscopy.   Nothing by mouth past 8:15am  You may take your morning medications with sip of water unless we have instructed otherwise.    Please see below for Dietary Information.  CLEAR LIQUIDS INCLUDE:  Water Jello (NOT red in color)   Ice Popsicles (NOT red in color)   Tea (sugar ok, no milk/cream) Powdered fruit flavored drinks  Coffee (sugar ok, no milk/cream) Gatorade/ Lemonade/ Kool-Aid  (NOT red in color)   Juice: apple, white grape, white cranberry Soft  drinks  Clear bullion, consomme, broth (fat free beef/chicken/vegetable)  Carbonated beverages (any kind)  Strained chicken noodle soup Hard Candy   Remember: Clear liquids are liquids that will allow you to see your fingers on the other side of a clear glass. Be sure liquids are NOT red in color, and not cloudy, but CLEAR.  DO NOT EAT OR DRINK ANY OF THE FOLLOWING:  Dairy products of any kind   Cranberry juice Tomato juice / V8 juice   Grapefruit juice Orange juice     Red grape juice  Do not eat any solid foods, including such foods as: cereal, oatmeal, yogurt, fruits, vegetables, creamed soups, eggs, bread, crackers, pureed foods in a blender, etc.   HELPFUL HINTS FOR DRINKING PREP SOLUTION:   Make sure prep is extremely cold. Mix and refrigerate the the morning of the prep. You may also put in the freezer.   You may try mixing some Crystal Light or Country Time Lemonade if you prefer. Mix in small amounts; add more if necessary.  Try drinking through a straw  Rinse mouth with water or a mouthwash between glasses, to remove after-taste.  Try sipping on a cold beverage /ice/ popsicles between glasses of prep.  Place a piece of sugar-free hard candy in mouth between glasses.  If you become  nauseated, try consuming smaller amounts, or stretch out the time between glasses. Stop for 30-60 minutes, then slowly start back drinking.     OTHER INSTRUCTIONS  You will need a responsible adult at least 58 years of age to accompany you and drive you home. This person must remain in the waiting room during your procedure. The hospital will cancel your procedure if you do not have a responsible adult with you.   1. Wear loose fitting clothing that is easily removed. 2. Leave jewelry and other valuables at home.  3. Remove all body piercing jewelry and leave at home. 4. Total time from sign-in until discharge is approximately 2-3 hours. 5. You should go home directly after your procedure and rest. You can resume normal activities the day after your procedure. 6. The day of your procedure you should not:  Drive  Make legal decisions  Operate machinery  Drink alcohol  Return to work   You may call the office (Dept: [phone]) before 5:00pm, or page the doctor on call [phone]) after 5:00pm, for further instructions, if necessary.   Insurance Information YOU WILL NEED TO CHECK WITH YOUR INSURANCE COMPANY FOR THE BENEFITS OF COVERAGE YOU HAVE FOR THIS PROCEDURE.  UNFORTUNATELY, NOT ALL INSURANCE COMPANIES HAVE BENEFITS TO COVER ALL OR PART OF THESE TYPES OF PROCEDURES.  IT IS YOUR RESPONSIBILITY TO CHECK YOUR BENEFITS, HOWEVER, WE WILL BE GLAD TO ASSIST YOU WITH ANY CODES YOUR INSURANCE COMPANY MAY NEED.    PLEASE NOTE THAT MOST INSURANCE COMPANIES WILL NOT COVER A SCREENING COLONOSCOPY FOR PEOPLE UNDER THE AGE OF 50  IF YOU HAVE BCBS INSURANCE, YOU MAY HAVE BENEFITS FOR A SCREENING COLONOSCOPY BUT IF POLYPS ARE FOUND THE DIAGNOSIS WILL CHANGE AND THEN YOU MAY HAVE A DEDUCTIBLE THAT WILL NEED TO BE MET. SO PLEASE MAKE SURE YOU CHECK YOUR BENEFITS FOR A SCREENING COLONOSCOPY AS WELL AS A DIAGNOSTIC COLONOSCOPY.

## 2018-05-10 NOTE — Progress Notes (Signed)
Gastroenterology Pre-Procedure Review  Request Date:05/10/18 Requesting Physician: 10 year recall last tcs-04/12/08- no polyps  PATIENT REVIEW QUESTIONS: The patient responded to the following health history questions as indicated:    1. Diabetes Melitis: yes (metfomin qam glipizide qam, insulin qhs) 2. Joint replacements in the past 12 months: no 3. Major health problems in the past 3 months: no 4. Has an artificial valve or MVP: no 5. Has a defibrillator: no 6. Has been advised in past to take antibiotics in advance of a procedure like teeth cleaning: no 7. Family history of colon cancer: no  8. Alcohol Use: no 9. History of sleep apnea: no  10. History of coronary artery or other vascular stents placed within the last 12 months: no 11. History of any prior anesthesia complications: no    MEDICATIONS & ALLERGIES:    Patient reports the following regarding taking any blood thinners:   Plavix? no Aspirin? yes (81mg ) Coumadin? no Brilinta? no Xarelto? no Eliquis? no Pradaxa? no Savaysa? no Effient? no  Patient confirms/reports the following medications:  Current Outpatient Medications  Medication Sig Dispense Refill  . aspirin EC 81 MG tablet Take 81 mg by mouth daily.    . hydrochlorothiazide (HYDRODIURIL) 25 MG tablet Take 25 mg by mouth every morning.  6  . Insulin Glargine (BASAGLAR KWIKPEN) 100 UNIT/ML SOPN 40 Units at bedtime.  5  . lisinopril (PRINIVIL,ZESTRIL) 20 MG tablet Take 1 tablet (20 mg total) by mouth daily. 30 tablet 3  . metFORMIN (GLUCOPHAGE) 500 MG tablet TAKE 1 Tablet BY MOUTH ONCE DAILY WITH MEALS  5  . omeprazole (PRILOSEC) 20 MG capsule Take 20 mg by mouth daily.  5   No current facility-administered medications for this visit.     Patient confirms/reports the following allergies:  No Known Allergies  No orders of the defined types were placed in this encounter.   AUTHORIZATION INFORMATION Pt does not have any insurance, applying for  assistance.   SCHEDULE INFORMATION: Procedure has been scheduled as follows:  Date: 07/09/18, Time: 11:15 Location: [facility]  This Gastroenterology Pre-Precedure Review Form is being routed to the following provider(s): Roseanne Kaufman NP

## 2018-05-17 NOTE — Progress Notes (Signed)
1/2 dose lantus evening prior. NO oral meds morning of.

## 2018-05-17 NOTE — Progress Notes (Signed)
Letter mailed to the pt with DM medication adjustments.  

## 2018-07-01 ENCOUNTER — Telehealth: Payer: Self-pay | Admitting: Gastroenterology

## 2018-07-01 NOTE — Telephone Encounter (Signed)
Lanny Hurst from finance called and said patient is approved for 100% [employer] discount.  Wanted to let us know so he could go ahead with his procedure

## 2018-07-01 NOTE — Telephone Encounter (Signed)
Routing to [employer].

## 2018-07-01 NOTE — Telephone Encounter (Signed)
Pt is scheduled for tcs on 07/09/18

## 2018-07-09 ENCOUNTER — Encounter (HOSPITAL_COMMUNITY): Payer: Self-pay | Admitting: *Deleted

## 2018-07-09 ENCOUNTER — Other Ambulatory Visit: Payer: Self-pay

## 2018-07-09 ENCOUNTER — Encounter (HOSPITAL_COMMUNITY)
Admission: RE | Disposition: A | Discharge status: Home / Self Care | Payer: Self-pay | Source: Home / Self Care | Attending: Gastroenterology

## 2018-07-09 ENCOUNTER — Ambulatory Visit (HOSPITAL_COMMUNITY)
Admission: RE | Admit: 2018-07-09 | Discharge: 2018-07-09 | Disposition: A | Discharge status: Home / Self Care | Payer: Self-pay | Attending: Gastroenterology | Admitting: Gastroenterology

## 2018-07-09 DIAGNOSIS — D123 Benign neoplasm of transverse colon: Secondary | ICD-10-CM

## 2018-07-09 DIAGNOSIS — K5730 Diverticulosis of large intestine without perforation or abscess without bleeding: Secondary | ICD-10-CM

## 2018-07-09 DIAGNOSIS — Z79899 Other long term (current) drug therapy: Secondary | ICD-10-CM | POA: Insufficient documentation

## 2018-07-09 DIAGNOSIS — K635 Polyp of colon: Secondary | ICD-10-CM | POA: Insufficient documentation

## 2018-07-09 DIAGNOSIS — K644 Residual hemorrhoidal skin tags: Secondary | ICD-10-CM | POA: Insufficient documentation

## 2018-07-09 DIAGNOSIS — I10 Essential (primary) hypertension: Secondary | ICD-10-CM | POA: Insufficient documentation

## 2018-07-09 DIAGNOSIS — K219 Gastro-esophageal reflux disease without esophagitis: Secondary | ICD-10-CM | POA: Insufficient documentation

## 2018-07-09 DIAGNOSIS — Z7982 Long term (current) use of aspirin: Secondary | ICD-10-CM | POA: Insufficient documentation

## 2018-07-09 DIAGNOSIS — Z794 Long term (current) use of insulin: Secondary | ICD-10-CM | POA: Insufficient documentation

## 2018-07-09 DIAGNOSIS — Z1211 Encounter for screening for malignant neoplasm of colon: Secondary | ICD-10-CM

## 2018-07-09 DIAGNOSIS — E119 Type 2 diabetes mellitus without complications: Secondary | ICD-10-CM | POA: Insufficient documentation

## 2018-07-09 DIAGNOSIS — K648 Other hemorrhoids: Secondary | ICD-10-CM | POA: Insufficient documentation

## 2018-07-09 HISTORY — PX: COLONOSCOPY: SHX5424

## 2018-07-09 HISTORY — PX: POLYPECTOMY: SHX5525

## 2018-07-09 LAB — GLUCOSE, CAPILLARY: Glucose-Capillary: 90 mg/dL (ref 70–99)

## 2018-07-09 SURGERY — COLONOSCOPY
Anesthesia: Moderate Sedation

## 2018-07-09 MED ORDER — MIDAZOLAM HCL 5 MG/5ML IJ SOLN
INTRAMUSCULAR | Status: AC
  Filled 2018-07-09: qty 10

## 2018-07-09 MED ORDER — MIDAZOLAM HCL 5 MG/5ML IJ SOLN
INTRAMUSCULAR | Status: DC | PRN
  Administered 2018-07-09: 2 mg via INTRAVENOUS
  Administered 2018-07-09: 1 mg via INTRAVENOUS

## 2018-07-09 MED ORDER — MEPERIDINE HCL 100 MG/ML IJ SOLN
INTRAMUSCULAR | Status: AC
  Filled 2018-07-09: qty 2

## 2018-07-09 MED ORDER — MEPERIDINE HCL 100 MG/ML IJ SOLN
INTRAMUSCULAR | Status: DC | PRN
  Administered 2018-07-09: 50 mg

## 2018-07-09 MED ORDER — STERILE WATER FOR IRRIGATION IR SOLN
Status: DC | PRN
  Administered 2018-07-09: 12:00:00

## 2018-07-09 MED ORDER — SODIUM CHLORIDE 0.9 % IV SOLN
INTRAVENOUS | Status: DC
  Administered 2018-07-09: 1000 mL via INTRAVENOUS

## 2018-07-09 NOTE — H&P (Signed)
Primary Care Physician:  Wyatt Haste, NP Primary Gastroenterologist:  Dr. Oneida Alar  Pre-Procedure History & Physical: HPI:  Henry Nguyen is a 58 y.o. male here for COLON CANCER SCREENING.  Past Medical History:  Diagnosis Date  . Diabetes mellitus without complication ([place])   . GERD (gastroesophageal reflux disease)   . Hypertension     Past Surgical History:  Procedure Laterality Date  . COLONOSCOPY  2010  . ESOPHAGOGASTRODUODENOSCOPY  2010    Prior to Admission medications   Medication Sig Start Date End Date Taking? Authorizing Provider  aspirin EC 81 MG tablet Take 81 mg by mouth daily.   Yes [provider]  BACK & BODY EXTRA STRENGTH 500-32.5 MG TABS Take 1 tablet by mouth 2 (two) times daily as needed (pain.).   Yes [provider]  glipiZIDE (GLUCOTROL) 10 MG tablet Take 10 mg by mouth daily before breakfast.   Yes [provider]  hydrochlorothiazide (HYDRODIURIL) 25 MG tablet Take 25 mg by mouth every morning. 04/14/18  Yes [provider]  Insulin Glargine (BASAGLAR KWIKPEN) 100 UNIT/ML SOPN Inject 40 Units into the skin at bedtime.  04/13/18  Yes [provider]  lisinopril (PRINIVIL,ZESTRIL) 20 MG tablet Take 1 tablet (20 mg total) by mouth daily. 12/03/17  Yes Lucia Gaskins, MD  metFORMIN (GLUCOPHAGE) 500 MG tablet Take 500 mg by mouth daily with breakfast.  04/13/18  Yes [provider]  naproxen sodium (ALEVE) 220 MG tablet Take 220 mg by mouth 2 (two) times daily as needed (pain.).   Yes [provider]  omeprazole (PRILOSEC) 20 MG capsule Take 20 mg by mouth daily before breakfast.  04/13/18  Yes [provider]    Allergies as of 05/10/2018  . (No Known Allergies)    Family History  Problem Relation Age of Onset  . Heart attack Mother   . CAD Mother   . Diabetes Sister   . Hypertension Sister   . Colon cancer Neg Hx   . Colon polyps Neg Hx     Social History    Socioeconomic History  . Marital status: Divorced    Spouse name: Not on file  . Number of children: Not on file  . Years of education: Not on file  . Highest education level: Not on file  Occupational History  . Not on file  Social Needs  . Financial resource strain: Not on file  . Food insecurity:    Worry: Not on file    Inability: Not on file  . Transportation needs:    Medical: Not on file    Non-medical: Not on file  Tobacco Use  . Smoking status: Never Smoker  . Smokeless tobacco: Never Used  Substance and Sexual Activity  . Alcohol use: Never    Alcohol/week: 0.0 standard drinks    Frequency: Never  . Drug use: Not Currently  . Sexual activity: Not on file  Lifestyle  . Physical activity:    Days per week: Not on file    Minutes per session: Not on file  . Stress: Not on file  Relationships  . Social connections:    Talks on phone: Not on file    Gets together: Not on file    Attends religious service: Not on file    Active member of club or organization: Not on file    Attends meetings of clubs or organizations: Not on file    Relationship status: Not on file  . Intimate partner violence:  Fear of current or ex partner: Not on file    Emotionally abused: Not on file    Physically abused: Not on file    Forced sexual activity: Not on file  Other Topics Concern  . Not on file  Social History Narrative   Working a Agricultural engineer. Single. No kids.    Review of Systems: See HPI, otherwise negative ROS   Physical Exam: BP (!) 141/89   Pulse 68   Temp 97.6 F (36.4 C) (Oral)   Resp 18   Ht 5' 8.5" (1.74 m)   Wt 104.3 kg   SpO2 100%   BMI 34.46 kg/m  General:   Alert,  pleasant and cooperative in NAD Head:  Normocephalic and atraumatic. Neck:  Supple; Lungs:  Clear throughout to auscultation.    Heart:  Regular rate and rhythm. Abdomen:  Soft, nontender and nondistended. Normal bowel sounds, without guarding, and without rebound.   Neurologic:   Alert and  oriented x4;  grossly normal neurologically.  Impression/Plan:    SCREENING  Plan:  1. TCS TODAY DISCUSSED PROCEDURE, BENEFITS, & RISKS: < 1% chance of medication reaction, bleeding, perforation, or rupture of spleen/liver.

## 2018-07-09 NOTE — Op Note (Signed)
[facility] Patient Name: Henry Nguyen Procedure Date: 07/09/2018 11:05 AM MRN: [id number] Date of Birth: 05-27-1961 Attending MD: Barney Drain MD, MD CSN: [id number] Age: 58 Admit Type: Outpatient Procedure:                Colonoscopy WITH COLD SNARE POLYPECTOMY Indications:              Screening for colorectal malignant neoplasm Providers:                Barney Drain MD, MD, Janeece Riggers, RN, Gerome Sam, RN, Randa Spike, Technician Referring MD:             Wyatt Haste Medicines:                Meperidine 50 mg IV, Midazolam 3 mg IV Complications:            No immediate complications. Estimated Blood Loss:     Estimated blood loss was minimal. Procedure:                Pre-Anesthesia Assessment:                           - Prior to the procedure, a History and Physical                            was performed, and patient medications and                            allergies were reviewed. The patient's tolerance of                            previous anesthesia was also reviewed. The risks                            and benefits of the procedure and the sedation                            options and risks were discussed with the patient.                            All questions were answered, and informed consent                            was obtained. Prior Anticoagulants: The patient has                            taken aspirin, last dose was day of procedure. ASA                            Grade Assessment: II - A patient with mild systemic                            disease. After reviewing the risks and benefits,  the patient was deemed in satisfactory condition to                            undergo the procedure. After obtaining informed                            consent, the colonoscope was passed under direct                            vision. Throughout the procedure, the patient's               blood pressure, pulse, and oxygen saturations were                            monitored continuously. The [id number] ([id number])                            scope was introduced through the anus and advanced                            to the the cecum, identified by appendiceal orifice                            and ileocecal valve. The colonoscopy was somewhat                            difficult due to a tortuous colon. Successful                            completion of the procedure was aided by                            straightening and shortening the scope to obtain                            bowel loop reduction and COLOWRAP. The patient                            tolerated the procedure well. The quality of the                            bowel preparation was good. The ileocecal valve,                            appendiceal orifice, and rectum were photographed. Scope In: 11:46:32 AM Scope Out: 12:01:43 PM Scope Withdrawal Time: 0 hours 12 minutes 59 seconds  Total Procedure Duration: 0 hours 15 minutes 11 seconds  Findings:      A 4 mm polyp was found in the hepatic flexure. The polyp was sessile.       The polyp was removed with a cold snare. Resection was complete, but the       polyp tissue was not retrieved.      Multiple small and large-mouthed diverticula were found in the entire  colon.      External and internal hemorrhoids were found.      The recto-sigmoid colon and sigmoid colon were mildly redundant. Impression:               - One 4 mm polyp at the hepatic flexure, removed                            with a cold snare. Complete resection. Polyp tissue                            not retrieved.                           - MODERATE Diverticulosis in the entire examined                            colon.                           - External and internal hemorrhoids.                           - Redundant colon. Moderate Sedation:      Moderate (conscious)  sedation was administered by the endoscopy nurse       and supervised by the endoscopist. The following parameters were       monitored: oxygen saturation, heart rate, blood pressure, and response       to care. Total physician intraservice time was 28 minutes. Recommendation:           - Patient has a contact number available for                            emergencies. The signs and symptoms of potential                            delayed complications were discussed with the                            patient. Return to normal activities tomorrow.                            Written discharge instructions were provided to the                            patient.                           - High fiber diet.                           - Continue present medications.                           - Repeat colonoscopy in 5-10 years for surveillance. Procedure Code(s):        --- Professional ---  [phone], Colonoscopy, flexible; with removal of                            tumor(s), polyp(s), or other lesion(s) by snare                            technique                           99153, Moderate sedation; each additional 15                            minutes intraservice time                           G0500, Moderate sedation services provided by the                            same physician or other qualified health care                            professional performing a gastrointestinal                            endoscopic service that sedation supports,                            requiring the presence of an independent trained                            observer to assist in the monitoring of the                            patient's level of consciousness and physiological                            status; initial 15 minutes of intra-service time;                            patient age 35 years or older (additional time may                            be reported with  [phone], as appropriate) Diagnosis Code(s):        --- Professional ---                           Z12.11, Encounter for screening for malignant                            neoplasm of colon                           D12.3, Benign neoplasm of transverse colon (hepatic  flexure or splenic flexure)                           K64.8, Other hemorrhoids                           K57.30, Diverticulosis of large intestine without                            perforation or abscess without bleeding                           Q43.8, Other specified congenital malformations of                            intestine CPT copyright 2018 American [employer] Association. All rights reserved. The codes documented in this report are preliminary and upon coder review may  be revised to meet current compliance requirements. Barney Drain, MD Barney Drain MD, MD 07/09/2018 12:18:33 PM This report has been signed electronically. Number of Addenda: 0

## 2018-07-09 NOTE — Discharge Instructions (Signed)
You have small internal hemorrhoids and diverticulosis IN YOUR LEFT AND RIGHT COLON. YOU HAD ONE SMALL POLYP REMOVED BUT IT WAS NOT RETRIEVED.    DRINK WATER TO KEEP YOUR URINE LIGHT YELLOW.  CONTINUE YOUR WEIGHT LOSS EFFORTS. YOUR BODY MASS INDEX IS OVER 30 WHICH MEANS YOU ARE OBESE. OBESITY IS ASSOCIATED WITH AN INCREASE FOR ALL CANCERS, INCLUDING ESOPHAGEAL AND COLON CANCER. A WEIGHT OF 195 LBS OR LESS  WILL GET YOUR BODY MASS INDEX(BMI) UNDER 30.  FOLLOW A HIGH FIBER DIET. AVOID ITEMS THAT CAUSE BLOATING. See info below.  USE PREPARATION H FOUR TIMES  A DAY IF NEEDED TO RELIEVE RECTAL PAIN/PRESSURE/BLEEDING.  Next colonoscopy BETWEEN 2025 AND 2027.  Colonoscopy Care After Read the instructions outlined below and refer to this sheet in the next week. These discharge instructions provide you with general information on caring for yourself after you leave the hospital. While your treatment has been planned according to the most current medical practices available, unavoidable complications occasionally occur. If you have any problems or questions after discharge, call DR. Alonnie Bieker, [phone].  ACTIVITY  You may resume your regular activity, but move at a slower pace for the next 24 hours.   Take frequent rest periods for the next 24 hours.   Walking will help get rid of the air and reduce the bloated feeling in your belly (abdomen).   No driving for 24 hours (because of the medicine (anesthesia) used during the test).   You may shower.   Do not sign any important legal documents or operate any machinery for 24 hours (because of the anesthesia used during the test).    NUTRITION  Drink plenty of fluids.   You may resume your normal diet as instructed by your doctor.   Begin with a light meal and progress to your normal diet. Heavy or fried foods are harder to digest and may make you feel sick to your stomach (nauseated).   Avoid alcoholic beverages for 24 hours or as  instructed.    MEDICATIONS  You may resume your normal medications.   WHAT YOU CAN EXPECT TODAY  Some feelings of bloating in the abdomen.   Passage of more gas than usual.   Spotting of blood in your stool or on the toilet paper  .  IF YOU HAD POLYPS REMOVED DURING THE COLONOSCOPY:  Eat a soft diet IF YOU HAVE NAUSEA, BLOATING, ABDOMINAL PAIN, OR VOMITING.    FINDING OUT THE RESULTS OF YOUR TEST Not all test results are available during your visit. DR. Oneida Alar WILL CALL YOU WITHIN 14 DAYS OF YOUR PROCEDUE WITH YOUR RESULTS. Do not assume everything is normal if you have not heard from DR. Oluwatamilore Starnes, CALL HER OFFICE AT [phone].  SEEK IMMEDIATE MEDICAL ATTENTION AND CALL THE OFFICE: [phone] IF:  You have more than a spotting of blood in your stool.   Your belly is swollen (abdominal distention).   You are nauseated or vomiting.   You have a temperature over 101F.   You have abdominal pain or discomfort that is severe or gets worse throughout the day.  High-Fiber Diet A high-fiber diet changes your normal diet to include more whole grains, legumes, fruits, and vegetables. Changes in the diet involve replacing refined carbohydrates with unrefined foods. The calorie level of the diet is essentially unchanged. The Dietary Reference Intake (recommended amount) for adult males is 38 grams per day. For adult females, it is 25 grams per day. Pregnant and lactating women should consume  28 grams of fiber per day. Fiber is the intact part of a plant that is not broken down during digestion. Functional fiber is fiber that has been isolated from the plant to provide a beneficial effect in the body. PURPOSE  Increase stool bulk.   Ease and regulate bowel movements.   Lower cholesterol.   REDUCE RISK OF COLON CANCER  INDICATIONS THAT YOU NEED MORE FIBER  Constipation and hemorrhoids.   Uncomplicated diverticulosis (intestine condition) and irritable bowel syndrome.    Weight management.   As a protective measure against hardening of the arteries (atherosclerosis), diabetes, and cancer.   GUIDELINES FOR INCREASING FIBER IN THE DIET  Start adding fiber to the diet slowly. A gradual increase of about 5 more grams (2 slices of whole-wheat bread, 2 servings of most fruits or vegetables, or 1 bowl of high-fiber cereal) per day is best. Too rapid an increase in fiber may result in constipation, flatulence, and bloating.   Drink enough water and fluids to keep your urine clear or pale yellow. Water, juice, or caffeine-free drinks are recommended. Not drinking enough fluid may cause constipation.   Eat a variety of high-fiber foods rather than one type of fiber.   Try to increase your intake of fiber through using high-fiber foods rather than fiber pills or supplements that contain small amounts of fiber.   The goal is to change the types of food eaten. Do not supplement your present diet with high-fiber foods, but replace foods in your present diet.   INCLUDE A VARIETY OF FIBER SOURCES  Replace refined and processed grains with whole grains, canned fruits with fresh fruits, and incorporate other fiber sources. White rice, white breads, and most bakery goods contain little or no fiber.   Brown whole-grain rice, buckwheat oats, and many fruits and vegetables are all good sources of fiber. These include: broccoli, Brussels sprouts, cabbage, cauliflower, beets, sweet potatoes, white potatoes (skin on), carrots, tomatoes, eggplant, squash, berries, fresh fruits, and dried fruits.   Cereals appear to be the richest source of fiber. Cereal fiber is found in whole grains and bran. Bran is the fiber-rich outer coat of cereal grain, which is largely removed in refining. In whole-grain cereals, the bran remains. In breakfast cereals, the largest amount of fiber is found in those with "bran" in their names. The fiber content is sometimes indicated on the label.   You may  need to include additional fruits and vegetables each day.   In baking, for 1 cup white flour, you may use the following substitutions:   1 cup whole-wheat flour minus 2 tablespoons.   1/2 cup white flour plus 1/2 cup whole-wheat flour.   Polyps, Colon  A polyp is extra tissue that grows inside your body. Colon polyps grow in the large intestine. The large intestine, also called the colon, is part of your digestive system. It is a long, hollow tube at the end of your digestive tract where your body makes and stores stool. Most polyps are not dangerous. They are benign. This means they are not cancerous. But over time, some types of polyps can turn into cancer. Polyps that are smaller than a pea are usually not harmful. But larger polyps could someday become or may already be cancerous. To be safe, doctors remove all polyps and test them.   PREVENTION There is not one sure way to prevent polyps. You might be able to lower your risk of getting them if you:  Eat more fruits and  vegetables and less fatty food.   Do not smoke.   Avoid alcohol.   Exercise every day.   Lose weight if you are overweight.   Eating more calcium and folate can also lower your risk of getting polyps. Some foods that are rich in calcium are milk, cheese, and broccoli. Some foods that are rich in folate are chickpeas, kidney beans, and spinach.    Diverticulosis Diverticulosis is a common condition that develops when small pouches (diverticula) form in the wall of the colon. The risk of diverticulosis increases with age. It happens more often in people who eat a low-fiber diet. Most individuals with diverticulosis have no symptoms. Those individuals with symptoms usually experience belly (abdominal) pain, constipation, or loose stools (diarrhea).  HOME CARE INSTRUCTIONS  Increase the amount of fiber in your diet as directed by your caregiver or dietician. This may reduce symptoms of diverticulosis.   Drink at  least 6 to 8 glasses of water each day to prevent constipation.   Try not to strain when you have a bowel movement.   Avoiding nuts and seeds to prevent complications is NOT NECESSARY.   FOODS HAVING HIGH FIBER CONTENT INCLUDE:  Fruits. Apple, peach, pear, tangerine, raisins, prunes.   Vegetables. Brussels sprouts, asparagus, broccoli, cabbage, carrot, cauliflower, romaine lettuce, spinach, summer squash, tomato, winter squash, zucchini.   Starchy Vegetables. Baked beans, kidney beans, lima beans, split peas, lentils, potatoes (with skin).   Grains. Whole wheat bread, brown rice, bran flake cereal, plain oatmeal, white rice, shredded wheat, bran muffins.   SEEK IMMEDIATE MEDICAL CARE IF:  You develop increasing pain or severe bloating.   You have an oral temperature above 101F.   You develop vomiting or bowel movements that are bloody or black.   Hemorrhoids Hemorrhoids are dilated (enlarged) veins around the rectum. Sometimes clots will form in the veins. This makes them swollen and painful. These are called thrombosed hemorrhoids. Causes of hemorrhoids include:  Constipation.   Straining to have a bowel movement.   HEAVY LIFTING   HOME CARE INSTRUCTIONS  Eat a well balanced diet and drink 6 to 8 glasses of water every day to avoid constipation. You may also use a bulk laxative.   Avoid straining to have bowel movements.   Keep anal area dry and clean.   Do not use a donut shaped pillow or sit on the toilet for long periods. This increases blood pooling and pain.   Move your bowels when your body has the urge; this will require less straining and will decrease pain and pressure.

## 2018-07-13 ENCOUNTER — Encounter (HOSPITAL_COMMUNITY): Payer: Self-pay | Admitting: Gastroenterology

## 2018-12-14 ENCOUNTER — Telehealth: Payer: Self-pay | Admitting: Family

## 2019-02-24 ENCOUNTER — Telehealth: Payer: Self-pay | Admitting: Family

## 2019-05-17 ENCOUNTER — Other Ambulatory Visit: Payer: Self-pay

## 2019-05-17 DIAGNOSIS — Z20822 Contact with and (suspected) exposure to covid-19: Secondary | ICD-10-CM

## 2019-05-19 LAB — NOVEL CORONAVIRUS, NAA: SARS-CoV-2, NAA: NOT DETECTED

## 2019-05-21 ENCOUNTER — Telehealth: Payer: Self-pay | Admitting: Family

## 2019-05-21 NOTE — Telephone Encounter (Signed)
Negative COVID results given. Patient results "NOT Detected." Caller expressed understanding. ° °

## 2020-09-11 NOTE — Congregational Nurse Program (Signed)
  Dept: [phone]   Congregational Nurse Program Note  Date of Encounter: 08/24/2020  Past Medical History: Past Medical History:  Diagnosis Date  . Diabetes mellitus without complication ([place])   . GERD (gastroesophageal reflux disease)   . Hypertension     Encounter Details:  CNP Questionnaire - 09/11/20 2109      Questionnaire   Do you give verbal consent to treat you today? Yes    Visit Setting Church or Counselling psychologist Patient Served At [employer], [employer]    Patient Status Not Applicable    Medical Provider Yes    Insurance Medicaid;Private Insurance    Intervention Assess (including screenings)          No complaints or concerns. Stated he takes his meds as prescribed and has been doing his exercises. BP 136/74, P-83. Erma Heritage RN, [place], [phone]

## 2020-10-16 ENCOUNTER — Ambulatory Visit (INDEPENDENT_AMBULATORY_CARE_PROVIDER_SITE_OTHER): Payer: Medicaid Other | Admitting: Orthopaedic Surgery

## 2020-10-16 ENCOUNTER — Ambulatory Visit: Payer: Medicaid Other

## 2020-10-16 ENCOUNTER — Other Ambulatory Visit: Payer: Self-pay

## 2020-10-16 ENCOUNTER — Encounter: Payer: Self-pay | Admitting: Orthopaedic Surgery

## 2020-10-16 VITALS — BP 127/81 | HR 89 | Ht 69.0 in | Wt 222.0 lb

## 2020-10-16 DIAGNOSIS — G8929 Other chronic pain: Secondary | ICD-10-CM

## 2020-10-16 DIAGNOSIS — M25561 Pain in right knee: Secondary | ICD-10-CM

## 2020-10-16 NOTE — Progress Notes (Signed)
Subjective:    Patient ID: Henry Nguyen, male    DOB: [birth year], 60 y.o.   MRN: [id number]  HPI He has had pain in the right knee for some time, worse over the last three months.  He has been followed at the [facility] in [place].  He has had injections there in February and March into the knee.  He had X-rays done in February but I do not have them to review.  He has swelling and popping and giving way of the right knee.  He has no redness, no trauma.  He is not getting better.  He has tried rest, ice, cane, heat, rubs, Advil with no help.  I have reviewed the notes from the [facility].   Review of Systems  Constitutional: Positive for activity change.  Musculoskeletal: Positive for arthralgias, gait problem and joint swelling.  All other systems reviewed and are negative.  For Review of Systems, all other systems reviewed and are negative.  The following is a summary of the past history medically, past history surgically, known current medicines, social history and family history.  This information is gathered electronically by the computer from prior information and documentation.  I review this each visit and have found including this information at this point in the chart is beneficial and informative.   Past Medical History:  Diagnosis Date  . Diabetes mellitus without complication ([place])   . GERD (gastroesophageal reflux disease)   . Hypertension     Past Surgical History:  Procedure Laterality Date  . COLONOSCOPY  2010  . COLONOSCOPY N/A 07/09/2018   Procedure: COLONOSCOPY;  Surgeon: Danie Binder, MD;  Location: [facility];  Service: Endoscopy;  Laterality: N/A;  11:15  . ESOPHAGOGASTRODUODENOSCOPY  2010  . POLYPECTOMY  07/09/2018   Procedure: POLYPECTOMY;  Surgeon: Danie Binder, MD;  Location: [facility];  Service: Endoscopy;;    Current Outpatient Medications on File Prior to Visit  Medication Sig Dispense Refill  . aspirin EC 81 MG tablet Take 81 mg  by mouth daily.    Marland Kitchen BACK & BODY EXTRA STRENGTH 500-32.5 MG TABS Take 1 tablet by mouth 2 (two) times daily as needed (pain.).    Marland Kitchen glipiZIDE (GLUCOTROL) 10 MG tablet Take 10 mg by mouth daily before breakfast.    . hydrochlorothiazide (HYDRODIURIL) 25 MG tablet Take 25 mg by mouth every morning.  6  . Insulin Glargine (BASAGLAR KWIKPEN) 100 UNIT/ML SOPN Inject 40 Units into the skin at bedtime.   5  . lisinopril (PRINIVIL,ZESTRIL) 20 MG tablet Take 1 tablet (20 mg total) by mouth daily. 30 tablet 3  . metFORMIN (GLUCOPHAGE) 500 MG tablet Take 500 mg by mouth daily with breakfast.   5  . naproxen sodium (ALEVE) 220 MG tablet Take 220 mg by mouth 2 (two) times daily as needed (pain.).    Marland Kitchen omeprazole (PRILOSEC) 20 MG capsule Take 20 mg by mouth daily before breakfast.   5  . amLODipine (NORVASC) 10 MG tablet Take 1 tablet by mouth daily. (Patient not taking: Reported on 10/16/2020)    . cloNIDine (CATAPRES) 0.1 MG tablet Take 0.1 mg by mouth 2 (two) times daily. (Patient not taking: Reported on 10/16/2020)     No current facility-administered medications on file prior to visit.    Social History   Socioeconomic History  . Marital status: Divorced    Spouse name: Not on file  . Number of children: Not on file  . Years  of education: Not on file  . Highest education level: Not on file  Occupational History  . Not on file  Tobacco Use  . Smoking status: Never Smoker  . Smokeless tobacco: Never Used  Vaping Use  . Vaping Use: Never used  Substance and Sexual Activity  . Alcohol use: Never    Alcohol/week: 0.0 standard drinks  . Drug use: Not Currently  . Sexual activity: Not on file  Other Topics Concern  . Not on file  Social History Narrative   Working a Agricultural engineer. Single. No kids.   Social Determinants of Health   Financial Resource Strain: Not on file  Food Insecurity: Not on file  Transportation Needs: Not on file  Physical Activity: Not on file  Stress: Not on file   Social Connections: Not on file  Intimate Partner Violence: Not on file    Family History  Problem Relation Age of Onset  . Heart attack Mother   . CAD Mother   . Diabetes Sister   . Hypertension Sister   . Colon cancer Neg Hx   . Colon polyps Neg Hx     BP 127/81   Pulse 89   Ht 5\' 9"  (1.753 m)   Wt 222 lb (100.7 kg)   BMI 32.78 kg/m   Body mass index is 32.78 kg/m.      Objective:   Physical Exam Vitals and nursing note reviewed. Exam conducted with a chaperone present.  Constitutional:      Appearance: He is well-developed.  HENT:     Head: Normocephalic and atraumatic.  Eyes:     Conjunctiva/sclera: Conjunctivae normal.     Pupils: Pupils are equal, round, and reactive to light.  Cardiovascular:     Rate and Rhythm: Normal rate and regular rhythm.  Pulmonary:     Effort: Pulmonary effort is normal.  Abdominal:     Palpations: Abdomen is soft.  Musculoskeletal:     Cervical back: Normal range of motion and neck supple.       Legs:  Skin:    General: Skin is warm and dry.  Neurological:     Mental Status: He is alert and oriented to person, place, and time.     Cranial Nerves: No cranial nerve deficit.     Motor: No abnormal muscle tone.     Coordination: Coordination normal.     Deep Tendon Reflexes: Reflexes are normal and symmetric. Reflexes normal.  Psychiatric:        Behavior: Behavior normal.        Thought Content: Thought content normal.        Judgment: Judgment normal.    X-rays were done of the right knee, reported separately.      Assessment & Plan:   Encounter Diagnosis  Name Primary?  . Chronic pain of right knee Yes   I am concerned about tear of medial meniscus of the right knee.  I have recommended MRI of the right knee.  Return in three weeks.  Call if any problem.  Precautions discussed.   Electronically Signed Sanjuana Kava, MD 4/26/20222:03 PM

## 2020-10-25 ENCOUNTER — Ambulatory Visit (HOSPITAL_COMMUNITY): Payer: Medicaid Other

## 2020-10-31 ENCOUNTER — Other Ambulatory Visit: Payer: Self-pay

## 2020-10-31 ENCOUNTER — Ambulatory Visit (HOSPITAL_COMMUNITY)
Admission: RE | Admit: 2020-10-31 | Discharge: 2020-10-31 | Disposition: A | Discharge status: Home / Self Care | Payer: Medicaid Other | Source: Ambulatory Visit | Attending: Orthopaedic Surgery | Admitting: Orthopaedic Surgery

## 2020-10-31 DIAGNOSIS — G8929 Other chronic pain: Secondary | ICD-10-CM | POA: Diagnosis present

## 2020-10-31 DIAGNOSIS — M25561 Pain in right knee: Secondary | ICD-10-CM | POA: Diagnosis present

## 2020-10-31 IMAGING — MR MR KNEE*R* W/O CM
6 series · 40 of 40 positions shown · non-contrast
Comparison: Right knee x-rays dated [DATE].

CLINICAL DATA: Chronic right knee pain and swelling for the past
year. No injury or prior surgery.

EXAM:
MRI OF THE RIGHT KNEE WITHOUT CONTRAST
TECHNIQUE: Multiplanar, multisequence MR imaging of the knee was performed. No
intravenous contrast was administered.

[Series 8: T2 fat-sat · axial · right · 4.0mm · 0.47mm/px · z∈[-68,+57]mm · 5 of 26 slices shown (1 of 3)]
[im 1/26]
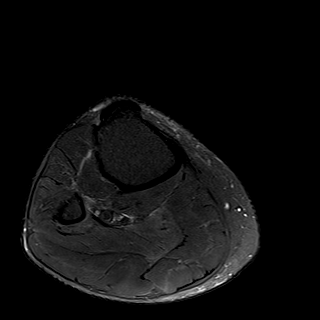
[im 7/26]
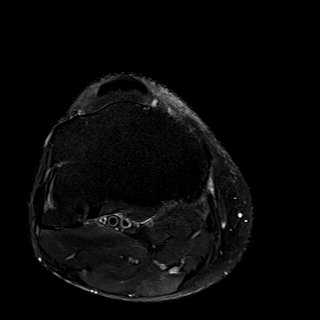
[im 13/26]
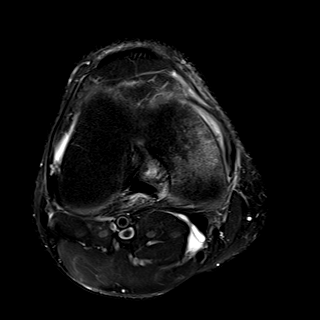
[im 19/26]
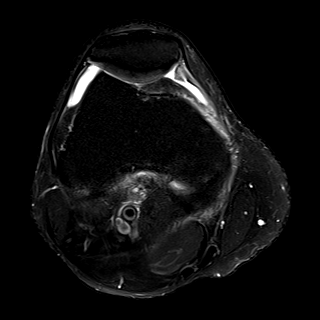
[im 26/26]
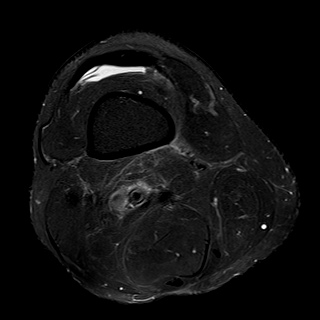

[Series 9: T1 · coronal · right · 4.0mm · 0.59mm/px · 6 of 27 slices shown]
[im 1/27]
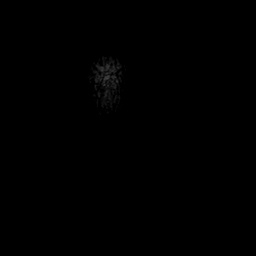
[im 6/27]
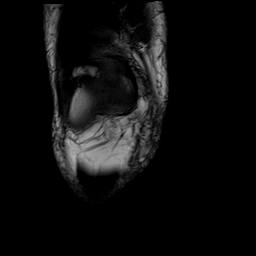
[im 11/27]
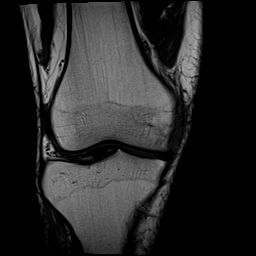
[im 16/27]
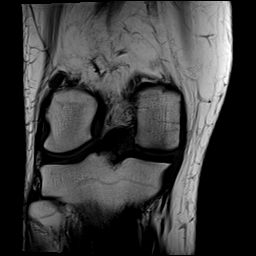
[im 21/27]
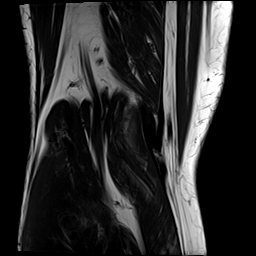
[im 27/27]
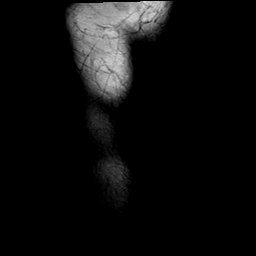

[Series 10: T2 fat-sat · coronal · right · 4.0mm · 0.59mm/px · 7 of 28 slices shown (2 of 3)]
[im 1/28]
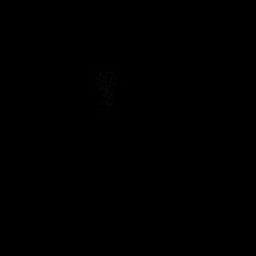
[im 5/28]
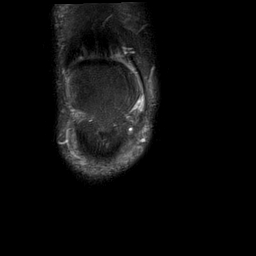
[im 10/28]
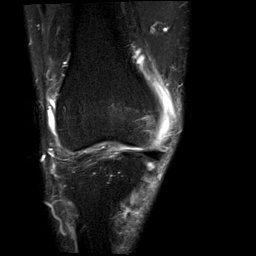
[im 14/28]
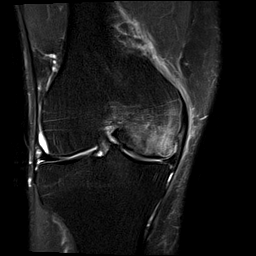
[im 19/28]
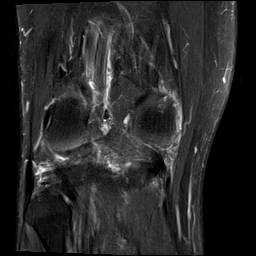
[im 23/28]
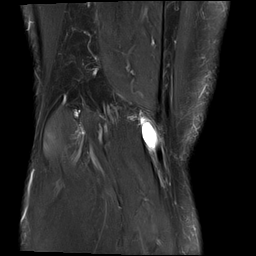
[im 28/28]
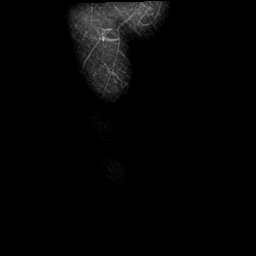

[Series 12: PD fat-sat · sagittal · right · 3.0mm · 0.59mm/px · 7 of 28 slices shown (1 of 2)]
[im 1/28]
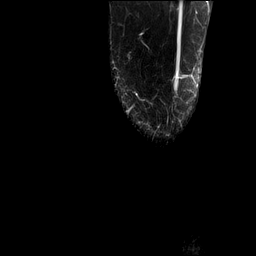
[im 5/28]
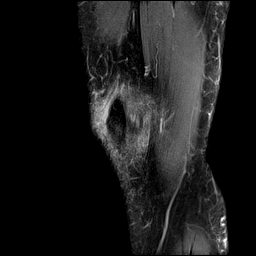
[im 10/28]
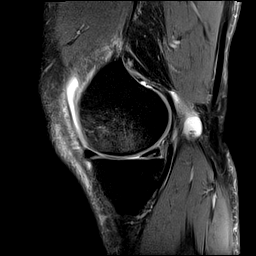
[im 14/28]
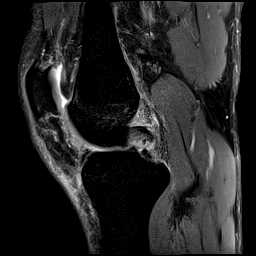
[im 19/28]
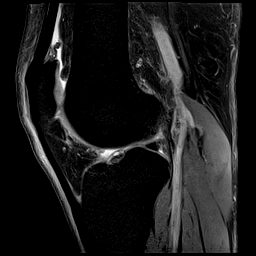
[im 23/28]
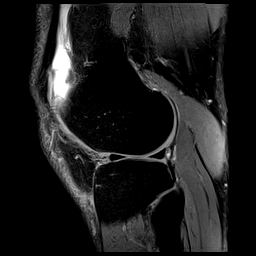
[im 28/28]
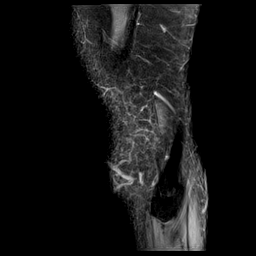

[Series 13: T2 fat-sat · sagittal · right · 3.0mm · 0.59mm/px · 7 of 28 slices shown (3 of 3)]
[im 1/28]
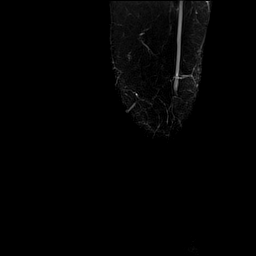
[im 5/28]
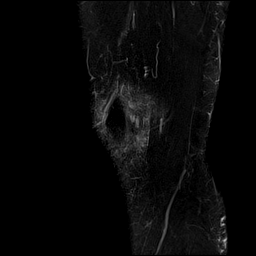
[im 10/28]
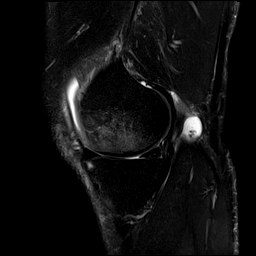
[im 14/28]
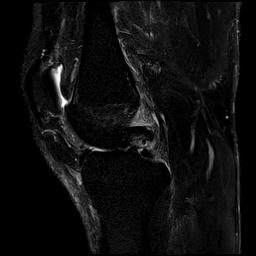
[im 19/28]
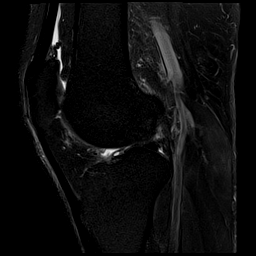
[im 23/28]
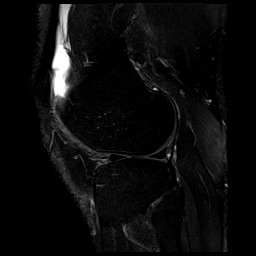
[im 28/28]
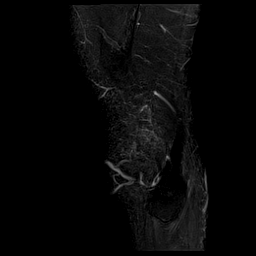

[Series 14: PD fat-sat · coronal · right · 3.0mm · 0.59mm/px · 8 of 35 slices shown (2 of 2)]
[im 1/35]
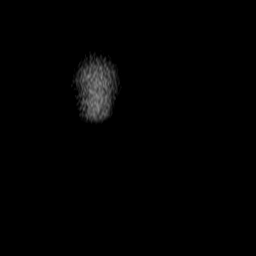
[im 5/35]
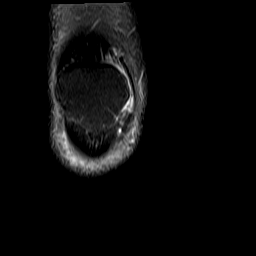
[im 10/35]
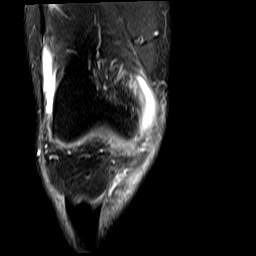
[im 15/35]
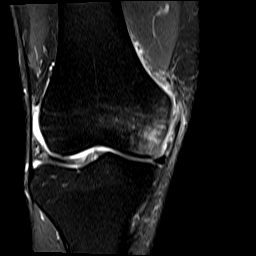
[im 20/35]
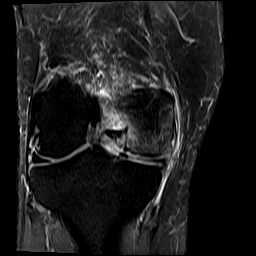
[im 25/35]
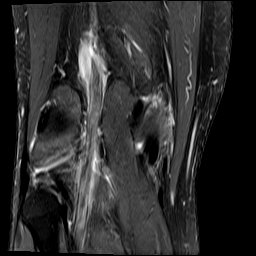
[im 30/35]
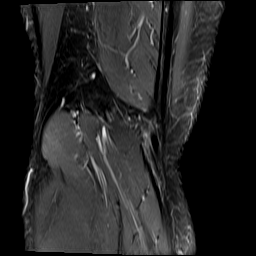
[im 35/35]
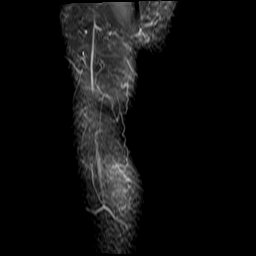

[40 of 40 positions shown; findings below may reference images not displayed]

FINDINGS: MENISCI

Medial meniscus: Horizontal tear of the posterior body and horn
(series 12, images 18-21).

Lateral meniscus:  Intact.

LIGAMENTS

Cruciates:  Intact ACL and PCL.

Collaterals: Medial collateral ligament is intact. Lateral
collateral ligament complex is intact.

CARTILAGE

Patellofemoral:  No chondral defect.

Medial: Diffuse cartilage thinning with more focal full-thickness
cartilage loss over the posterior weight-bearing medial femoral
condyle.

Lateral:  No chondral defect.

Joint:  Small joint effusion.  Minimal edema within Hoffa's fat.

Popliteal Fossa:  Small Baker cyst.  Intact popliteus tendon.

Extensor Mechanism: Intact quadriceps tendon and patellar tendon.
Intact medial and lateral patellar retinaculum. Intact MPFL.

Bones: Subchondral insufficiency fracture of the medial femoral
condyle with surrounding marrow edema. No dislocation. No suspicious
bone lesion.

Other: None.
IMPRESSION: 1. Subchondral insufficiency fracture of the medial femoral condyle.
2. Horizontal tear of the medial meniscus posterior body and horn.
3. Mild medial compartment osteoarthritis.
4. Small joint effusion and Baker cyst.

## 2020-11-06 ENCOUNTER — Ambulatory Visit (INDEPENDENT_AMBULATORY_CARE_PROVIDER_SITE_OTHER): Payer: Medicaid Other | Admitting: Orthopaedic Surgery

## 2020-11-06 ENCOUNTER — Other Ambulatory Visit: Payer: Self-pay

## 2020-11-06 ENCOUNTER — Encounter: Payer: Self-pay | Admitting: Orthopaedic Surgery

## 2020-11-06 VITALS — BP 138/76 | HR 61 | Ht 69.0 in | Wt 228.6 lb

## 2020-11-06 DIAGNOSIS — S83241A Other tear of medial meniscus, current injury, right knee, initial encounter: Secondary | ICD-10-CM | POA: Diagnosis not present

## 2020-11-06 NOTE — Progress Notes (Signed)
Patient HW:EXHBZJI Levels, male DOB:08-19-1960, 60 y.o. RCV:[id number]  No chief complaint on file.   HPI  Henry Nguyen is a 60 y.o. male who has right knee pain.  He had MRI of the knee which showed:  IMPRESSION: 1. Subchondral insufficiency fracture of the medial femoral condyle. 2. Horizontal tear of the medial meniscus posterior body and horn. 3. Mild medial compartment osteoarthritis. 4. Small joint effusion and Baker cyst.  I have explained the findings to him.  I will have him see Dr. Aline Brochure.  I have independently reviewed the MRI.     His right knee is tender, more medially still.  He is using a cane.   Body mass index is 33.76 kg/m.  ROS  Review of Systems  Constitutional: Positive for activity change.  Musculoskeletal: Positive for arthralgias, gait problem and joint swelling.  All other systems reviewed and are negative.   All other systems reviewed and are negative.  The following is a summary of the past history medically, past history surgically, known current medicines, social history and family history.  This information is gathered electronically by the computer from prior information and documentation.  I review this each visit and have found including this information at this point in the chart is beneficial and informative.    Past Medical History:  Diagnosis Date  . Diabetes mellitus without complication ([place])   . GERD (gastroesophageal reflux disease)   . Hypertension     Past Surgical History:  Procedure Laterality Date  . COLONOSCOPY  2010  . COLONOSCOPY N/A 07/09/2018   Procedure: COLONOSCOPY;  Surgeon: Danie Binder, MD;  Location: [facility];  Service: Endoscopy;  Laterality: N/A;  11:15  . ESOPHAGOGASTRODUODENOSCOPY  2010  . POLYPECTOMY  07/09/2018   Procedure: POLYPECTOMY;  Surgeon: Danie Binder, MD;  Location: [facility];  Service: Endoscopy;;    Family History  Problem Relation Age of Onset  . Heart attack Mother    . CAD Mother   . Diabetes Sister   . Hypertension Sister   . Colon cancer Neg Hx   . Colon polyps Neg Hx     Social History Social History   Tobacco Use  . Smoking status: Never Smoker  . Smokeless tobacco: Never Used  Vaping Use  . Vaping Use: Never used  Substance Use Topics  . Alcohol use: Never    Alcohol/week: 0.0 standard drinks  . Drug use: Not Currently    No Known Allergies  Current Outpatient Medications  Medication Sig Dispense Refill  . amLODipine (NORVASC) 10 MG tablet Take 1 tablet by mouth daily.    Marland Kitchen aspirin EC 81 MG tablet Take 81 mg by mouth daily.    Marland Kitchen BACK & BODY EXTRA STRENGTH 500-32.5 MG TABS Take 1 tablet by mouth 2 (two) times daily as needed (pain.).    Marland Kitchen cloNIDine (CATAPRES) 0.1 MG tablet Take 0.1 mg by mouth 2 (two) times daily.    Marland Kitchen glipiZIDE (GLUCOTROL) 10 MG tablet Take 10 mg by mouth daily before breakfast.    . hydrochlorothiazide (HYDRODIURIL) 25 MG tablet Take 25 mg by mouth every morning.  6  . Insulin Glargine (BASAGLAR KWIKPEN) 100 UNIT/ML SOPN Inject 40 Units into the skin at bedtime.   5  . lisinopril (PRINIVIL,ZESTRIL) 20 MG tablet Take 1 tablet (20 mg total) by mouth daily. 30 tablet 3  . metFORMIN (GLUCOPHAGE) 500 MG tablet Take 500 mg by mouth daily with breakfast.   5  . naproxen sodium (  ALEVE) 220 MG tablet Take 220 mg by mouth 2 (two) times daily as needed (pain.).    Marland Kitchen omeprazole (PRILOSEC) 20 MG capsule Take 20 mg by mouth daily before breakfast.   5   No current facility-administered medications for this visit.     Physical Exam  Blood pressure 138/76, pulse 61, height 5\' 9"  (1.753 m), weight 228 lb 9.6 oz (103.7 kg).  Constitutional: overall normal hygiene, normal nutrition, well developed, normal grooming, normal body habitus. Assistive device:cane  Musculoskeletal: gait and station Limp right, muscle tone and strength are normal, no tremors or atrophy is present.  .  Neurological: coordination overall normal.   Deep tendon reflex/nerve stretch intact.  Sensation normal.  Cranial nerves II-XII intact.   Skin:   Normal overall no scars, lesions, ulcers or rashes. No psoriasis.  Psychiatric: Alert and oriented x 3.  Recent memory intact, remote memory unclear.  Normal mood and affect. Well groomed.  Good eye contact.  Cardiovascular: overall no swelling, no varicosities, no edema bilaterally, normal temperatures of the legs and arms, no clubbing, cyanosis and good capillary refill.  Lymphatic: palpation is normal.  Right knee has pain medially, crepitus, some effusion, ROM 0 to 105, limp right, NV intact, positive medial [place].    All other systems reviewed and are negative   The patient has been educated about the nature of the problem(s) and counseled on treatment options.  The patient appeared to understand what I have discussed and is in agreement with it.  Encounter Diagnosis  Name Primary?  . Other tear of medial meniscus of right knee as current injury, initial encounter Yes    PLAN Call if any problems.  Precautions discussed.  Continue current medications.   Return to clinic to see Dr. Aline Brochure.   Electronically Signed Sanjuana Kava, MD 5/17/202210:00 AM

## 2020-11-14 ENCOUNTER — Encounter: Payer: Self-pay | Admitting: Orthopedic Surgery

## 2020-11-14 ENCOUNTER — Ambulatory Visit (INDEPENDENT_AMBULATORY_CARE_PROVIDER_SITE_OTHER): Payer: Medicaid Other | Admitting: Orthopedic Surgery

## 2020-11-14 ENCOUNTER — Other Ambulatory Visit: Payer: Self-pay

## 2020-11-14 VITALS — BP 151/82 | HR 78 | Ht 69.0 in | Wt 229.0 lb

## 2020-11-14 DIAGNOSIS — M8440XA Pathological fracture, unspecified site, initial encounter for fracture: Secondary | ICD-10-CM | POA: Diagnosis not present

## 2020-11-14 DIAGNOSIS — M23322 Other meniscus derangements, posterior horn of medial meniscus, left knee: Secondary | ICD-10-CM

## 2020-11-14 NOTE — Progress Notes (Signed)
Chief Complaint  Patient presents with  . Knee Pain    Right knee, wants to discuss surgery,    59 year old male seen by Dr. Luna Glasgow found to have a medial meniscal tear to discuss arthroscopic surgery  This 60 year old male who is a diabetic he has hypertension some reflux on Prilosec Zestril Glucophage Glucotrol HydroDIURIL Norvasc presents with atraumatic onset of pain medial aspect right knee 9 months ago complains of aching burning and throbbing pain on the medial side.  His prior treatment included Advil rest ice a cane heat various creams  Past Medical History:  Diagnosis Date  . Diabetes mellitus without complication ([place])   . GERD (gastroesophageal reflux disease)   . Hypertension    Past Surgical History:  Procedure Laterality Date  . COLONOSCOPY  2010  . COLONOSCOPY N/A 07/09/2018   Procedure: COLONOSCOPY;  Surgeon: Danie Binder, MD;  Location: [facility];  Service: Endoscopy;  Laterality: N/A;  11:15  . ESOPHAGOGASTRODUODENOSCOPY  2010  . POLYPECTOMY  07/09/2018   Procedure: POLYPECTOMY;  Surgeon: Danie Binder, MD;  Location: [facility];  Service: Endoscopy;;  \ No Known Allergies ,famh Review of Systems  Respiratory: Negative for shortness of breath.   Cardiovascular: Negative for chest pain.   BP (!) 151/82   Pulse 78   Ht 5\' 9"  (1.753 m)   Wt 229 lb (103.9 kg)   BMI 33.82 kg/m   Normal development grooming and hygiene awake alert and oriented x3 mood and affect normal  Right knee no effusion tenderness over the medial femoral condyle and the medial joint line the knee comes to just about full extension and his flexion is about 100 degrees and he has pain  Knee feels stable to ligament testing  Skin is intact  No edema good pulses and perfusion   The MRI shows a large medial femoral condyle subchondral insufficiency fracture and a medial meniscus tear  I placed him in a playmaker brace Recommend cane vitamin D come back in 8 weeks discuss  surgery at that time

## 2020-11-14 NOTE — Progress Notes (Signed)
8/9 pain today with some swelling per patient.

## 2020-11-14 NOTE — Patient Instructions (Signed)
Vitamin D 4000 u per day fore 8 weeks   Wear brace for all weight bearing activity   Cane   Limit walking

## 2021-01-07 ENCOUNTER — Ambulatory Visit (INDEPENDENT_AMBULATORY_CARE_PROVIDER_SITE_OTHER): Payer: Medicaid Other | Admitting: Orthopedic Surgery

## 2021-01-07 ENCOUNTER — Other Ambulatory Visit: Payer: Self-pay

## 2021-01-07 ENCOUNTER — Encounter: Payer: Self-pay | Admitting: Orthopedic Surgery

## 2021-01-07 VITALS — BP 116/68 | HR 72 | Ht 69.0 in | Wt 214.0 lb

## 2021-01-07 DIAGNOSIS — M1711 Unilateral primary osteoarthritis, right knee: Secondary | ICD-10-CM

## 2021-01-07 DIAGNOSIS — M23321 Other meniscus derangements, posterior horn of medial meniscus, right knee: Secondary | ICD-10-CM | POA: Diagnosis not present

## 2021-01-07 DIAGNOSIS — M1710 Unilateral primary osteoarthritis, unspecified knee: Secondary | ICD-10-CM

## 2021-01-07 DIAGNOSIS — M8440XA Pathological fracture, unspecified site, initial encounter for fracture: Secondary | ICD-10-CM | POA: Diagnosis not present

## 2021-01-07 DIAGNOSIS — G8929 Other chronic pain: Secondary | ICD-10-CM

## 2021-01-07 DIAGNOSIS — M23322 Other meniscus derangements, posterior horn of medial meniscus, left knee: Secondary | ICD-10-CM

## 2021-01-07 NOTE — Addendum Note (Signed)
Addended by: Arther Abbott E on: 01/07/2021 03:09 PM   Modules accepted: Orders, SmartSet

## 2021-01-07 NOTE — Addendum Note (Signed)
Addended by: Carole Civil on: 01/07/2021 04:10 PM   Modules accepted: Orders

## 2021-01-07 NOTE — Patient Instructions (Signed)
Meniscus Injury, Arthroscopy   Arthroscopy is a surgical procedure that involves the use of a small scope that has a camera and surgical instruments on the end (arthroscope). An arthroscope can be used to repair your meniscus injury.  LET YOUR HEALTH CARE PROVIDER KNOW ABOUT:  Any allergies you have.  All medicines you are taking, including vitamins, herbs, eyedrops, creams, and over-the-counter medicines.  Any recent colds or infections you have had or currently have.  Previous problems you or members of your family have had with the use of anesthetics.  Any blood disorders or blood clotting problems you have.  Previous surgeries you have had.  Medical conditions you have. RISKS AND COMPLICATIONS Generally, this is a safe procedure. However, as with any procedure, problems can occur. Possible problems include:  Damage to nerves or blood vessels.  Excess bleeding.  Blood clots.  Infection. BEFORE THE PROCEDURE  Do not eat or drink for 6-8 hours before the procedure.  Take medicines as directed by your surgeon. Ask your surgeon about changing or stopping your regular medicines.  You may have lab tests the morning of surgery. PROCEDURE  You will be given one of the following:   A medicine that numbs the area (local anesthesia).  A medicine that makes you go to sleep (general anesthesia).  A medicine injected into your spine that numbs your body below the waist (spinal anesthesia). Most often, several small cuts (incisions) are made in the knee. The arthroscope and instruments go into the incisions to repair the damage. The torn portion of the meniscus is removed.   AFTER THE PROCEDURE  You will be taken to the recovery area where your progress will be monitored. When you are awake, stable, and taking fluids without complications, you will be allowed to go home. This is usually the same day. A torn or stretched ligament (ligament sprain) may take 6-8 weeks to heal.   It  takes about the 4-6 WEEKS if your surgeon removed a torn meniscus.  A repaired meniscus may require 6-12 weeks of recovery time.  A torn ligament needing reconstructive surgery may take 6-12 months to heal fully.   This information is not intended to replace advice given to you by your health care provider. Make sure you discuss any questions you have with your health care provider. You have decided to proceed with operative arthroscopy of the knee. You have decided not to continue with nonoperative measures such as but not limited to oral medication, weight loss, activity modification, physical therapy, bracing, or injection.  We will perform operative arthroscopy of the knee. Some of the risks associated with arthroscopic surgery of the knee include but are not limited to Bleeding Infection Swelling Stiffness Blood clot Pain Need for knee replacement surgery    In compliance with recent [place] law in federal regulation regarding opioid use and abuse and addiction, we will taper (stop) opioid medication after 2 weeks.  If you're not comfortable with these risks and would like to continue with nonoperative treatment please let Dr. Quenten Nawaz know prior to your surgery. 

## 2021-01-07 NOTE — Progress Notes (Signed)
Chief Complaint  Patient presents with   Knee Pain    R/here to discuss surgery.  Still having pain, but at the moment its ok.   Henry Nguyen is 60 years old he is a diabetic who has hypertension he has been in a playmaker hinged knee brace for the last 8 weeks for the subchondral bone insufficiency fracture in his medial femoral condyle he says that is somewhat better although he still having medial joint line pain  His exam shows tenderness over the medial femoral condyle and medial joint line consistent with his MRI  I think is okay to have surgery now for arthroscopy of the right knee partial medial meniscectomy  I made him aware that he will need to wear his brace another 6 weeks after surgery to heal that insufficiency fracture and this will require protected weightbearing  He has agreed to have surgery on August 2 arthroscopy right knee and partial medial meniscectomy  The procedure has been fully reviewed with the patient; The risks and benefits of surgery have been discussed and explained and understood. Alternative treatment has also been reviewed, questions were encouraged and answered. The postoperative plan is also been reviewed.

## 2021-01-08 ENCOUNTER — Other Ambulatory Visit: Payer: Self-pay | Admitting: Orthopedic Surgery

## 2021-01-08 DIAGNOSIS — S83241A Other tear of medial meniscus, current injury, right knee, initial encounter: Secondary | ICD-10-CM

## 2021-01-10 ENCOUNTER — Encounter (HOSPITAL_COMMUNITY): Payer: Self-pay | Admitting: Anesthesiology

## 2021-01-17 NOTE — Patient Instructions (Signed)
Your procedure is scheduled on: 01/22/2021  Report to [facility] at    10:10 AM.  Call this number if you have problems the morning of surgery: [phone]   Remember:   Do not Eat or Drink after midnight         No Smoking the morning of surgery  :  Take these medicines the morning of surgery with A SIP OF WATER: Amlodipine, Clonidine, and omeprazole  No diabetic medication morning of surgery   Do not wear jewelry, make-up or nail polish.  Do not wear lotions, powders, or perfumes. You may wear deodorant.  Do not shave 48 hours prior to surgery. Men may shave face and neck.  Do not bring valuables to the hospital.  Contacts, dentures or bridgework may not be worn into surgery.  Leave suitcase in the car. After surgery it may be brought to your room.  For patients admitted to the hospital, checkout time is 11:00 AM the day of discharge.   Patients discharged the day of surgery will not be allowed to drive home.    Special Instructions: Shower using CHG night before surgery and shower the day of surgery use CHG.  Use special wash - you have one bottle of CHG for all showers.  You should use approximately 1/2 of the bottle for each shower.  How to Use Chlorhexidine for Bathing Chlorhexidine gluconate (CHG) is a germ-killing (antiseptic) solution that is used to clean the skin. It can get rid of the bacteria that normally live on the skin and can keep them away for about 24 hours. To clean your skin with CHG, you may be given: A CHG solution to use in the shower or as part of a sponge bath. A prepackaged cloth that contains CHG. Cleaning your skin with CHG may help lower the risk for infection: While you are staying in the intensive care unit of the hospital. If you have a vascular access, such as a central line, to provide short-term or long-term access to your veins. If you have a catheter to drain urine from your bladder. If you are on a ventilator. A ventilator is a  machine that helps you breathe by moving air in and out of your lungs. After surgery. What are the risks? Risks of using CHG include: A skin reaction. Hearing loss, if CHG gets in your ears. Eye injury, if CHG gets in your eyes and is not rinsed out. The CHG product catching fire. Make sure that you avoid smoking and flames after applying CHG to your skin. Do not use CHG: If you have a chlorhexidine allergy or have previously reacted to chlorhexidine. On babies younger than 82 months of age. How to use CHG solution Use CHG only as told by your health care provider, and follow the instructions on the label. Use the full amount of CHG as directed. Usually, this is one bottle. During a shower Follow these steps when using CHG solution during a shower (unless your health care provider gives you different instructions): Start the shower. Use your normal soap and shampoo to wash your face and hair. Turn off the shower or move out of the shower stream. Pour the CHG onto a clean washcloth. Do not use any type of brush or rough-edged sponge. Starting at your neck, lather your body down to your toes. Make sure you follow these instructions: If you will be having surgery, pay special attention to the part of your body where you will  be having surgery. Scrub this area for at least 1 minute. Do not use CHG on your head or face. If the solution gets into your ears or eyes, rinse them well with water. Avoid your genital area. Avoid any areas of skin that have broken skin, cuts, or scrapes. Scrub your back and under your arms. Make sure to wash skin folds. Let the lather sit on your skin for 1-2 minutes or as long as told by your health care provider. Thoroughly rinse your entire body in the shower. Make sure that all body creases and crevices are rinsed well. Dry off with a clean towel. Do not put any substances on your body afterward--such as powder, lotion, or perfume--unless you are told to do so by  your health care provider. Only use lotions that are recommended by the manufacturer. Put on clean clothes or pajamas. If it is the night before your surgery, sleep in clean sheets.  During a sponge bath Follow these steps when using CHG solution during a sponge bath (unless your health care provider gives you different instructions): Use your normal soap and shampoo to wash your face and hair. Pour the CHG onto a clean washcloth. Starting at your neck, lather your body down to your toes. Make sure you follow these instructions: If you will be having surgery, pay special attention to the part of your body where you will be having surgery. Scrub this area for at least 1 minute. Do not use CHG on your head or face. If the solution gets into your ears or eyes, rinse them well with water. Avoid your genital area. Avoid any areas of skin that have broken skin, cuts, or scrapes. Scrub your back and under your arms. Make sure to wash skin folds. Let the lather sit on your skin for 1-2 minutes or as long as told by your health care provider. Using a different clean, wet washcloth, thoroughly rinse your entire body. Make sure that all body creases and crevices are rinsed well. Dry off with a clean towel. Do not put any substances on your body afterward--such as powder, lotion, or perfume--unless you are told to do so by your health care provider. Only use lotions that are recommended by the manufacturer. Put on clean clothes or pajamas. If it is the night before your surgery, sleep in clean sheets. How to use CHG prepackaged cloths Only use CHG cloths as told by your health care provider, and follow the instructions on the label. Use the CHG cloth on clean, dry skin. Do not use the CHG cloth on your head or face unless your health care provider tells you to. When washing with the CHG cloth: Avoid your genital area. Avoid any areas of skin that have broken skin, cuts, or scrapes. Before  surgery Follow these steps when using a CHG cloth to clean before surgery (unless your health care provider gives you different instructions): Using the CHG cloth, vigorously scrub the part of your body where you will be having surgery. Scrub using a back-and-forth motion for 3 minutes. The area on your body should be completely wet with CHG when you are done scrubbing. Do not rinse. Discard the cloth and let the area air-dry. Do not put any substances on the area afterward, such as powder, lotion, or perfume. Put on clean clothes or pajamas. If it is the night before your surgery, sleep in clean sheets.  For general bathing Follow these steps when using CHG cloths for general bathing (unless  your health care provider gives you different instructions). Use a separate CHG cloth for each area of your body. Make sure you wash between any folds of skin and between your fingers and toes. Wash your body in the following order, switching to a new cloth after each step: The front of your neck, shoulders, and chest. Both of your arms, under your arms, and your hands. Your stomach and groin area, avoiding the genitals. Your right leg and foot. Your left leg and foot. The back of your neck, your back, and your buttocks. Do not rinse. Discard the cloth and let the area air-dry. Do not put any substances on your body afterward--such as powder, lotion, or perfume--unless you are told to do so by your health care provider. Only use lotions that are recommended by the manufacturer. Put on clean clothes or pajamas. Contact a health care provider if: Your skin gets irritated after scrubbing. You have questions about using your solution or cloth. Get help right away if: Your eyes become very red or swollen. Your eyes itch badly. Your skin itches badly and is red or swollen. Your hearing changes. You have trouble seeing. You have swelling or tingling in your mouth or throat. You have trouble breathing. You  swallow any chlorhexidine. Summary Chlorhexidine gluconate (CHG) is a germ-killing (antiseptic) solution that is used to clean the skin. Cleaning your skin with CHG may help to lower your risk for infection. You may be given CHG to use for bathing. It may be in a bottle or in a prepackaged cloth to use on your skin. Carefully follow your health care provider's instructions and the instructions on the product label. Do not use CHG if you have a chlorhexidine allergy. Contact your health care provider if your skin gets irritated after scrubbing. This information is not intended to replace advice given to you by your health care provider. Make sure you discuss any questions you have with your healthcare provider. Document Revised: 10/21/2019 Document Reviewed: 11/25/2019 Elsevier Patient Education  [place]. Knee Arthroscopy, Care After This sheet gives you information about how to care for yourself after your procedure. Your health care provider may also give you more specific instructions. If you have problems or questions, contact your health careprovider. What can I expect after the procedure? After the procedure, it is common to have: Soreness. Swelling. Pain. A small amount of fluid from the incisions. Follow these instructions at home: Medicines Take over-the-counter and prescription medicines only as told by your health care provider. Ask your health care provider if the medicine prescribed to you: Requires you to avoid driving or using machinery. Can cause constipation. You may need to take these actions to prevent or treat constipation: Drink enough fluid to keep your urine pale yellow. Take over-the-counter or prescription medicines. Eat foods that are high in fiber, such as beans, whole grains, and fresh fruits and vegetables. Limit foods that are high in fat and processed sugars, such as fried or sweet foods. If you have a brace or immobilizer: Wear it as told by your  health care provider. Remove it only as told by your health care provider. Loosen it if your toes tingle, become numb, or turn cold and blue. Keep it clean and dry. Bathing Do not take baths, swim, or use a hot tub until your health care provider approves. Ask your health care provider if you may take showers. Keep your bandage (dressing) dry until your health care provider says that it can  be removed. If the brace or immobilizer is not waterproof: Do not let it get wet. Cover it with a watertight covering when you take a bath or shower. Incision care  Follow instructions from your health care provider about how to take care of your incisions. Make sure you: Wash your hands with soap and water for at least 20 seconds before and after you change your dressing. If soap and water are not available, use hand sanitizer. Change your dressing as told by your health care provider. Leave stitches (sutures) or adhesive strips in place. These skin closures may need to stay in place for 2 weeks or longer. If adhesive strip edges start to loosen and curl up, you may trim the loose edges. Do not remove adhesive strips completely unless your health care provider tells you to do that. Check your incision areas every day for signs of infection. Check for: Redness. More swelling or pain. Blood or more fluid. Warmth. Pus or a bad smell.  Managing pain, stiffness, and swelling  If directed, put ice on the injured area. To do this: If you have a removable brace or immobilizer, remove it as told by your health care provider. Put ice in a plastic bag or use the icing device (cold therapy unit) that you were given. Follow instructions about how to use the icing device. Place a towel between your skin and the bag or between your skin and the icing device. Leave the ice on for 20 minutes, 2-3 times a day. Remove the ice if your skin turns bright red. This is very important. If you cannot feel pain, heat, or cold,  you have a greater risk of damage to the area. Move your toes often to reduce stiffness and swelling. Raise (elevate) the injured area above the level of your heart while you are sitting or lying down.  Activity Do not use your knee to support your body weight until your health care provider says that you can. Follow weight-bearing restrictions as told. Use crutches or other devices to help you move around (assistive devices) as told by your health care provider. Ask your health care provider what activities are safe for you during recovery, and what activities you need to avoid. If physical therapy was prescribed, do exercises as told by your health care provider. Doing exercises may help improve knee movement, range of motion, and flexibility. Do not lift anything that is heavier than 10 lb (4.5 kg), or the limit that you are told, until your health care provider says that it is safe. General instructions Do not drive until your health care provider approves. You may be able to drive after 1-3 weeks. Do not use any products that contain nicotine or tobacco, such as cigarettes, e-cigarettes, and chewing tobacco. These can delay incision or bone healing after surgery. If you need help quitting, ask your health care provider. Wear compression stockings as told by your health care provider. These stockings help to prevent blood clots and reduce swelling in your legs. Keep all follow-up visits. This is important. Contact a health care provider if: You have any of these signs of infection: Redness or more pain around an incision. Blood or more fluid coming from an incision. Warmth coming from an incision. Pus or a bad smell coming from an incision. More swelling in your knee. A fever or chills. You have severe knee pain, and medicine does not help. An incision opens up. Get help right away if: You have trouble breathing  or shortness of breath. You have chest pain. You develop pain or swelling  in your lower leg or at the back of your knee. You have numbness or tingling in your lower leg or your foot. You notice that your foot or toes look darker than normal or are cooler than normal. These symptoms may represent a serious problem that is an emergency. Do not wait to see if the symptoms will go away. Get medical help right away. Call your local emergency services (911 in the [place] Do not drive yourself to the hospital. Summary To help relieve pain and swelling, put ice on the injured area for 20 minutes, 2-3 times a day. Raise (elevate) the injured area above the level of your heart while you are sitting or lying down. If physical therapy was prescribed, do exercises as told by your health care provider. Exercises may help improve range of motion. This information is not intended to replace advice given to you by your health care provider. Make sure you discuss any questions you have with your healthcare provider. Document Revised: 10/10/2019 Document Reviewed: 10/10/2019 Elsevier Patient Education  [place] Anesthesia, Adult, Care After This sheet gives you information about how to care for yourself after your procedure. Your health care provider may also give you more specific instructions. If you have problems or questions, contact your health careprovider. What can I expect after the procedure? After the procedure, the following side effects are common: Pain or discomfort at the IV site. Nausea. Vomiting. Sore throat. Trouble concentrating. Feeling cold or chills. Feeling weak or tired. Sleepiness and fatigue. Soreness and body aches. These side effects can affect parts of the body that were not involved in surgery. Follow these instructions at home: For the time period you were told by your health care provider:  Rest. Do not participate in activities where you could fall or become injured. Do not drive or use machinery. Do not drink alcohol. Do not  take sleeping pills or medicines that cause drowsiness. Do not make important decisions or sign legal documents. Do not take care of children on your own.  Eating and drinking Follow any instructions from your health care provider about eating or drinking restrictions. When you feel hungry, start by eating small amounts of foods that are soft and easy to digest (bland), such as toast. Gradually return to your regular diet. Drink enough fluid to keep your urine pale yellow. If you vomit, rehydrate by drinking water, juice, or clear broth. General instructions If you have sleep apnea, surgery and certain medicines can increase your risk for breathing problems. Follow instructions from your health care provider about wearing your sleep device: Anytime you are sleeping, including during daytime naps. While taking prescription pain medicines, sleeping medicines, or medicines that make you drowsy. Have a responsible adult stay with you for the time you are told. It is important to have someone help care for you until you are awake and alert. Return to your normal activities as told by your health care provider. Ask your health care provider what activities are safe for you. Take over-the-counter and prescription medicines only as told by your health care provider. If you smoke, do not smoke without supervision. Keep all follow-up visits as told by your health care provider. This is important. Contact a health care provider if: You have nausea or vomiting that does not get better with medicine. You cannot eat or drink without vomiting. You have pain that does not get  better with medicine. You are unable to pass urine. You develop a skin rash. You have a fever. You have redness around your IV site that gets worse. Get help right away if: You have difficulty breathing. You have chest pain. You have blood in your urine or stool, or you vomit blood. Summary After the procedure, it is common to  have a sore throat or nausea. It is also common to feel tired. Have a responsible adult stay with you for the time you are told. It is important to have someone help care for you until you are awake and alert. When you feel hungry, start by eating small amounts of foods that are soft and easy to digest (bland), such as toast. Gradually return to your regular diet. Drink enough fluid to keep your urine pale yellow. Return to your normal activities as told by your health care provider. Ask your health care provider what activities are safe for you. This information is not intended to replace advice given to you by your health care provider. Make sure you discuss any questions you have with your healthcare provider. Document Revised: 02/23/2020 Document Reviewed: 09/22/2019 Elsevier Patient Education  2022 [employer].

## 2021-01-21 ENCOUNTER — Telehealth: Payer: Self-pay | Admitting: Orthopedic Surgery

## 2021-01-21 ENCOUNTER — Encounter (HOSPITAL_COMMUNITY)
Admission: RE | Admit: 2021-01-21 | Discharge: 2021-01-21 | Disposition: A | Discharge status: Home / Self Care | Payer: Medicaid Other | Source: Ambulatory Visit | Attending: Orthopedic Surgery | Admitting: Orthopedic Surgery

## 2021-01-21 ENCOUNTER — Encounter (HOSPITAL_COMMUNITY): Payer: Self-pay

## 2021-01-21 NOTE — Telephone Encounter (Signed)
Patient relays, due to emergent situation, that he must cancel his surgery for tomorrow, 01/21/21; he will contact [name] to cancel today's pre-op visit. States he does not yet know when he can rescheduled.

## 2021-01-30 ENCOUNTER — Ambulatory Visit: Payer: Medicaid Other | Admitting: Orthopedic Surgery

## 2021-01-30 NOTE — Telephone Encounter (Signed)
Patient is here in clinic lobby to speak with Amy regarding surgery.

## 2021-01-30 NOTE — Telephone Encounter (Signed)
I have his number from his mom, I can call him back this afternoon.

## 2021-01-31 ENCOUNTER — Telehealth: Payer: Self-pay | Admitting: Radiology

## 2021-01-31 NOTE — Telephone Encounter (Signed)
-----   Message from Josue Hector sent at 01/31/2021  1:49 PM EDT ----- There isn't room to put him on 8/16.  ----- Message ----- From: Candice Camp, RT Sent: 01/30/2021   1:47 PM EDT To: Milus Banister, Terrell Bittman CXL for Aug 2nd, he wants to [facility] to Aug 16th, can you put him there? Or do I need to send new orders?

## 2021-01-31 NOTE — Telephone Encounter (Signed)
Reschedule

## 2021-02-12 ENCOUNTER — Telehealth: Payer: Self-pay | Admitting: Radiology

## 2021-02-12 NOTE — Telephone Encounter (Signed)
I called patient to RS his surgery from 02/19/21 to 03/01/21 he is agreeable to this day.

## 2021-02-15 ENCOUNTER — Encounter (HOSPITAL_COMMUNITY): Payer: Medicaid Other

## 2021-02-22 NOTE — Patient Instructions (Signed)
Henry Nguyen  02/22/2021     '@PREFPERIOPPHARMACY'$ @   Your procedure is scheduled on  03/01/2021.   Report to [name] at  [phone] A.M.   Call this number if you have problems the morning of surgery:  [phone]   Remember:  Do not eat or drink after midnight.        Take 23 units of your glargine the night before your procedure.      DO NOT take any medications for diabetes the morning of your procedure.    Use your inhaler before you come and bring your rescue inhaler with you.     Take these medicines the morning of surgery with A SIP OF WATER                       Amlodpine, clonidine, prilosec.     Do not wear jewelry, make-up or nail polish.  Do not wear lotions, powders, or perfumes, or deodorant.  Do not shave 48 hours prior to surgery.  Men may shave face and neck.  Do not bring valuables to the hospital.  [facility] is not responsible for any belongings or valuables.  Contacts, dentures or bridgework may not be worn into surgery.  Leave your suitcase in the car.  After surgery it may be brought to your room.  For patients admitted to the hospital, discharge time will be determined by your treatment team.  Patients discharged the day of surgery will not be allowed to drive home and must have someone with them for 24 hours.   Special instructions:   DO NOT smoke tobacco or vape for 24 hours before your procedure.  Please read over the following fact sheets that you were given. Coughing and Deep Breathing, Surgical Site Infection Prevention, Anesthesia Post-op Instructions, and Care and Recovery After Surgery      Arthroscopic Knee Ligament Repair, Care After This sheet gives you information about how to care for yourself after your procedure. Your health care provider may also give you more specific instructions. If you have problems or questions, contact your health care provider. What can I expect after the procedure? After the procedure,  it is common to have: Soreness or pain in your knee. Bruising and swelling on your knee, calf, and ankle for 3-4 days. A small amount of fluid coming from the incisions. Follow these instructions at home: Medicines Take over-the-counter and prescription medicines only as told by your health care provider. Ask your health care provider if the medicine prescribed to you: Requires you to avoid driving or using machinery. Can cause constipation. You may need to take these actions to prevent or treat constipation: Drink enough fluid to keep your urine pale yellow. Take over-the-counter or prescription medicines. Eat foods that are high in fiber, such as beans, whole grains, and fresh fruits and vegetables. Limit foods that are high in fat and processed sugars, such as fried or sweet foods. If you have a brace or immobilizer: Wear it as told by your health care provider. Remove it only as told by your health care provider. Loosen it if your toes tingle, become numb, or turn cold and blue. Keep it clean and dry. Ask your health care provider when it is safe to drive. Bathing Do not take baths, swim, or use a hot tub until your health care provider approves. Keep your bandage (dressing) dry until your health care provider says that it  can be removed. If the brace or immobilizer is not waterproof: Do not let it get wet. Cover it with a watertight covering when you take a bath or shower. Incision care  Follow instructions from your health care provider about how to take care of your incisions. Make sure you: Wash your hands with soap and water for at least 20 seconds before and after you change your dressing. If soap and water are not available, use hand sanitizer. Change your dressing as told by your health care provider. Leave stitches (sutures), skin glue, or adhesive strips in place. These skin closures may need to stay in place for 2 weeks or longer. If adhesive strip edges start to loosen  and curl up, you may trim the loose edges. Do not remove adhesive strips completely unless your health care provider tells you to do that. Check your incision areas every day for signs of infection. Check for: Redness. More swelling or pain. Blood or more fluid. Warmth. Pus or a bad smell. Managing pain, stiffness, and swelling  If directed, put ice on the affected area. To do this: If you have a removable brace or immobilizer, remove it as told by your health care provider. Put ice in a plastic bag. Place a towel between your skin and the bag. Leave the ice on for 20 minutes, 2-3 times a day. Remove the ice if your skin turns bright red. This is very important. If you cannot feel pain, heat, or cold, you have a greater risk of damage to the area. Move your toes often to reduce stiffness and swelling. Raise (elevate) the injured area above the level of your heart while you are sitting or lying down. Activity Do not use your knee to support your body weight until your health care provider says that you can. Use crutches or other devices as told by your health care provider. Do physical therapy exercises as told by your health care provider. Physical therapy will help you regain movement and strength in your knee. Follow instructions from your health care provider about: When you may start motion exercises. When you may start riding a stationary bike and doing other low-impact activities. When you may start to jog and do other high-impact activities. Do not lift anything that is heavier than 10 lb (4.5 kg), or the limit that you are told, until your health care provider says that it is safe. Ask your health care provider what activities are safe for you. General instructions Do not use any products that contain nicotine or tobacco, such as cigarettes, e-cigarettes, and chewing tobacco. These can delay healing. If you need help quitting, ask your health care provider. Wear compression  stockings as told by your health care provider. These stockings help to prevent blood clots and reduce swelling in your legs. Keep all follow-up visits. This is important. Contact a health care provider if: You have any of these signs of infection: Redness around an incision. Blood or more fluid coming from an incision. Warmth coming from an incision. Pus or a bad smell coming from an incision. More swelling or pain in your knee. A fever or chills. You have pain that does not get better with medicine. Your incision opens up. Get help right away if: You have trouble breathing. You have chest pain. You have increased pain or swelling in your calf or at the back of your knee. You have numbness and tingling near the knee joint or in the foot, ankle, or toes. You  notice that your foot or toes look darker than normal or are cooler than normal. These symptoms may represent a serious problem that is an emergency. Do not wait to see if the symptoms will go away. Get medical help right away. Call your local emergency services (911 in the [place] Do not drive yourself to the hospital. Summary After the procedure, it is common to have knee pain with bruising and swelling on your knee, calf, and ankle. Icing your knee and raising your leg above the level of your heart will help control the pain and swelling. Do physical therapy exercises as told by your health care provider. Physical therapy will help you regain movement and strength in your knee. This information is not intended to replace advice given to you by your health care provider. Make sure you discuss any questions you have with your health care provider. Document Revised: 11/07/2019 Document Reviewed: 11/07/2019 Elsevier Patient Education  [place] Anesthesia, Adult, Care After This sheet gives you information about how to care for yourself after your procedure. Your health care provider may also give you more specific  instructions. If you have problems or questions, contact your health care provider. What can I expect after the procedure? After the procedure, the following side effects are common: Pain or discomfort at the IV site. Nausea. Vomiting. Sore throat. Trouble concentrating. Feeling cold or chills. Feeling weak or tired. Sleepiness and fatigue. Soreness and body aches. These side effects can affect parts of the body that were not involved in surgery. Follow these instructions at home: For the time period you were told by your health care provider:  Rest. Do not participate in activities where you could fall or become injured. Do not drive or use machinery. Do not drink alcohol. Do not take sleeping pills or medicines that cause drowsiness. Do not make important decisions or sign legal documents. Do not take care of children on your own. Eating and drinking Follow any instructions from your health care provider about eating or drinking restrictions. When you feel hungry, start by eating small amounts of foods that are soft and easy to digest (bland), such as toast. Gradually return to your regular diet. Drink enough fluid to keep your urine pale yellow. If you vomit, rehydrate by drinking water, juice, or clear broth. General instructions If you have sleep apnea, surgery and certain medicines can increase your risk for breathing problems. Follow instructions from your health care provider about wearing your sleep device: Anytime you are sleeping, including during daytime naps. While taking prescription pain medicines, sleeping medicines, or medicines that make you drowsy. Have a responsible adult stay with you for the time you are told. It is important to have someone help care for you until you are awake and alert. Return to your normal activities as told by your health care provider. Ask your health care provider what activities are safe for you. Take over-the-counter and prescription  medicines only as told by your health care provider. If you smoke, do not smoke without supervision. Keep all follow-up visits as told by your health care provider. This is important. Contact a health care provider if: You have nausea or vomiting that does not get better with medicine. You cannot eat or drink without vomiting. You have pain that does not get better with medicine. You are unable to pass urine. You develop a skin rash. You have a fever. You have redness around your IV site that gets worse. Get help right  away if: You have difficulty breathing. You have chest pain. You have blood in your urine or stool, or you vomit blood. Summary After the procedure, it is common to have a sore throat or nausea. It is also common to feel tired. Have a responsible adult stay with you for the time you are told. It is important to have someone help care for you until you are awake and alert. When you feel hungry, start by eating small amounts of foods that are soft and easy to digest (bland), such as toast. Gradually return to your regular diet. Drink enough fluid to keep your urine pale yellow. Return to your normal activities as told by your health care provider. Ask your health care provider what activities are safe for you. This information is not intended to replace advice given to you by your health care provider. Make sure you discuss any questions you have with your health care provider. Document Revised: 02/23/2020 Document Reviewed: 09/22/2019 Elsevier Patient Education  2022 [employer]. How to Use Chlorhexidine for Bathing Chlorhexidine gluconate (CHG) is a germ-killing (antiseptic) solution that is used to clean the skin. It can get rid of the bacteria that normally live on the skin and can keep them away for about 24 hours. To clean your skin with CHG, you may be given: A CHG solution to use in the shower or as part of a sponge bath. A prepackaged cloth that contains  CHG. Cleaning your skin with CHG may help lower the risk for infection: While you are staying in the intensive care unit of the hospital. If you have a vascular access, such as a central line, to provide short-term or long-term access to your veins. If you have a catheter to drain urine from your bladder. If you are on a ventilator. A ventilator is a machine that helps you breathe by moving air in and out of your lungs. After surgery. What are the risks? Risks of using CHG include: A skin reaction. Hearing loss, if CHG gets in your ears and you have a perforated eardrum. Eye injury, if CHG gets in your eyes and is not rinsed out. The CHG product catching fire. Make sure that you avoid smoking and flames after applying CHG to your skin. Do not use CHG: If you have a chlorhexidine allergy or have previously reacted to chlorhexidine. On babies younger than 41 months of age. How to use CHG solution Use CHG only as told by your health care provider, and follow the instructions on the label. Use the full amount of CHG as directed. Usually, this is one bottle. During a shower Follow these steps when using CHG solution during a shower (unless your health care provider gives you different instructions): Start the shower. Use your normal soap and shampoo to wash your face and hair. Turn off the shower or move out of the shower stream. Pour the CHG onto a clean washcloth. Do not use any type of brush or rough-edged sponge. Starting at your neck, lather your body down to your toes. Make sure you follow these instructions: If you will be having surgery, pay special attention to the part of your body where you will be having surgery. Scrub this area for at least 1 minute. Do not use CHG on your head or face. If the solution gets into your ears or eyes, rinse them well with water. Avoid your genital area. Avoid any areas of skin that have broken skin, cuts, or scrapes. Scrub your back  and under your  arms. Make sure to wash skin folds. Let the lather sit on your skin for 1-2 minutes or as long as told by your health care provider. Thoroughly rinse your entire body in the shower. Make sure that all body creases and crevices are rinsed well. Dry off with a clean towel. Do not put any substances on your body afterward--such as powder, lotion, or perfume--unless you are told to do so by your health care provider. Only use lotions that are recommended by the manufacturer. Put on clean clothes or pajamas. If it is the night before your surgery, sleep in clean sheets.  During a sponge bath Follow these steps when using CHG solution during a sponge bath (unless your health care provider gives you different instructions): Use your normal soap and shampoo to wash your face and hair. Pour the CHG onto a clean washcloth. Starting at your neck, lather your body down to your toes. Make sure you follow these instructions: If you will be having surgery, pay special attention to the part of your body where you will be having surgery. Scrub this area for at least 1 minute. Do not use CHG on your head or face. If the solution gets into your ears or eyes, rinse them well with water. Avoid your genital area. Avoid any areas of skin that have broken skin, cuts, or scrapes. Scrub your back and under your arms. Make sure to wash skin folds. Let the lather sit on your skin for 1-2 minutes or as long as told by your health care provider. Using a different clean, wet washcloth, thoroughly rinse your entire body. Make sure that all body creases and crevices are rinsed well. Dry off with a clean towel. Do not put any substances on your body afterward--such as powder, lotion, or perfume--unless you are told to do so by your health care provider. Only use lotions that are recommended by the manufacturer. Put on clean clothes or pajamas. If it is the night before your surgery, sleep in clean sheets. How to use CHG  prepackaged cloths Only use CHG cloths as told by your health care provider, and follow the instructions on the label. Use the CHG cloth on clean, dry skin. Do not use the CHG cloth on your head or face unless your health care provider tells you to. When washing with the CHG cloth: Avoid your genital area. Avoid any areas of skin that have broken skin, cuts, or scrapes. Before surgery Follow these steps when using a CHG cloth to clean before surgery (unless your health care provider gives you different instructions): Using the CHG cloth, vigorously scrub the part of your body where you will be having surgery. Scrub using a back-and-forth motion for 3 minutes. The area on your body should be completely wet with CHG when you are done scrubbing. Do not rinse. Discard the cloth and let the area air-dry. Do not put any substances on the area afterward, such as powder, lotion, or perfume. Put on clean clothes or pajamas. If it is the night before your surgery, sleep in clean sheets.  For general bathing Follow these steps when using CHG cloths for general bathing (unless your health care provider gives you different instructions). Use a separate CHG cloth for each area of your body. Make sure you wash between any folds of skin and between your fingers and toes. Wash your body in the following order, switching to a new cloth after each step: The front of your  neck, shoulders, and chest. Both of your arms, under your arms, and your hands. Your stomach and groin area, avoiding the genitals. Your right leg and foot. Your left leg and foot. The back of your neck, your back, and your buttocks. Do not rinse. Discard the cloth and let the area air-dry. Do not put any substances on your body afterward--such as powder, lotion, or perfume--unless you are told to do so by your health care provider. Only use lotions that are recommended by the manufacturer. Put on clean clothes or pajamas. Contact a health  care provider if: Your skin gets irritated after scrubbing. You have questions about using your solution or cloth. You swallow any chlorhexidine. Call your local poison control center (1-201 319 8003 in the [place] Get help right away if: Your eyes itch badly, or they become very red or swollen. Your skin itches badly and is red or swollen. Your hearing changes. You have trouble seeing. You have swelling or tingling in your mouth or throat. You have trouble breathing. These symptoms may represent a serious problem that is an emergency. Do not wait to see if the symptoms will go away. Get medical help right away. Call your local emergency services (911 in the [place] Do not drive yourself to the hospital. Summary Chlorhexidine gluconate (CHG) is a germ-killing (antiseptic) solution that is used to clean the skin. Cleaning your skin with CHG may help to lower your risk for infection. You may be given CHG to use for bathing. It may be in a bottle or in a prepackaged cloth to use on your skin. Carefully follow your health care provider's instructions and the instructions on the product label. Do not use CHG if you have a chlorhexidine allergy. Contact your health care provider if your skin gets irritated after scrubbing. This information is not intended to replace advice given to you by your health care provider. Make sure you discuss any questions you have with your health care provider. Document Revised: 08/20/2020 Document Reviewed: 08/20/2020 Elsevier Patient Education  2022 [employer].

## 2021-02-27 ENCOUNTER — Encounter (HOSPITAL_COMMUNITY)
Admission: RE | Admit: 2021-02-27 | Discharge: 2021-02-27 | Disposition: A | Discharge status: Home / Self Care | Payer: Medicaid Other | Source: Ambulatory Visit | Attending: Orthopedic Surgery | Admitting: Orthopedic Surgery

## 2021-02-27 ENCOUNTER — Other Ambulatory Visit: Payer: Self-pay

## 2021-02-27 ENCOUNTER — Encounter (HOSPITAL_COMMUNITY): Payer: Self-pay

## 2021-02-27 DIAGNOSIS — Z01812 Encounter for preprocedural laboratory examination: Secondary | ICD-10-CM | POA: Insufficient documentation

## 2021-02-27 HISTORY — DX: Unspecified osteoarthritis, unspecified site: M19.90

## 2021-02-27 LAB — BASIC METABOLIC PANEL
Anion gap: 11 (ref 5–15)
BUN: 23 mg/dL — ABNORMAL HIGH (ref 6–20)
CO2: 28 mmol/L (ref 22–32)
Calcium: 9.1 mg/dL (ref 8.9–10.3)
Chloride: 104 mmol/L (ref 98–111)
Creatinine, Ser: 1.08 mg/dL (ref 0.61–1.24)
GFR, Estimated: >60 mL/min (ref >60)
Glucose, Bld: 103 mg/dL — ABNORMAL HIGH (ref 70–99)
Potassium: 3.9 mmol/L (ref 3.5–5.1)
Sodium: 143 mmol/L (ref 135–145)

## 2021-02-27 LAB — CBC WITH DIFFERENTIAL/PLATELET
Abs Immature Granulocytes: 0.02 10*3/uL (ref 0.00–0.07)
Basophils Absolute: 0.1 10*3/uL (ref 0.0–0.1)
Basophils Relative: 1 %
Eosinophils Absolute: 0.1 10*3/uL (ref 0.0–0.5)
Eosinophils Relative: 1 %
HCT: 39.8 % (ref 39.0–52.0)
Hemoglobin: 12.5 g/dL — ABNORMAL LOW (ref 13.0–17.0)
Immature Granulocytes: 0 %
Lymphocytes Relative: 28 %
Lymphs Abs: 1.8 10*3/uL (ref 0.7–4.0)
MCH: 27 pg (ref 26.0–34.0)
MCHC: 31.4 g/dL (ref 30.0–36.0)
MCV: 86 fL (ref 80.0–100.0)
Monocytes Absolute: 0.6 10*3/uL (ref 0.1–1.0)
Monocytes Relative: 9 %
Neutro Abs: 3.9 10*3/uL (ref 1.7–7.7)
Neutrophils Relative %: 61 %
Platelets: 248 10*3/uL (ref 150–400)
RBC: 4.63 MIL/uL (ref 4.22–5.81)
RDW: 14.6 % (ref 11.5–15.5)
WBC: 6.5 10*3/uL (ref 4.0–10.5)
nRBC: 0 % (ref 0.0–0.2)

## 2021-02-27 LAB — HEMOGLOBIN A1C
Hgb A1c MFr Bld: 6.6 % — ABNORMAL HIGH (ref 4.8–5.6)
Mean Plasma Glucose: 142.72 mg/dL

## 2021-03-01 ENCOUNTER — Ambulatory Visit (HOSPITAL_COMMUNITY): Payer: Medicaid Other | Admitting: Certified Registered"

## 2021-03-01 ENCOUNTER — Ambulatory Visit (HOSPITAL_COMMUNITY)
Admission: RE | Admit: 2021-03-01 | Discharge: 2021-03-01 | Disposition: A | Discharge status: Home / Self Care | Payer: Medicaid Other | Attending: Orthopedic Surgery | Admitting: Orthopedic Surgery

## 2021-03-01 ENCOUNTER — Encounter: Payer: Self-pay | Admitting: Orthopedic Surgery

## 2021-03-01 ENCOUNTER — Encounter (HOSPITAL_COMMUNITY)
Admission: RE | Disposition: A | Discharge status: Home / Self Care | Payer: Self-pay | Source: Home / Self Care | Attending: Orthopedic Surgery

## 2021-03-01 ENCOUNTER — Encounter (HOSPITAL_COMMUNITY): Payer: Self-pay | Admitting: Orthopedic Surgery

## 2021-03-01 DIAGNOSIS — S83231A Complex tear of medial meniscus, current injury, right knee, initial encounter: Secondary | ICD-10-CM | POA: Diagnosis not present

## 2021-03-01 DIAGNOSIS — M1711 Unilateral primary osteoarthritis, right knee: Secondary | ICD-10-CM | POA: Insufficient documentation

## 2021-03-01 DIAGNOSIS — Z7984 Long term (current) use of oral hypoglycemic drugs: Secondary | ICD-10-CM | POA: Insufficient documentation

## 2021-03-01 DIAGNOSIS — M7121 Synovial cyst of popliteal space [Baker], right knee: Secondary | ICD-10-CM | POA: Diagnosis not present

## 2021-03-01 DIAGNOSIS — Z7982 Long term (current) use of aspirin: Secondary | ICD-10-CM | POA: Diagnosis not present

## 2021-03-01 DIAGNOSIS — S83241D Other tear of medial meniscus, current injury, right knee, subsequent encounter: Secondary | ICD-10-CM

## 2021-03-01 DIAGNOSIS — Z794 Long term (current) use of insulin: Secondary | ICD-10-CM | POA: Insufficient documentation

## 2021-03-01 DIAGNOSIS — X58XXXA Exposure to other specified factors, initial encounter: Secondary | ICD-10-CM | POA: Diagnosis not present

## 2021-03-01 DIAGNOSIS — Z79899 Other long term (current) drug therapy: Secondary | ICD-10-CM | POA: Insufficient documentation

## 2021-03-01 DIAGNOSIS — M84351A Stress fracture, right femur, initial encounter for fracture: Secondary | ICD-10-CM | POA: Insufficient documentation

## 2021-03-01 DIAGNOSIS — S83241A Other tear of medial meniscus, current injury, right knee, initial encounter: Secondary | ICD-10-CM | POA: Diagnosis present

## 2021-03-01 DIAGNOSIS — S83242A Other tear of medial meniscus, current injury, left knee, initial encounter: Secondary | ICD-10-CM

## 2021-03-01 HISTORY — PX: KNEE ARTHROSCOPY WITH MEDIAL MENISECTOMY: SHX5651

## 2021-03-01 LAB — GLUCOSE, CAPILLARY
Glucose-Capillary: 127 mg/dL — ABNORMAL HIGH (ref 70–99)
Glucose-Capillary: 153 mg/dL — ABNORMAL HIGH (ref 70–99)

## 2021-03-01 SURGERY — ARTHROSCOPY, KNEE, WITH MEDIAL MENISCECTOMY
Anesthesia: General | Site: Knee | Laterality: Right

## 2021-03-01 MED ORDER — ONDANSETRON HCL 4 MG/2ML IJ SOLN
INTRAMUSCULAR | Status: AC
  Filled 2021-03-01: qty 2

## 2021-03-01 MED ORDER — BUPIVACAINE-EPINEPHRINE (PF) 0.5% -1:200000 IJ SOLN
INTRAMUSCULAR | Status: AC
  Filled 2021-03-01: qty 30

## 2021-03-01 MED ORDER — ONDANSETRON HCL 4 MG/2ML IJ SOLN
INTRAMUSCULAR | Status: DC | PRN
  Administered 2021-03-01: 4 mg via INTRAVENOUS

## 2021-03-01 MED ORDER — FENTANYL CITRATE (PF) 100 MCG/2ML IJ SOLN
INTRAMUSCULAR | Status: DC | PRN
  Administered 2021-03-01 (×2): 50 ug via INTRAVENOUS

## 2021-03-01 MED ORDER — FENTANYL CITRATE PF 50 MCG/ML IJ SOSY: 25.0000 ug | PREFILLED_SYRINGE | INTRAMUSCULAR | Status: DC | PRN

## 2021-03-01 MED ORDER — EPHEDRINE 5 MG/ML INJ
INTRAVENOUS | Status: AC
  Filled 2021-03-01: qty 5

## 2021-03-01 MED ORDER — DEXAMETHASONE SODIUM PHOSPHATE 10 MG/ML IJ SOLN
INTRAMUSCULAR | Status: AC
  Filled 2021-03-01: qty 1

## 2021-03-01 MED ORDER — FENTANYL CITRATE (PF) 250 MCG/5ML IJ SOLN
INTRAMUSCULAR | Status: AC
  Filled 2021-03-01: qty 5

## 2021-03-01 MED ORDER — POVIDONE-IODINE 10 % EX SWAB: 2.0000 "application " | Freq: Once | CUTANEOUS | Status: DC

## 2021-03-01 MED ORDER — ACETAMINOPHEN 500 MG PO TABS
500.0000 mg | ORAL_TABLET | Freq: Once | ORAL | Status: DC
  Filled 2021-03-01: qty 1

## 2021-03-01 MED ORDER — KETOROLAC TROMETHAMINE 30 MG/ML IJ SOLN
INTRAMUSCULAR | Status: DC | PRN
  Administered 2021-03-01: 30 mg via INTRAVENOUS

## 2021-03-01 MED ORDER — MIDAZOLAM HCL 2 MG/2ML IJ SOLN
INTRAMUSCULAR | Status: AC
  Filled 2021-03-01: qty 2

## 2021-03-01 MED ORDER — LACTATED RINGERS IV SOLN
INTRAVENOUS | Status: DC
  Administered 2021-03-01: 1000 mL via INTRAVENOUS

## 2021-03-01 MED ORDER — GLYCOPYRROLATE PF 0.2 MG/ML IJ SOSY
PREFILLED_SYRINGE | INTRAMUSCULAR | Status: DC | PRN
  Administered 2021-03-01 (×2): .1 mg via INTRAVENOUS

## 2021-03-01 MED ORDER — GLYCOPYRROLATE PF 0.2 MG/ML IJ SOSY
PREFILLED_SYRINGE | INTRAMUSCULAR | Status: AC
  Filled 2021-03-01: qty 1

## 2021-03-01 MED ORDER — EPHEDRINE SULFATE-NACL 50-0.9 MG/10ML-% IV SOSY
PREFILLED_SYRINGE | INTRAVENOUS | Status: DC | PRN
  Administered 2021-03-01 (×2): 5 mg via INTRAVENOUS

## 2021-03-01 MED ORDER — HYDROCODONE-ACETAMINOPHEN 10-325 MG PO TABS: 1.0000 | ORAL_TABLET | ORAL | 0 refills | Status: DC | PRN

## 2021-03-01 MED ORDER — PROPOFOL 10 MG/ML IV BOLUS
INTRAVENOUS | Status: DC | PRN
Start: 2021-03-01 — End: 2021-03-01
  Administered 2021-03-01: 170 mg via INTRAVENOUS

## 2021-03-01 MED ORDER — CHLORHEXIDINE GLUCONATE 0.12 % MT SOLN
15.0000 mL | Freq: Once | OROMUCOSAL | Status: AC
  Administered 2021-03-01: 15 mL via OROMUCOSAL

## 2021-03-01 MED ORDER — BUPIVACAINE-EPINEPHRINE (PF) 0.5% -1:200000 IJ SOLN
INTRAMUSCULAR | Status: DC | PRN
  Administered 2021-03-01: 30 mL

## 2021-03-01 MED ORDER — ORAL CARE MOUTH RINSE: 15.0000 mL | Freq: Once | OROMUCOSAL | Status: AC

## 2021-03-01 MED ORDER — ONDANSETRON HCL 4 MG/2ML IJ SOLN: 4.0000 mg | Freq: Once | INTRAMUSCULAR | Status: DC | PRN

## 2021-03-01 MED ORDER — HYDROCODONE-ACETAMINOPHEN 10-325 MG PO TABS
1.0000 | ORAL_TABLET | Freq: Once | ORAL | Status: AC
Start: 2021-03-01 — End: 2021-03-01
  Administered 2021-03-01: 1 via ORAL
  Filled 2021-03-01: qty 1

## 2021-03-01 MED ORDER — SODIUM CHLORIDE 0.9 % IR SOLN
Status: DC | PRN
  Administered 2021-03-01: 2 mL

## 2021-03-01 MED ORDER — CHLORHEXIDINE GLUCONATE 4 % EX LIQD: 60.0000 mL | Freq: Once | CUTANEOUS | Status: DC

## 2021-03-01 MED ORDER — PHENYLEPHRINE 40 MCG/ML (10ML) SYRINGE FOR IV PUSH (FOR BLOOD PRESSURE SUPPORT)
PREFILLED_SYRINGE | INTRAVENOUS | Status: DC | PRN
  Administered 2021-03-01 (×3): 80 ug via INTRAVENOUS
  Administered 2021-03-01: 120 ug via INTRAVENOUS
  Administered 2021-03-01 (×2): 80 ug via INTRAVENOUS

## 2021-03-01 MED ORDER — PHENYLEPHRINE 40 MCG/ML (10ML) SYRINGE FOR IV PUSH (FOR BLOOD PRESSURE SUPPORT)
PREFILLED_SYRINGE | INTRAVENOUS | Status: AC
  Filled 2021-03-01: qty 10

## 2021-03-01 MED ORDER — LIDOCAINE 2% (20 MG/ML) 5 ML SYRINGE
INTRAMUSCULAR | Status: DC | PRN
  Administered 2021-03-01: 100 mg via INTRAVENOUS

## 2021-03-01 MED ORDER — EPINEPHRINE PF 1 MG/ML IJ SOLN
INTRAMUSCULAR | Status: AC
  Filled 2021-03-01: qty 3

## 2021-03-01 MED ORDER — CEFAZOLIN SODIUM-DEXTROSE 2-4 GM/100ML-% IV SOLN
2.0000 g | INTRAVENOUS | Status: AC
  Administered 2021-03-01: 2 g via INTRAVENOUS
  Filled 2021-03-01: qty 100

## 2021-03-01 MED ORDER — KETOROLAC TROMETHAMINE 30 MG/ML IJ SOLN
INTRAMUSCULAR | Status: AC
  Filled 2021-03-01: qty 1

## 2021-03-01 MED ORDER — LIDOCAINE HCL (PF) 2 % IJ SOLN
INTRAMUSCULAR | Status: AC
  Filled 2021-03-01: qty 5

## 2021-03-01 MED ORDER — DEXAMETHASONE SODIUM PHOSPHATE 4 MG/ML IJ SOLN
INTRAMUSCULAR | Status: DC | PRN
  Administered 2021-03-01: 4 mg via INTRAVENOUS

## 2021-03-01 MED ORDER — MIDAZOLAM HCL 5 MG/5ML IJ SOLN
INTRAMUSCULAR | Status: DC | PRN
  Administered 2021-03-01 (×2): 1 mg via INTRAVENOUS

## 2021-03-01 MED ORDER — PROPOFOL 10 MG/ML IV BOLUS
INTRAVENOUS | Status: AC
  Filled 2021-03-01: qty 40

## 2021-03-01 MED ORDER — SODIUM CHLORIDE 0.9 % IR SOLN
Status: DC | PRN
  Administered 2021-03-01: 6000 mL

## 2021-03-01 SURGICAL SUPPLY — 55 items
ABLATOR ASPIRATE 50D MULTI-PRT (SURGICAL WAND) ×2 IMPLANT
APL PRP STRL LF DISP 70% ISPRP (MISCELLANEOUS) ×1
BAG HAMPER (MISCELLANEOUS) ×2 IMPLANT
BLADE SURG SZ11 CARB STEEL (BLADE) ×2 IMPLANT
BNDG CMPR MED 15X6 ELC VLCR LF (GAUZE/BANDAGES/DRESSINGS) ×1
BNDG CMPR STD VLCR NS LF 5.8X6 (GAUZE/BANDAGES/DRESSINGS) ×1
BNDG ELASTIC 6X15 VLCR STRL LF (GAUZE/BANDAGES/DRESSINGS) ×1 IMPLANT
BNDG ELASTIC 6X5.8 VLCR NS LF (GAUZE/BANDAGES/DRESSINGS) ×2 IMPLANT
CHLORAPREP W/TINT 26 (MISCELLANEOUS) ×2 IMPLANT
CLOTH BEACON ORANGE TIMEOUT ST (SAFETY) ×2 IMPLANT
COOLER ICEMAN CLASSIC (MISCELLANEOUS) ×2 IMPLANT
CUFF TOURN SGL QUICK 34 (TOURNIQUET CUFF)
CUFF TRNQT CYL 34X4.125X (TOURNIQUET CUFF) IMPLANT
DECANTER SPIKE VIAL GLASS SM (MISCELLANEOUS) ×4 IMPLANT
DISSECTOR 4.0MM X 13CM (MISCELLANEOUS) ×1 IMPLANT
DRAPE HALF SHEET 40X57 (DRAPES) ×2 IMPLANT
DRESSING XEROFORM 5X9 (GAUZE/BANDAGES/DRESSINGS) ×1 IMPLANT
GAUZE 4X4 16PLY ~~LOC~~+RFID DBL (SPONGE) ×2 IMPLANT
GAUZE SPONGE 4X4 12PLY STRL (GAUZE/BANDAGES/DRESSINGS) ×2 IMPLANT
GAUZE SPONGE 4X4 16PLY XRAY LF (GAUZE/BANDAGES/DRESSINGS) ×2 IMPLANT
GAUZE XEROFORM 5X9 LF (GAUZE/BANDAGES/DRESSINGS) ×2 IMPLANT
GLOVE SS N UNI LF 8.5 STRL (GLOVE) ×2 IMPLANT
GLOVE SURG POLYISO LF SZ8 (GLOVE) ×2 IMPLANT
GLOVE SURG UNDER POLY LF SZ7 (GLOVE) ×4 IMPLANT
GOWN STRL REUS W/TWL LRG LVL3 (GOWN DISPOSABLE) ×2 IMPLANT
GOWN STRL REUS W/TWL XL LVL3 (GOWN DISPOSABLE) ×2 IMPLANT
IV NS IRRIG 3000ML ARTHROMATIC (IV SOLUTION) ×4 IMPLANT
KIT BLADEGUARD II DBL (SET/KITS/TRAYS/PACK) ×2 IMPLANT
KIT TURNOVER CYSTO (KITS) ×2 IMPLANT
MANIFOLD NEPTUNE II (INSTRUMENTS) ×2 IMPLANT
MARKER SKIN DUAL TIP RULER LAB (MISCELLANEOUS) ×2 IMPLANT
NDL HYPO 18GX1.5 BLUNT FILL (NEEDLE) ×1 IMPLANT
NDL HYPO 21X1.5 SAFETY (NEEDLE) ×1 IMPLANT
NDL SPNL 18GX3.5 QUINCKE PK (NEEDLE) ×1 IMPLANT
NEEDLE HYPO 18GX1.5 BLUNT FILL (NEEDLE) ×2 IMPLANT
NEEDLE HYPO 21X1.5 SAFETY (NEEDLE) ×2 IMPLANT
NEEDLE SPNL 18GX3.5 QUINCKE PK (NEEDLE) ×2 IMPLANT
NS IRRIG 1000ML POUR BTL (IV SOLUTION) ×2 IMPLANT
PACK ARTHRO LIMB DRAPE STRL (MISCELLANEOUS) ×2 IMPLANT
PAD ABD 5X9 TENDERSORB (GAUZE/BANDAGES/DRESSINGS) ×2 IMPLANT
PAD ABD 8X10 STRL (GAUZE/BANDAGES/DRESSINGS) ×1 IMPLANT
PAD ARMBOARD 7.5X6 YLW CONV (MISCELLANEOUS) ×2 IMPLANT
PAD COLD SHLDR WRAP-ON (PAD) ×1 IMPLANT
PAD FOR LEG HOLDER (MISCELLANEOUS) ×2 IMPLANT
PADDING CAST COTTON 6X4 STRL (CAST SUPPLIES) ×2 IMPLANT
PADDING WEBRIL 6 STERILE (GAUZE/BANDAGES/DRESSINGS) ×1 IMPLANT
PORT APPOLLO RF 90DEGREE MULTI (SURGICAL WAND) IMPLANT
SET ARTHROSCOPY INST (INSTRUMENTS) ×2 IMPLANT
SET BASIN LINEN APH (SET/KITS/TRAYS/PACK) ×2 IMPLANT
SPONGE GAUZE 4X4 FOR O.R. (GAUZE/BANDAGES/DRESSINGS) ×1 IMPLANT
SUT ETHILON 3 0 FSL (SUTURE) ×2 IMPLANT
SYR 10ML LL (SYRINGE) ×2 IMPLANT
SYR 30ML LL (SYRINGE) ×2 IMPLANT
TUBE CONNECTING 12X1/4 (SUCTIONS) ×4 IMPLANT
TUBING IN/OUT FLOW W/MAIN PUMP (TUBING) ×2 IMPLANT

## 2021-03-01 NOTE — Anesthesia Procedure Notes (Addendum)
Procedure Name: LMA Insertion Date/Time: 03/01/2021 7:32 AM Performed by: Orlie Dakin, CRNA Pre-anesthesia Checklist: Patient identified, Emergency Drugs available, Suction available and Patient being monitored Patient Re-evaluated:Patient Re-evaluated prior to induction Oxygen Delivery Method: Circle system utilized Preoxygenation: Pre-oxygenation with 100% oxygen Induction Type: IV induction LMA: LMA inserted LMA Size: 5.0 Tube type: Oral Number of attempts: 1 Placement Confirmation: positive ETCO2 Tube secured with: Tape Dental Injury: Teeth and Oropharynx as per pre-operative assessment

## 2021-03-01 NOTE — Anesthesia Preprocedure Evaluation (Signed)
Anesthesia Evaluation  Patient identified by MRN, date of birth, ID band Patient awake    Reviewed: Allergy & Precautions, H&P , NPO status , Patient's Chart, lab work & pertinent test results, reviewed documented beta blocker date and time   Airway Mallampati: II  TM Distance: >3 FB Neck ROM: full    Dental no notable dental hx.    Pulmonary neg pulmonary ROS,    Pulmonary exam normal breath sounds clear to auscultation       Cardiovascular Exercise Tolerance: Good hypertension, negative cardio ROS   Rhythm:regular Rate:Normal     Neuro/Psych negative neurological ROS  negative psych ROS   GI/Hepatic Neg liver ROS, GERD  Medicated,  Endo/Other  negative endocrine ROSdiabetes, Type 2  Renal/GU negative Renal ROS  negative genitourinary   Musculoskeletal   Abdominal   Peds  Hematology  (+) Blood dyscrasia, anemia ,   Anesthesia Other Findings   Reproductive/Obstetrics negative OB ROS                             Anesthesia Physical Anesthesia Plan  ASA: 2  Anesthesia Plan: General and General LMA   Post-op Pain Management:    Induction:   PONV Risk Score and Plan: Ondansetron  Airway Management Planned:   Additional Equipment:   Intra-op Plan:   Post-operative Plan:   Informed Consent: I have reviewed the patients History and Physical, chart, labs and discussed the procedure including the risks, benefits and alternatives for the proposed anesthesia with the patient or authorized representative who has indicated his/her understanding and acceptance.     Dental Advisory Given  Plan Discussed with: CRNA  Anesthesia Plan Comments:         Anesthesia Quick Evaluation

## 2021-03-01 NOTE — Interval H&P Note (Signed)
History and Physical Interval Note:  03/01/2021 7:19 AM  Henry Nguyen  has presented today for surgery, with the diagnosis of torn medial meniscus.  The various methods of treatment have been discussed with the patient and family. After consideration of risks, benefits and other options for treatment, the patient has consented to  Procedure(s): KNEE ARTHROSCOPY WITH MEDIAL MENISECTOMY (Right) as a surgical intervention.  The patient's history has been reviewed, patient examined, no change in status, stable for surgery.  I have reviewed the patient's chart and labs.  Questions were answered to the patient's satisfaction.     Arther Abbott

## 2021-03-01 NOTE — Brief Op Note (Signed)
03/01/2021  8:33 AM  PATIENT:  Henry Nguyen  60 y.o. male  PRE-OPERATIVE DIAGNOSIS:  torn medial meniscus  POST-OPERATIVE DIAGNOSIS:  torn medial meniscus  PROCEDURE:  Procedure(s): KNEE ARTHROSCOPY WITH MEDIAL MENISECTOMY (Right)  SURGEON:  Surgeon(s) and Role:    Carole Civil, MD - Primary  PHYSICIAN ASSISTANT:   ASSISTANTS: none   ANESTHESIA:   general  EBL:  none   BLOOD ADMINISTERED:none  DRAINS: none   LOCAL MEDICATIONS USED:  MARCAINE     SPECIMEN:  No Specimen  DISPOSITION OF SPECIMEN:  N/A  COUNTS:  YES  TOURNIQUET:  * No tourniquets in log *  DICTATION: .Dragon Dictation  PLAN OF CARE: Discharge to home after PACU  PATIENT DISPOSITION:  PACU - hemodynamically stable.   Delay start of Pharmacological VTE agent (>24hrs) due to surgical blood loss or risk of bleeding: no

## 2021-03-01 NOTE — Anesthesia Postprocedure Evaluation (Signed)
Anesthesia Post Note  Patient: Clinical research associate  Procedure(s) Performed: KNEE ARTHROSCOPY WITH MEDIAL MENISECTOMY (Right: Knee)  Patient location during evaluation: Phase II Anesthesia Type: General Level of consciousness: awake Pain management: pain level controlled Vital Signs Assessment: post-procedure vital signs reviewed and stable Respiratory status: spontaneous breathing and respiratory function stable Cardiovascular status: blood pressure returned to baseline and stable Postop Assessment: no headache and no apparent nausea or vomiting Anesthetic complications: no Comments: Late entry   No notable events documented.   Last Vitals:  Vitals:   03/01/21 0915 03/01/21 0926  BP: 110/61 132/69  Pulse: (!) 59 (!) 58  Resp: 20 18  Temp:    SpO2: 98% 97%    Last Pain:  Vitals:   03/01/21 0926  TempSrc:   PainSc: 0-No pain                 Louann Sjogren

## 2021-03-01 NOTE — Transfer of Care (Signed)
Immediate Anesthesia Transfer of Care Note  Patient: Henry Nguyen  Procedure(s) Performed: KNEE ARTHROSCOPY WITH MEDIAL MENISECTOMY (Right: Knee)  Patient Location: PACU  Anesthesia Type:General  Level of Consciousness: drowsy  Airway & Oxygen Therapy: Patient Spontanous Breathing and Patient connected to face mask oxygen  Post-op Assessment: Report given to RN and Post -op Vital signs reviewed and stable  Post vital signs: Reviewed and stable  Last Vitals:  Vitals Value Taken Time  BP    Temp    Pulse 59 03/01/21 0838  Resp 15 03/01/21 0838  SpO2 100 % 03/01/21 0838  Vitals shown include unvalidated device data.  Last Pain:  Vitals:   03/01/21 0641  TempSrc: Oral  PainSc: 0-No pain      Patients Stated Pain Goal: 7 ([id number])  Complications: No notable events documented.

## 2021-03-01 NOTE — Op Note (Signed)
03/01/2021  8:34 AM  Knee arthroscopy dictation  Preop diagnosis medial meniscus tear right knee  Postop diagnosis medial meniscus tear right knee osteoarthritis right knee  Procedure arthroscopy arthroscopy right knee partial medial meniscectomy  Surgeon Aline Brochure  Operative findings  MEDIAL torn medial meniscus complex tear which appeared to initially start out as a radial tear and then became complex it involved the posterior horn and junction of the body and posterior horn and part of the body as well mild chondromalacia medial femoral condyle  LATERAL normal  PTF mild lateral chondral fissure  NOTCH normal ACL PCL     The patient was identified in the preoperative holding area using 2 approved identification mechanisms. The chart was reviewed and updated. The surgical site was confirmed as right knee and marked with an indelible marker.  The patient was taken to the operating room for anesthesia. After successful general anesthesia, Ancef 2 g was used as IV antibiotics.  The patient was placed in the supine position with the (right lower extremity) the operative extremity in an arthroscopic leg holder and the opposite extremity in a padded leg holder.  The timeout was executed.  A lateral portal was established with an 11 blade and the scope was introduced into the joint. A diagnostic arthroscopy was performed in circumferential manner examining the entire knee joint. A medial portal was established and the diagnostic arthroscopy was repeated using a probe to palpate intra-articular structures as they were encountered.    The medial meniscus was resected using a duckbill forceps. The meniscal fragments were removed with a motorized shaver. The meniscus was balanced with a combination of a motorized shaver and a 50 ArthroCare wand until a stable rim was obtained.   The arthroscopic pump was placed on the wash mode and any excess debris was removed from the joint using  suction.  60 cc of Marcaine with epinephrine was injected through the arthroscope.  The portals were closed with 3-0 nylon suture.  A sterile bandage, Ace wrap and Cryo/Cuff was placed and the Cryo/Cuff was activated. The patient was taken to the recovery room in stable condition.  PHYSICIAN ASSISTANT: no  ASSISTANTS: none   ANESTHESIA:   General  EBL:  none   BLOOD ADMINISTERED:none  DRAINS: none   LOCAL MEDICATIONS USED:  MARCAINE     SPECIMEN:  No Specimen  DISPOSITION OF SPECIMEN:  N/A  COUNTS:  YES   DICTATION: .Dragon Dictation  PLAN OF CARE: Discharge to home after PACU  PATIENT DISPOSITION:  PACU - hemodynamically stable.   Delay start of Pharmacological VTE agent (>24hrs) due to surgical blood loss or risk of bleeding: not applicable

## 2021-03-01 NOTE — H&P (Signed)
Henry Nguyen is an 60 y.o. male.    Chief Complaint: Pain right knee   HPI: This is a 60 year old male who presents for arthroscopic surgery of the right knee.  He initially was treated by Dr. Luna Glasgow for painful right knee after failing nonoperative treatment he had an MRI which showed a subchondral fracture and a torn medial meniscus  He was then treated with bracing and protected weightbearing to allow the medial insufficiency fracture to heal and now presents for arthroscopic surgery to address the painful meniscus    Past Medical History:  Diagnosis Date   Arthritis    Diabetes mellitus without complication ([place])    GERD (gastroesophageal reflux disease)    Hypertension     Past Surgical History:  Procedure Laterality Date   COLONOSCOPY  2010   COLONOSCOPY N/A 07/09/2018   Procedure: COLONOSCOPY;  Surgeon: Danie Binder, MD;  Location: [facility];  Service: Endoscopy;  Laterality: N/A;  11:15   ESOPHAGOGASTRODUODENOSCOPY  2010   POLYPECTOMY  07/09/2018   Procedure: POLYPECTOMY;  Surgeon: Danie Binder, MD;  Location: [facility];  Service: Endoscopy;;    Family History  Problem Relation Age of Onset   Heart attack Mother    CAD Mother    Diabetes Sister    Hypertension Sister    Colon cancer Neg Hx    Colon polyps Neg Hx    Social History:  reports that he has never smoked. He has never used smokeless tobacco. He reports that he does not currently use drugs. He reports that he does not drink alcohol.  Allergies: No Known Allergies  Medications Prior to Admission  Medication Sig Dispense Refill   albuterol (VENTOLIN HFA) 108 (90 Base) MCG/ACT inhaler Inhale 1-2 puffs into the lungs every 6 (six) hours as needed for wheezing or shortness of breath.     amLODipine (NORVASC) 10 MG tablet Take 10 mg by mouth daily.     aspirin EC 81 MG tablet Take 81 mg by mouth daily.     BACK & BODY EXTRA STRENGTH 500-32.5 MG TABS Take 1 tablet by mouth 2 (two) times daily  as needed (pain.).     cloNIDine (CATAPRES) 0.1 MG tablet Take 0.1 mg by mouth 2 (two) times daily.     glipiZIDE (GLUCOTROL) 10 MG tablet Take 10 mg by mouth daily before breakfast.     hydrochlorothiazide (HYDRODIURIL) 25 MG tablet Take 25 mg by mouth every morning.  6   Insulin Glargine (BASAGLAR KWIKPEN) 100 UNIT/ML SOPN Inject 46 Units into the skin at bedtime.  5   lisinopril (PRINIVIL,ZESTRIL) 20 MG tablet Take 1 tablet (20 mg total) by mouth daily. 30 tablet 3   metFORMIN (GLUCOPHAGE) 500 MG tablet Take 500 mg by mouth daily with breakfast.   5   naproxen sodium (ALEVE) 220 MG tablet Take 220 mg by mouth 2 (two) times daily as needed (pain.).     omeprazole (PRILOSEC) 20 MG capsule Take 20 mg by mouth daily before breakfast.   5    Results for orders placed or performed during the hospital encounter of 03/01/21 (from the past 48 hour(s))  Glucose, capillary     Status: Abnormal   Collection Time: 03/01/21  6:45 AM  Result Value Ref Range   Glucose-Capillary 127 (H) 70 - 99 mg/dL    Comment: Glucose reference range applies only to samples taken after fasting for at least 8 hours.   No results found.  Review of Systems  Blood pressure 126/71, temperature 97.7 F (36.5 C), temperature source Oral, resp. rate 19, SpO2 99 %. Physical Exam   Mental status psychiatric he is awake alert and oriented x3 his mood and affect are normal  Cardiovascular he has good pulse and perfusion to his lower extremities without any peripheral edema  General appearance endomorphic body habitus  Skin surgical site shows normal skin  Lymph nodes are negative cervical lower extremity  Right knee is tender over the medial joint line no effusion Range of motion is normal Instability none Motor exam normal     Assessment/Plan  Medial insufficiency fracture medial femoral condyle Horizontal tear medial meniscus Osteoarthritis Baker's cyst  MRI KNEE IMPRESSION: 1. Subchondral  insufficiency fracture of the medial femoral condyle. 2. Horizontal tear of the medial meniscus posterior body and horn. 3. Mild medial compartment osteoarthritis. 4. Small joint effusion and Baker cyst.     Electronically Signed   By: Titus Dubin M.D.   On: 11/01/2020 10:09  Arthroscopy right knee partial medial meniscectomy   Arther Abbott, MD 03/01/2021, 7:04 AM

## 2021-03-04 ENCOUNTER — Encounter (HOSPITAL_COMMUNITY): Payer: Self-pay | Admitting: Orthopedic Surgery

## 2021-03-04 ENCOUNTER — Telehealth: Payer: Self-pay | Admitting: Orthopedic Surgery

## 2021-03-04 NOTE — Telephone Encounter (Signed)
Patient question is - can his post op appointment, which is scheduled for this Wednesday, 03/06/21, wait until next Wednesday, 03/13/21, due to car problem. Please advise.

## 2021-03-05 NOTE — Telephone Encounter (Signed)
On 03/04/21, called back to patient; rescheduled accordingly. Aware of appointment.

## 2021-03-06 ENCOUNTER — Encounter: Payer: Medicaid Other | Admitting: Orthopedic Surgery

## 2021-03-12 DIAGNOSIS — Z9889 Other specified postprocedural states: Secondary | ICD-10-CM | POA: Insufficient documentation

## 2021-03-13 ENCOUNTER — Ambulatory Visit (INDEPENDENT_AMBULATORY_CARE_PROVIDER_SITE_OTHER): Payer: Medicaid Other | Admitting: Orthopedic Surgery

## 2021-03-13 ENCOUNTER — Other Ambulatory Visit: Payer: Self-pay

## 2021-03-13 ENCOUNTER — Encounter: Payer: Self-pay | Admitting: Orthopedic Surgery

## 2021-03-13 DIAGNOSIS — Z794 Long term (current) use of insulin: Secondary | ICD-10-CM | POA: Insufficient documentation

## 2021-03-13 DIAGNOSIS — M1710 Unilateral primary osteoarthritis, unspecified knee: Secondary | ICD-10-CM | POA: Insufficient documentation

## 2021-03-13 DIAGNOSIS — L739 Follicular disorder, unspecified: Secondary | ICD-10-CM | POA: Insufficient documentation

## 2021-03-13 DIAGNOSIS — M179 Osteoarthritis of knee, unspecified: Secondary | ICD-10-CM | POA: Insufficient documentation

## 2021-03-13 DIAGNOSIS — Z9889 Other specified postprocedural states: Secondary | ICD-10-CM

## 2021-03-13 DIAGNOSIS — E559 Vitamin D deficiency, unspecified: Secondary | ICD-10-CM | POA: Insufficient documentation

## 2021-03-13 DIAGNOSIS — E782 Mixed hyperlipidemia: Secondary | ICD-10-CM | POA: Insufficient documentation

## 2021-03-13 DIAGNOSIS — M25569 Pain in unspecified knee: Secondary | ICD-10-CM | POA: Insufficient documentation

## 2021-03-13 DIAGNOSIS — E119 Type 2 diabetes mellitus without complications: Secondary | ICD-10-CM | POA: Insufficient documentation

## 2021-03-13 MED ORDER — HYDROCODONE-ACETAMINOPHEN 7.5-325 MG PO TABS
1.0000 | ORAL_TABLET | ORAL | 0 refills | Status: AC | PRN
Start: 2021-03-13 — End: 2021-03-18

## 2021-03-13 NOTE — Progress Notes (Signed)
Chief Complaint  Patient presents with   Post-op Follow-up    SARK 03/01/21 improving sutures harvested today     Encounter Diagnosis  Name Primary?   S/P right knee arthroscopy 03/01/21 Yes    Knee arthroscopy dictation   Preop diagnosis medial meniscus tear right knee  Postop diagnosis medial meniscus tear right knee osteoarthritis right knee  Procedure arthroscopy arthroscopy right knee partial medial meniscectomy   Surgeon Aline Brochure   Operative findings   MEDIAL torn medial meniscus complex tear which appeared to initially start out as a radial tear and then became complex it involved the posterior horn and junction of the body and posterior horn and part of the body as well mild chondromalacia medial femoral condyle  Postop day 12 status post right knee arthroscopy patient says he still having some pain 5 out of 10 during the day 8 out of 10 at night.  He says he flushed some of his medicine down the toilet by accident  His sutures were removed his knee looks good he has minimal swelling his range of motion is good except for the last few degrees of extension  Recommend he continue ice naproxen hydrocodone 7.5 mg follow-up in 4 weeks start home exercises  Meds ordered this encounter  Medications   HYDROcodone-acetaminophen (NORCO) 7.5-325 MG tablet    Sig: Take 1 tablet by mouth every 4 (four) hours as needed for up to 5 days for moderate pain.    Dispense:  30 tablet    Refill:  0

## 2021-03-13 NOTE — Patient Instructions (Signed)
Apply ice to the knee 4 times a day for 30 minutes  Do your exercises once a day  Wear the knee brace for the next 4 weeks  Take Aleve twice a day  Take the pain medicine as prescribed

## 2021-04-10 ENCOUNTER — Ambulatory Visit (INDEPENDENT_AMBULATORY_CARE_PROVIDER_SITE_OTHER): Payer: Medicaid Other | Admitting: Orthopedic Surgery

## 2021-04-10 ENCOUNTER — Encounter: Payer: Medicaid Other | Admitting: Orthopedic Surgery

## 2021-04-10 ENCOUNTER — Ambulatory Visit: Payer: Medicaid Other

## 2021-04-10 ENCOUNTER — Other Ambulatory Visit: Payer: Self-pay

## 2021-04-10 DIAGNOSIS — M25572 Pain in left ankle and joints of left foot: Secondary | ICD-10-CM

## 2021-04-10 DIAGNOSIS — W010XXA Fall on same level from slipping, tripping and stumbling without subsequent striking against object, initial encounter: Secondary | ICD-10-CM | POA: Diagnosis not present

## 2021-04-10 MED ORDER — HYDROCODONE-ACETAMINOPHEN 5-325 MG PO TABS
1.0000 | ORAL_TABLET | Freq: Four times a day (QID) | ORAL | 0 refills | Status: AC | PRN
Start: 2021-04-10 — End: 2021-04-15

## 2021-04-10 NOTE — Progress Notes (Signed)
Chief Complaint  Patient presents with   Follow-up    Recheck on right knee, DOS 03-01-21.    Encounter Diagnosis  Name Primary?   Acute left ankle pain Yes    Procedure arthroscopy arthroscopy right knee partial medial meniscectomy   Surgeon Aline Brochure   Operative findings   MEDIAL torn medial meniscus complex tear which appeared to initially start out as a radial tear and then became complex it involved the posterior horn and junction of the body and posterior horn and part of the body as well mild chondromalacia medial femoral condyle  60 year old male doing well status post arthroscopy and medial meniscectomy  He was progressing very well in terms of his right knee and still is but he slipped and injured his left ankle on Monday 17 October  X-rays today were negative  Clinical exam showed tenderness over the anterior talofibular ligament but is stable drawer excellent range of motion neurovascular exam intact  Recommend ASO brace  I should see him in a couple of weeks.  Meds ordered this encounter  Medications   HYDROcodone-acetaminophen (NORCO/VICODIN) 5-325 MG tablet    Sig: Take 1 tablet by mouth every 6 (six) hours as needed for up to 5 days for moderate pain.    Dispense:  20 tablet    Refill:  0

## 2021-08-08 ENCOUNTER — Ambulatory Visit: Payer: Medicaid Other | Admitting: Orthopedic Surgery

## 2021-08-12 ENCOUNTER — Encounter: Payer: Self-pay | Admitting: *Deleted

## 2021-08-19 ENCOUNTER — Ambulatory Visit: Payer: Medicaid Other | Admitting: Orthopedic Surgery

## 2021-08-19 ENCOUNTER — Ambulatory Visit: Payer: Medicaid Other

## 2021-08-19 ENCOUNTER — Encounter: Payer: Self-pay | Admitting: Orthopedic Surgery

## 2021-08-19 ENCOUNTER — Other Ambulatory Visit: Payer: Self-pay

## 2021-08-19 VITALS — BP 101/64 | HR 61 | Ht 69.0 in | Wt 213.4 lb

## 2021-08-19 DIAGNOSIS — M25562 Pain in left knee: Secondary | ICD-10-CM | POA: Diagnosis not present

## 2021-08-19 DIAGNOSIS — S83242A Other tear of medial meniscus, current injury, left knee, initial encounter: Secondary | ICD-10-CM | POA: Diagnosis not present

## 2021-08-19 DIAGNOSIS — G8929 Other chronic pain: Secondary | ICD-10-CM

## 2021-08-19 DIAGNOSIS — Z9889 Other specified postprocedural states: Secondary | ICD-10-CM | POA: Diagnosis not present

## 2021-08-19 MED ORDER — TRAMADOL-ACETAMINOPHEN 37.5-325 MG PO TABS: 1.0000 | ORAL_TABLET | Freq: Four times a day (QID) | ORAL | 0 refills | Status: DC | PRN

## 2021-08-19 NOTE — Patient Instructions (Addendum)
°  You have received an injection of steroids into the joint. 15% of patients will have increased pain within the 24 hours postinjection.   This is transient and will go away.   We recommend that you use ice packs on the injection site for 20 minutes every 2 hours and extra strength Tylenol 2 tablets every 8 as needed until the pain resolves.  If you continue to have pain after taking the Tylenol and using the ice please call the office for further instructions.  While we are working on your approval for MRI please go ahead and call to schedule your appointment with [facility] within at least one (1) week.   Central Scheduling [phone]

## 2021-08-19 NOTE — Progress Notes (Signed)
Chief Complaint  Patient presents with   Knee Pain    LT// has been hurting for a while but is getting worse   24-month history of pain [place] left knee worsening over the last 2 weeks  He has diabetes and hypertension  He complains of pain and stiffness catching and locking  Past Medical History:  Diagnosis Date   Arthritis    Diabetes mellitus without complication ([facility])    GERD (gastroesophageal reflux disease)    Hypertension     BP 101/64    Pulse 61    Ht 5\' 9"  (1.753 m)    Wt 213 lb 6.4 oz (96.8 kg)    BMI 31.51 kg/m   General appearance normal development grooming and hygiene  Awake alert and oriented x3  Mood and affect normal  Gait antalgic left leg with cane  Small effusion left knee medial tenderness cannot fully extend flexes 125 knee ligaments stable muscle tone normal  X-rays show symmetric joint space narrowing small effusion  Encounter Diagnoses  Name Primary?   S/P right knee arthroscopy 03/01/21    Chronic pain of left knee    Acute medial meniscus tear of left knee, initial encounter Yes    Meds ordered this encounter  Medications   traMADol-acetaminophen (ULTRACET) 37.5-325 MG tablet    Sig: Take 1 tablet by mouth every 6 (six) hours as needed for up to 5 days.    Dispense:  20 tablet    Refill:  0    Procedure note left knee injection   verbal consent was obtained to inject left knee joint  Timeout was completed to confirm the site of injection  The medications used were depomedrol 40 mg and 1% lidocaine 3 cc Anesthesia was provided by ethyl chloride and the skin was prepped with alcohol.  After cleaning the skin with alcohol a 20-gauge needle was used to inject the left knee joint. There were no complications. A sterile bandage was applied.

## 2021-09-03 ENCOUNTER — Other Ambulatory Visit: Payer: Self-pay

## 2021-09-03 ENCOUNTER — Ambulatory Visit (HOSPITAL_COMMUNITY)
Admission: RE | Admit: 2021-09-03 | Discharge: 2021-09-03 | Disposition: A | Discharge status: Home / Self Care | Payer: Medicaid Other | Source: Ambulatory Visit | Attending: Orthopedic Surgery | Admitting: Orthopedic Surgery

## 2021-09-03 DIAGNOSIS — G8929 Other chronic pain: Secondary | ICD-10-CM | POA: Insufficient documentation

## 2021-09-03 DIAGNOSIS — M25562 Pain in left knee: Secondary | ICD-10-CM | POA: Diagnosis not present

## 2021-09-03 DIAGNOSIS — S83242A Other tear of medial meniscus, current injury, left knee, initial encounter: Secondary | ICD-10-CM | POA: Insufficient documentation

## 2021-09-03 IMAGING — MR MR KNEE*L* W/O CM
7 series · 40 of 40 positions shown · non-contrast
Comparison: Radiograph [DATE]

CLINICAL DATA: Meniscal injury, knee

EXAM:
MRI OF THE LEFT KNEE WITHOUT CONTRAST
TECHNIQUE: Multiplanar, multisequence MR imaging of the knee was performed. No
intravenous contrast was administered.

[Series 8: T2 fat-sat · axial · left · 4.0mm · 0.47mm/px · z∈[-82,+43]mm · 6 of 26 slices shown (1 of 3)]
[im 1/26]
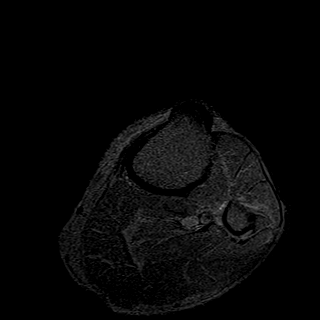
[im 6/26]
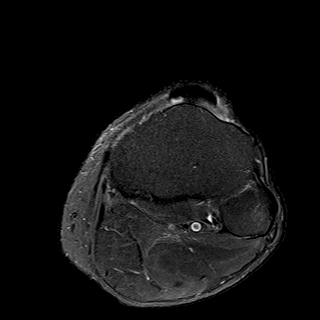
[im 11/26]
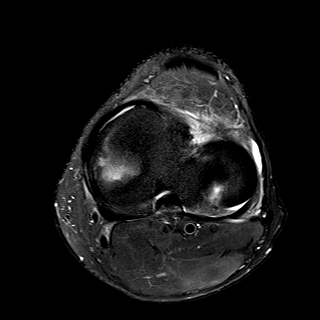
[im 16/26]
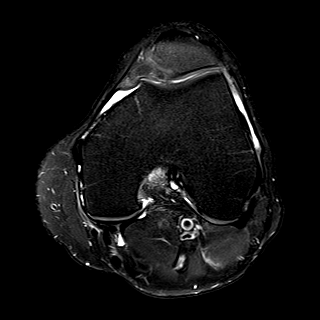
[im 21/26]
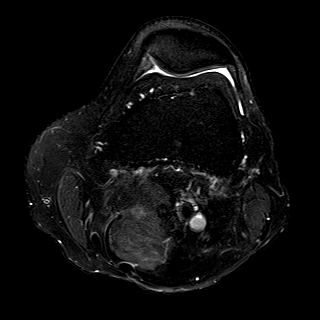
[im 26/26]
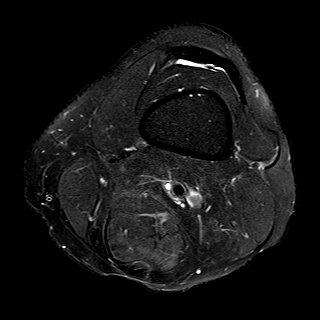

[Series 9: T1 · coronal · left · 4.0mm · 0.59mm/px · 6 of 25 slices shown]
[im 1/25]
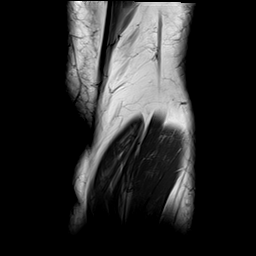
[im 5/25]
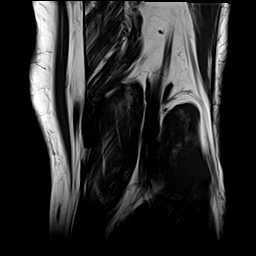
[im 10/25]
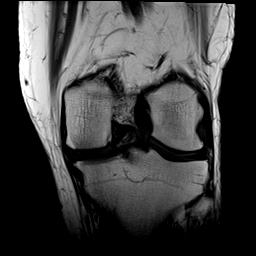
[im 15/25]
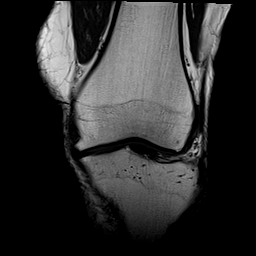
[im 20/25]
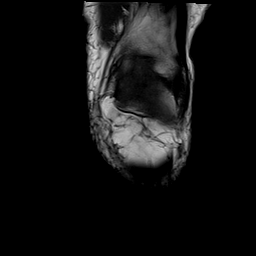
[im 25/25]
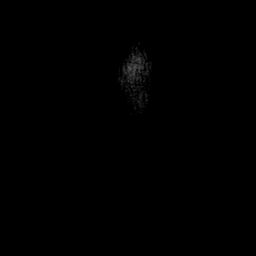

[Series 10: T2 fat-sat · coronal · left · 4.0mm · 0.59mm/px · 6 of 28 slices shown (2 of 3)]
[im 1/28]
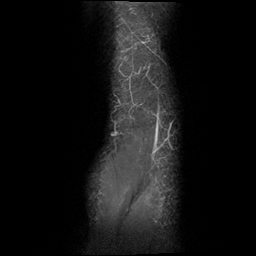
[im 6/28]
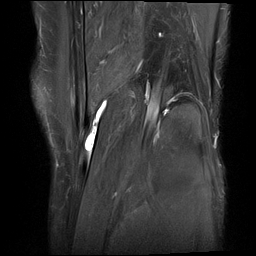
[im 11/28]
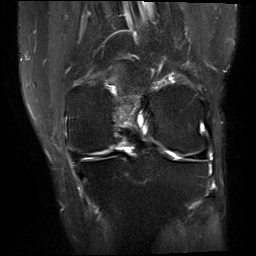
[im 17/28]
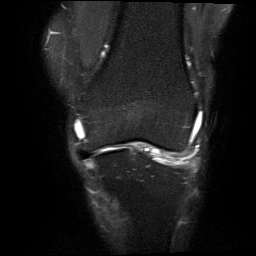
[im 22/28]
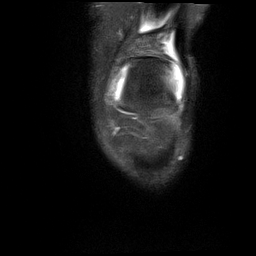
[im 28/28]
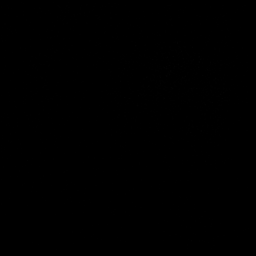

[Series 11: PD fat-sat · coronal · left · 4.0mm · 0.59mm/px · 6 of 28 slices shown (1 of 2)]
[im 1/28]
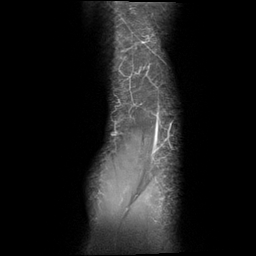
[im 6/28]
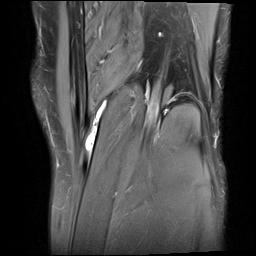
[im 11/28]
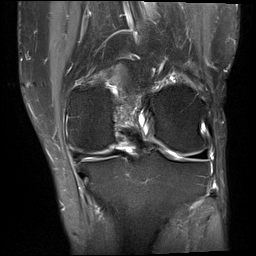
[im 17/28]
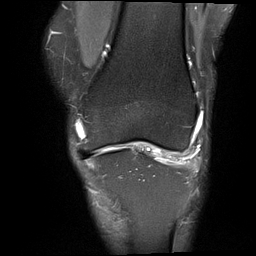
[im 22/28]
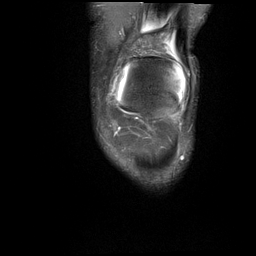
[im 28/28]
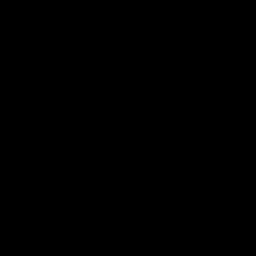

[Series 12: PD fat-sat · sagittal · left · 3.0mm · 0.52mm/px · 6 of 29 slices shown (2 of 2)]
[im 1/29]
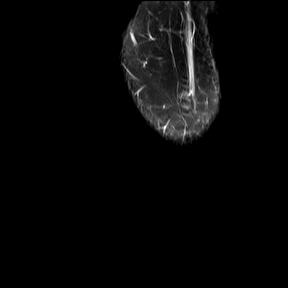
[im 6/29]
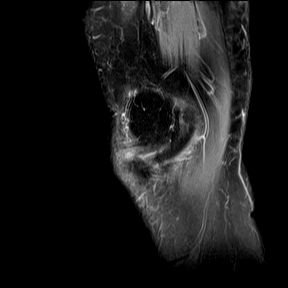
[im 12/29]
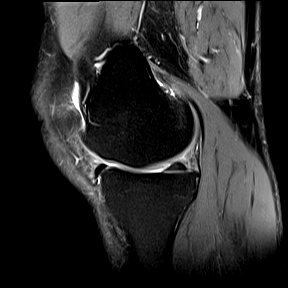
[im 17/29]
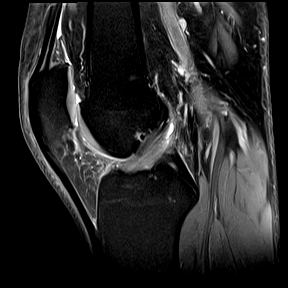
[im 23/29]
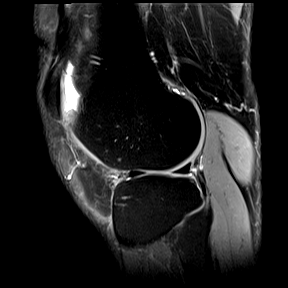
[im 29/29]
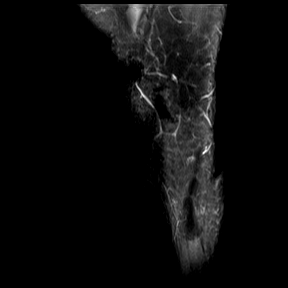

[Series 13: T2 fat-sat · sagittal · left · 3.0mm · 0.59mm/px · 7 of 30 slices shown (3 of 3)]
[im 1/30]
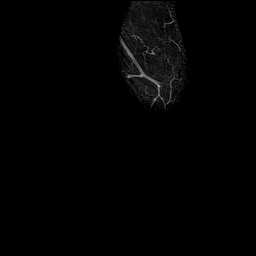
[im 5/30]
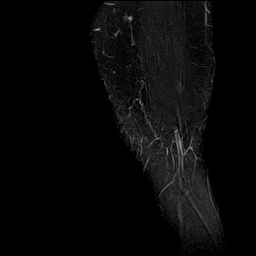
[im 10/30]
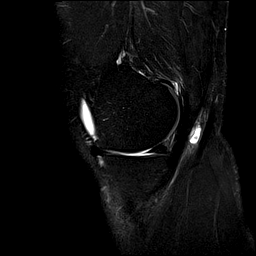
[im 15/30]
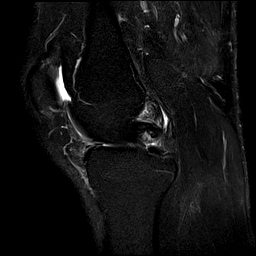
[im 20/30]
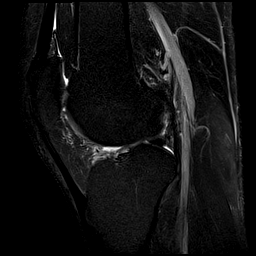
[im 25/30]
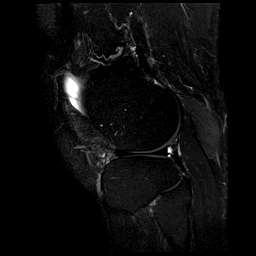
[im 30/30]
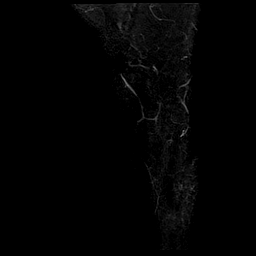

[Series 14: PD · coronal · left · 2.0mm · 0.47mm/px · 3 of 14 slices shown]
[im 1/14]
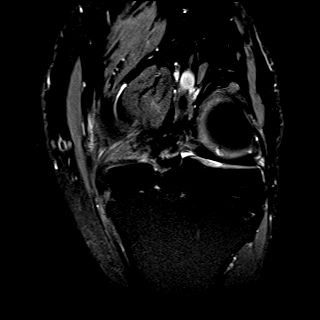
[im 7/14]
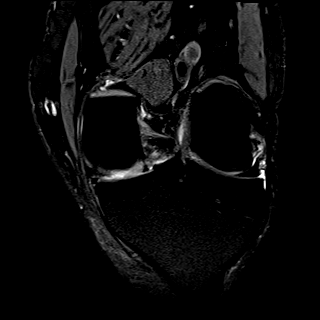
[im 14/14]
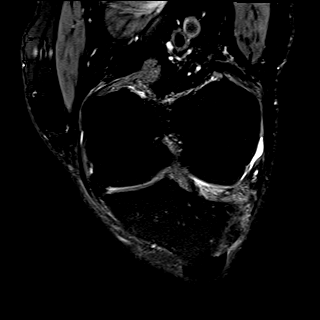

[40 of 40 positions shown; findings below may reference images not displayed]

FINDINGS: MENISCI

Medial: There is nondisplaced degenerative tearing of the posterior
horn and body of the medial meniscus.

Lateral: Intact.

LIGAMENTS

Cruciates: The ACL and PCL are intact. There is ganglion cyst
formation at the proximal ACL.

Collaterals: Medial collateral ligament is intact. Lateral
collateral ligament complex is intact.

CARTILAGE

Patellofemoral:  Mild chondrosis.  No chondral defect.

Medial: Intermediate grade partial-thickness cartilage loss along
the weight-bearing surfaces.

Lateral: Mild chondrosis with focal high-grade fissuring along the
weight-bearing lateral femoral condyle and tibial plateau and
associated focal marrow edema.

JOINT: Small joint effusion.

POPLITEAL FOSSA: Small Baker cyst.

EXTENSOR MECHANISM: Intact quadriceps tendon. Intact patellar
tendon.

BONES: Tricompartment osteophyte formation. No acute fracture or
dislocation. No aggressive osseous lesion.

Other: Ovoid T2 hyperintense along the surface of the lateral head
gastrocnemius proximally with tail tracking towards the joint. There
appears to be some intralesional fat on coronal T1. This measures
1.4 x 0.9 x 1.2 cm (axial T2 fat-sat image 10, sagittal T2 image
22).
IMPRESSION: Nondisplaced degenerative tearing of the posterior horn and body of
the medial meniscus.

Tricompartment osteoarthritis, worst in the medial and lateral
compartments, cartilaginous abnormalities as described above.

Small joint effusion.  Small Baker cyst.

Ovoid T2 hyperintense lesion along the surface of the lateral head
gastrocnemius proximally. This measures 1.4 x 0.9 x 1.2 cm. This is
most likely a small soft tissue hemangioma.

## 2021-09-04 ENCOUNTER — Telehealth: Payer: Self-pay | Admitting: Orthopedic Surgery

## 2021-09-04 NOTE — Telephone Encounter (Signed)
Left message regarding his MRI results he has a torn meniscus and will need surgery if the knee is still hurting I ? ? left a message that I would call him back tomorrow ?

## 2021-09-06 ENCOUNTER — Telehealth: Payer: Self-pay | Admitting: Orthopedic Surgery

## 2021-09-06 ENCOUNTER — Other Ambulatory Visit: Payer: Self-pay | Admitting: Orthopedic Surgery

## 2021-09-06 DIAGNOSIS — G8929 Other chronic pain: Secondary | ICD-10-CM

## 2021-09-06 DIAGNOSIS — S83242A Other tear of medial meniscus, current injury, left knee, initial encounter: Secondary | ICD-10-CM

## 2021-09-06 MED ORDER — TRAMADOL-ACETAMINOPHEN 37.5-325 MG PO TABS: 1.0000 | ORAL_TABLET | Freq: Four times a day (QID) | ORAL | 0 refills | Status: AC | PRN

## 2021-09-06 NOTE — Telephone Encounter (Signed)
Patient requesting a refill on tramadol-acetaminophen, however, not on his active medication list.  ?

## 2021-09-06 NOTE — Progress Notes (Signed)
Meds ordered this encounter  ?Medications  ? traMADol-acetaminophen (ULTRACET) 37.5-325 MG tablet  ?  Sig: Take 1 tablet by mouth every 6 (six) hours as needed for up to 5 days.  ?  Dispense:  20 tablet  ?  Refill:  0  ?  ?

## 2021-09-06 NOTE — Telephone Encounter (Signed)
Patient called requesting refill for his pain medicine.  ? ?traMADol-acetaminophen  ? ?Pharmacy: [facility] in [place]  ?

## 2021-09-09 ENCOUNTER — Ambulatory Visit: Payer: Medicaid Other | Admitting: Orthopedic Surgery

## 2021-09-10 ENCOUNTER — Telehealth: Payer: Self-pay | Admitting: Orthopedic Surgery

## 2021-09-10 NOTE — Telephone Encounter (Signed)
Patient states done. ?

## 2021-09-10 NOTE — Telephone Encounter (Signed)
See below and advise. Thanks ?

## 2021-09-10 NOTE — Telephone Encounter (Signed)
Patient came by office following up per call by Dr Aline Brochure from 09/09/21, regarding scheduling surgery; said received results by phone per Dr Aline Brochure. States cannot do dates 10/09/21 or 10/17/21 for surgery. Aware to await Dr Ruthe Mannan call per Dr's note. ?

## 2021-09-11 ENCOUNTER — Telehealth: Payer: Self-pay

## 2021-09-11 ENCOUNTER — Other Ambulatory Visit: Payer: Self-pay | Admitting: Orthopedic Surgery

## 2021-09-11 ENCOUNTER — Telehealth: Payer: Self-pay | Admitting: Orthopedic Surgery

## 2021-09-11 DIAGNOSIS — Z01818 Encounter for other preprocedural examination: Secondary | ICD-10-CM

## 2021-09-11 NOTE — Telephone Encounter (Signed)
Patient left message wanting to know what time his surgery is scheduled for on 4/28. ?I called him back and explained that the hospital will call him for a pre-op appointment  ?and that is when they will tell him when to be there on the day of surgery. Stated he understood. ?

## 2021-09-11 NOTE — Telephone Encounter (Signed)
The procedure has been fully reviewed with the patient; The risks and benefits of surgery have been discussed and explained and understood. Alternative treatment has also been reviewed, questions were encouraged and answered. The postoperative plan is also been reviewed. ? ?Arthroscopy of the knee left knee ?

## 2021-09-11 NOTE — Telephone Encounter (Signed)
Surgery April the 28th  ?

## 2021-09-11 NOTE — Telephone Encounter (Signed)
Left knee arthroscopy ?

## 2021-09-20 ENCOUNTER — Other Ambulatory Visit: Payer: Self-pay

## 2021-09-20 DIAGNOSIS — S83242A Other tear of medial meniscus, current injury, left knee, initial encounter: Secondary | ICD-10-CM

## 2021-09-20 DIAGNOSIS — G8929 Other chronic pain: Secondary | ICD-10-CM

## 2021-09-30 ENCOUNTER — Telehealth: Payer: Self-pay | Admitting: Orthopedic Surgery

## 2021-09-30 NOTE — Telephone Encounter (Signed)
Did he say anything about his pain medicine?  ?

## 2021-09-30 NOTE — Telephone Encounter (Signed)
Patient called and left voicemail wanting Dr. Aline Brochure assistant to call him he has questions about his surgery and also he wants a refill on his pain medicine.   ? ?Please call him back at [phone].   ?

## 2021-09-30 NOTE — Telephone Encounter (Signed)
Spoke with patient and verified surgery date with him.  ?

## 2021-10-14 ENCOUNTER — Ambulatory Visit: Payer: Medicaid Other

## 2021-10-15 NOTE — Patient Instructions (Signed)
? ? ? ? ? ? ? ? Henry Nguyen ? 10/15/2021  ?  ? '@PREFPERIOPPHARMACY'$ @ ? ? Your procedure is scheduled on  10/18/2021. ? ? Report to [name] at  0600  A.M. ? ? Call this number if you have problems the morning of surgery: ? [phone] ? ? Remember: ? Do not eat or drink after midnight. ? ?  Use your inhaler before you come and bring your rescue inhaler with  you. ?  ? ?  DO NOT take any medications for diabetes the morning of your procedure. ?  ? Take these medicines the morning of surgery with A SIP OF WATER  ? ?                    amlodipine, clonidine, prilosec. ?  ? ? Do not wear jewelry, make-up or nail polish. ? Do not wear lotions, powders, or perfumes, or deodorant. ? Do not shave 48 hours prior to surgery.  Men may shave face and neck. ? Do not bring valuables to the hospital. ? [facility] is not responsible for any belongings or valuables. ? ?Contacts, dentures or bridgework may not be worn into surgery.  Leave your suitcase in the car.  After surgery it may be brought to your room. ? ?For patients admitted to the hospital, discharge time will be determined by your treatment team. ? ?Patients discharged the day of surgery will not be allowed to drive home and must have someone with them for 24 hours.  ? ? ?Special instructions:   DO NOT smoke tobacco or vape for 24 hours before your procedure. ? ?Please read over the following fact sheets that you were given. ?Coughing and Deep Breathing, Surgical Site Infection Prevention, Anesthesia Post-op Instructions, and Care and Recovery After Surgery ?  ? ? ? Arthroscopic Knee Ligament Repair, Care After ?This sheet gives you information about how to care for yourself after your procedure. Your health care provider may also give you more specific instructions. If you have problems or questions, contact your health care provider. ?What can I expect after the procedure? ?After the procedure, it is common to have: ?Soreness or pain in your knee. ?Bruising and  swelling on your knee, calf, and ankle for 3-4 days. ?A small amount of fluid coming from the incisions. ?Follow these instructions at home: ?Medicines ?Take over-the-counter and prescription medicines only as told by your health care provider. ?Ask your health care provider if the medicine prescribed to you: ?Requires you to avoid driving or using machinery. ?Can cause constipation. You may need to take these actions to prevent or treat constipation: ?Drink enough fluid to keep your urine pale yellow. ?Take over-the-counter or prescription medicines. ?Eat foods that are high in fiber, such as beans, whole grains, and fresh fruits and vegetables. ?Limit foods that are high in fat and processed sugars, such as fried or sweet foods. ?If you have a brace or immobilizer: ?Wear it as told by your health care provider. Remove it only as told by your health care provider. ?Loosen it if your toes tingle, become numb, or turn cold and blue. ?Keep it clean and dry. ?Ask your health care provider when it is safe to drive. ?Bathing ?Do not take baths, swim, or use a hot tub until your health care provider approves. ?Keep your bandage (dressing) dry until your health care provider says that it can be removed. ?If the brace or immobilizer is not waterproof: ?Do not let it get  wet. ?Cover it with a watertight covering when you take a bath or shower. ?Incision care ? ?Follow instructions from your health care provider about how to take care of your incisions. Make sure you: ?Wash your hands with soap and water for at least 20 seconds before and after you change your dressing. If soap and water are not available, use hand sanitizer. ?Change your dressing as told by your health care provider. ?Leave stitches (sutures), skin glue, or adhesive strips in place. These skin closures may need to stay in place for 2 weeks or longer. If adhesive strip edges start to loosen and curl up, you may trim the loose edges. Do not remove adhesive  strips completely unless your health care provider tells you to do that. ?Check your incision areas every day for signs of infection. Check for: ?Redness. ?More swelling or pain. ?Blood or more fluid. ?Warmth. ?Pus or a bad smell. ?Managing pain, stiffness, and swelling ? ?If directed, put ice on the affected area. To do this: ?If you have a removable brace or immobilizer, remove it as told by your health care provider. ?Put ice in a plastic bag. ?Place a towel between your skin and the bag. ?Leave the ice on for 20 minutes, 2-3 times a day. ?Remove the ice if your skin turns bright red. This is very important. If you cannot feel pain, heat, or cold, you have a greater risk of damage to the area. ?Move your toes often to reduce stiffness and swelling. ?Raise (elevate) the injured area above the level of your heart while you are sitting or lying down. ?Activity ?Do not use your knee to support your body weight until your health care provider says that you can. Use crutches or other devices as told by your health care provider. ?Do physical therapy exercises as told by your health care provider. Physical therapy will help you regain movement and strength in your knee. ?Follow instructions from your health care provider about: ?When you may start motion exercises. ?When you may start riding a stationary bike and doing other low-impact activities. ?When you may start to jog and do other high-impact activities. ?Do not lift anything that is heavier than 10 lb (4.5 kg), or the limit that you are told, until your health care provider says that it is safe. ?Ask your health care provider what activities are safe for you. ?General instructions ?Do not use any products that contain nicotine or tobacco, such as cigarettes, e-cigarettes, and chewing tobacco. These can delay healing. If you need help quitting, ask your health care provider. ?Wear compression stockings as told by your health care provider. These stockings help to  prevent blood clots and reduce swelling in your legs. ?Keep all follow-up visits. This is important. ?Contact a health care provider if: ?You have any of these signs of infection: ?Redness around an incision. ?Blood or more fluid coming from an incision. ?Warmth coming from an incision. ?Pus or a bad smell coming from an incision. ?More swelling or pain in your knee. ?A fever or chills. ?You have pain that does not get better with medicine. ?Your incision opens up. ?Get help right away if: ?You have trouble breathing. ?You have chest pain. ?You have increased pain or swelling in your calf or at the back of your knee. ?You have numbness and tingling near the knee joint or in the foot, ankle, or toes. ?You notice that your foot or toes look darker than normal or are cooler than normal. ?These  symptoms may represent a serious problem that is an emergency. Do not wait to see if the symptoms will go away. Get medical help right away. Call your local emergency services (911 in the [place] Do not drive yourself to the hospital. ?Summary ?After the procedure, it is common to have knee pain with bruising and swelling on your knee, calf, and ankle. ?Icing your knee and raising your leg above the level of your heart will help control the pain and swelling. ?Do physical therapy exercises as told by your health care provider. Physical therapy will help you regain movement and strength in your knee. ?This information is not intended to replace advice given to you by your health care provider. Make sure you discuss any questions you have with your health care provider. ?Document Revised: 11/07/2019 Document Reviewed: 11/07/2019 ?Elsevier Patient Education ? [place]. ?General Anesthesia, Adult, Care After ?This sheet gives you information about how to care for yourself after your procedure. Your health care provider may also give you more specific instructions. If you have problems or questions, contact your health care  provider. ?What can I expect after the procedure? ?After the procedure, the following side effects are common: ?Pain or discomfort at the IV site. ?Nausea. ?Vomiting. ?Sore throat. ?Trouble concentrating. ?

## 2021-10-16 ENCOUNTER — Encounter (HOSPITAL_COMMUNITY)
Admission: RE | Admit: 2021-10-16 | Discharge: 2021-10-16 | Disposition: A | Discharge status: Home / Self Care | Payer: Medicaid Other | Source: Ambulatory Visit | Attending: Orthopedic Surgery | Admitting: Orthopedic Surgery

## 2021-10-16 VITALS — BP 151/82 | HR 115 | Temp 98.5°F | Resp 18 | Ht 69.0 in | Wt 225.0 lb

## 2021-10-16 DIAGNOSIS — E119 Type 2 diabetes mellitus without complications: Secondary | ICD-10-CM | POA: Diagnosis not present

## 2021-10-16 DIAGNOSIS — Z01818 Encounter for other preprocedural examination: Secondary | ICD-10-CM

## 2021-10-16 DIAGNOSIS — Z01812 Encounter for preprocedural laboratory examination: Secondary | ICD-10-CM | POA: Diagnosis present

## 2021-10-16 LAB — CBC WITH DIFFERENTIAL/PLATELET
Abs Immature Granulocytes: 0.04 10*3/uL (ref 0.00–0.07)
Basophils Absolute: 0 10*3/uL (ref 0.0–0.1)
Basophils Relative: 0 %
Eosinophils Absolute: 0 10*3/uL (ref 0.0–0.5)
Eosinophils Relative: 0 %
HCT: 44.9 % (ref 39.0–52.0)
Hemoglobin: 13.6 g/dL (ref 13.0–17.0)
Immature Granulocytes: 0 %
Lymphocytes Relative: 8 %
Lymphs Abs: 0.8 10*3/uL (ref 0.7–4.0)
MCH: 26.7 pg (ref 26.0–34.0)
MCHC: 30.3 g/dL (ref 30.0–36.0)
MCV: 88 fL (ref 80.0–100.0)
Monocytes Absolute: 0.7 10*3/uL (ref 0.1–1.0)
Monocytes Relative: 7 %
Neutro Abs: 8.9 10*3/uL — ABNORMAL HIGH (ref 1.7–7.7)
Neutrophils Relative %: 85 %
Platelets: 205 10*3/uL (ref 150–400)
RBC: 5.1 MIL/uL (ref 4.22–5.81)
RDW: 13.6 % (ref 11.5–15.5)
WBC: 10.5 10*3/uL (ref 4.0–10.5)
nRBC: 0 % (ref 0.0–0.2)

## 2021-10-16 LAB — BASIC METABOLIC PANEL
Anion gap: 9 (ref 5–15)
BUN: 26 mg/dL — ABNORMAL HIGH (ref 8–23)
CO2: 28 mmol/L (ref 22–32)
Calcium: 9.2 mg/dL (ref 8.9–10.3)
Chloride: 105 mmol/L (ref 98–111)
Creatinine, Ser: 1.29 mg/dL — ABNORMAL HIGH (ref 0.61–1.24)
GFR, Estimated: >60 mL/min (ref >60)
Glucose, Bld: 126 mg/dL — ABNORMAL HIGH (ref 70–99)
Potassium: 3.9 mmol/L (ref 3.5–5.1)
Sodium: 142 mmol/L (ref 135–145)

## 2021-10-16 LAB — HEMOGLOBIN A1C
Hgb A1c MFr Bld: 6.7 % — ABNORMAL HIGH (ref 4.8–5.6)
Mean Plasma Glucose: 145.59 mg/dL

## 2021-10-17 ENCOUNTER — Telehealth: Payer: Self-pay | Admitting: Orthopedic Surgery

## 2021-10-17 NOTE — Telephone Encounter (Signed)
Patient called left voicemail at 11:24 pm wanting to know if Dr. Aline Brochure is going to do his knee surgery tomorrow.   ? ?He is also wanting to know about giving him some pain medicine.  ? ?You can call him back at [phone]  ?

## 2021-10-17 NOTE — H&P (Signed)
Outpatient history and physical for outpatient surgery left knee ? ? ?Chief Complaint  ?Patient presents with  ? Knee Pain  ?    LT// has been hurting for a while but is getting worse  ?  ?This is a 61 year old male with a 3 to 47-monthhistory of pain in his left knee. ? ?It worsened and he was scheduled for surgery however the surgery had to be postponed ? ?He does have diabetes and hypertension he complains of pain and stiffness catching and locking.  After failing nonoperative treatment he had an MRI and it shows that he has arthritis and torn meniscus in his knee. ? ?Review of systems denies chest pain shortness of breath or fever ? ? ?Social History  ? ?Tobacco Use  ? Smoking status: Never  ? Smokeless tobacco: Never  ?Vaping Use  ? Vaping Use: Never used  ?Substance Use Topics  ? Alcohol use: Never  ?  Alcohol/week: 0.0 standard drinks  ? Drug use: Not Currently  ? ?Family History  ?Problem Relation Age of Onset  ? Heart attack Mother   ? CAD Mother   ? Diabetes Sister   ? Hypertension Sister   ? Colon cancer Neg Hx   ? Colon polyps Neg Hx   ? ?Past Medical History:  ?Diagnosis Date  ? Arthritis   ? Diabetes mellitus without complication ([place]   ? GERD (gastroesophageal reflux disease)   ? Hypertension   ?  ?Past Surgical History:  ?Procedure Laterality Date  ? COLONOSCOPY  2010  ? COLONOSCOPY N/A 07/09/2018  ? Procedure: COLONOSCOPY;  Surgeon: FDanie Binder MD;  Location: [facility];  Service: Endoscopy;  Laterality: N/A;  11:15  ? ESOPHAGOGASTRODUODENOSCOPY  2010  ? KNEE ARTHROSCOPY WITH MEDIAL MENISECTOMY Right 03/01/2021  ? Procedure: KNEE ARTHROSCOPY WITH MEDIAL MENISECTOMY;  Surgeon: HCarole Civil MD;  Location: [facility];  Service: Orthopedics;  Laterality: Right;  ? POLYPECTOMY  07/09/2018  ? Procedure: POLYPECTOMY;  Surgeon: FDanie Binder MD;  Location: [facility];  Service: Endoscopy;;  ? ? ?  ?BP 101/64   Pulse 61   Ht '5\' 9"'$  (1.753 m)   Wt 213 lb 6.4 oz (96.8 kg)   BMI 31.51 kg/m?  ?   ?General appearance normal development grooming and hygiene ?  ?Awake alert and oriented x3 ? ?Cardiovascular exam was normal ? ?Balance and coordination normal ?  ?Mood and affect normal ?  ?Gait antalgic left leg with cane ?  ?Left knee examination Small effusion left knee medial tenderness cannot fully extend flexes 125 knee ligaments stable muscle tone normal ?  ?X-rays show symmetric joint space narrowing small effusion ? ?MRI ?IMPRESSION: ?Nondisplaced degenerative tearing of the posterior horn and body of ?the medial meniscus. ?  ?Tricompartment osteoarthritis, worst in the medial and lateral ?compartments, cartilaginous abnormalities as described above. ?  ?Small joint effusion.  Small Baker cyst. ?  ?Ovoid T2 hyperintense lesion along the surface of the lateral head ?gastrocnemius proximally. This measures 1.4 x 0.9 x 1.2 cm. This is ?most likely a small soft tissue hemangioma. ?  ?  ?Electronically Signed ?  By: JMaurine SimmeringM.D. ?  On: 09/04/2021 11:19 ?  ?  ?    ?Encounter Diagnoses  ?Name Primary?  ? S/P right knee arthroscopy 03/01/21    ? Chronic pain of left knee    ? Acute medial meniscus tear of left knee, initial encounter Yes  ?  ?  ?Plan arthroscopy left  knee partial medial meniscectomy ?

## 2021-10-17 NOTE — Telephone Encounter (Signed)
Spoke with patient and let him know that he is still scheduled to have surgery tomorrow. Advised him that he would be prescribed pain medication after his surgery but not before. He verbalized understanding.  ?

## 2021-10-18 ENCOUNTER — Ambulatory Visit (HOSPITAL_BASED_OUTPATIENT_CLINIC_OR_DEPARTMENT_OTHER): Payer: Medicaid Other | Admitting: Certified Registered"

## 2021-10-18 ENCOUNTER — Encounter (HOSPITAL_COMMUNITY): Payer: Self-pay | Admitting: Orthopedic Surgery

## 2021-10-18 ENCOUNTER — Other Ambulatory Visit: Payer: Self-pay

## 2021-10-18 ENCOUNTER — Encounter (HOSPITAL_COMMUNITY)
Admission: RE | Disposition: A | Discharge status: Home / Self Care | Payer: Self-pay | Source: Home / Self Care | Attending: Orthopedic Surgery

## 2021-10-18 ENCOUNTER — Ambulatory Visit (HOSPITAL_COMMUNITY)
Admission: RE | Admit: 2021-10-18 | Discharge: 2021-10-18 | Disposition: A | Discharge status: Home / Self Care | Payer: Medicaid Other | Attending: Orthopedic Surgery | Admitting: Orthopedic Surgery

## 2021-10-18 ENCOUNTER — Ambulatory Visit (HOSPITAL_COMMUNITY): Payer: Medicaid Other | Admitting: Certified Registered"

## 2021-10-18 ENCOUNTER — Encounter: Payer: Self-pay | Admitting: Orthopedic Surgery

## 2021-10-18 DIAGNOSIS — I10 Essential (primary) hypertension: Secondary | ICD-10-CM | POA: Insufficient documentation

## 2021-10-18 DIAGNOSIS — S83242D Other tear of medial meniscus, current injury, left knee, subsequent encounter: Secondary | ICD-10-CM

## 2021-10-18 DIAGNOSIS — S83242A Other tear of medial meniscus, current injury, left knee, initial encounter: Secondary | ICD-10-CM | POA: Insufficient documentation

## 2021-10-18 DIAGNOSIS — E119 Type 2 diabetes mellitus without complications: Secondary | ICD-10-CM

## 2021-10-18 DIAGNOSIS — M1712 Unilateral primary osteoarthritis, left knee: Secondary | ICD-10-CM | POA: Insufficient documentation

## 2021-10-18 DIAGNOSIS — Z7984 Long term (current) use of oral hypoglycemic drugs: Secondary | ICD-10-CM

## 2021-10-18 DIAGNOSIS — Z794 Long term (current) use of insulin: Secondary | ICD-10-CM

## 2021-10-18 DIAGNOSIS — M94262 Chondromalacia, left knee: Secondary | ICD-10-CM | POA: Insufficient documentation

## 2021-10-18 DIAGNOSIS — X58XXXA Exposure to other specified factors, initial encounter: Secondary | ICD-10-CM | POA: Diagnosis not present

## 2021-10-18 DIAGNOSIS — G8929 Other chronic pain: Secondary | ICD-10-CM | POA: Diagnosis not present

## 2021-10-18 HISTORY — PX: KNEE ARTHROSCOPY WITH MEDIAL MENISECTOMY: SHX5651

## 2021-10-18 LAB — GLUCOSE, CAPILLARY: Glucose-Capillary: 129 mg/dL — ABNORMAL HIGH (ref 70–99)

## 2021-10-18 SURGERY — ARTHROSCOPY, KNEE, WITH MEDIAL MENISCECTOMY
Anesthesia: General | Site: Knee | Laterality: Left

## 2021-10-18 MED ORDER — LIDOCAINE HCL (PF) 2 % IJ SOLN
INTRAMUSCULAR | Status: AC
  Filled 2021-10-18: qty 5

## 2021-10-18 MED ORDER — IBUPROFEN 800 MG PO TABS
ORAL_TABLET | ORAL | Status: AC
Start: 2021-10-18 — End: ?
  Filled 2021-10-18: qty 1

## 2021-10-18 MED ORDER — LIDOCAINE HCL (CARDIAC) PF 100 MG/5ML IV SOSY
PREFILLED_SYRINGE | INTRAVENOUS | Status: DC | PRN
  Administered 2021-10-18: 60 mg via INTRAVENOUS

## 2021-10-18 MED ORDER — CHLORHEXIDINE GLUCONATE 0.12 % MT SOLN
15.0000 mL | Freq: Once | OROMUCOSAL | Status: AC
  Administered 2021-10-18: 15 mL via OROMUCOSAL

## 2021-10-18 MED ORDER — PROPOFOL 10 MG/ML IV BOLUS
INTRAVENOUS | Status: AC
  Filled 2021-10-18: qty 20

## 2021-10-18 MED ORDER — PHENYLEPHRINE HCL (PRESSORS) 10 MG/ML IV SOLN
INTRAVENOUS | Status: DC | PRN
  Administered 2021-10-18 (×5): 80 ug via INTRAVENOUS

## 2021-10-18 MED ORDER — PROPOFOL 10 MG/ML IV BOLUS
INTRAVENOUS | Status: DC | PRN
  Administered 2021-10-18: 180 mg via INTRAVENOUS

## 2021-10-18 MED ORDER — SUCCINYLCHOLINE CHLORIDE 200 MG/10ML IV SOSY
PREFILLED_SYRINGE | INTRAVENOUS | Status: AC
  Filled 2021-10-18: qty 10

## 2021-10-18 MED ORDER — SODIUM CHLORIDE 0.9 % IR SOLN
Status: DC | PRN
  Administered 2021-10-18 (×2): 3000 mL

## 2021-10-18 MED ORDER — CEFAZOLIN SODIUM-DEXTROSE 2-4 GM/100ML-% IV SOLN
2.0000 g | INTRAVENOUS | Status: AC
  Administered 2021-10-18: 2 g via INTRAVENOUS

## 2021-10-18 MED ORDER — EPHEDRINE SULFATE (PRESSORS) 50 MG/ML IJ SOLN
INTRAMUSCULAR | Status: DC | PRN
  Administered 2021-10-18: 5 mg via INTRAVENOUS
  Administered 2021-10-18 (×2): 10 mg via INTRAVENOUS

## 2021-10-18 MED ORDER — FENTANYL CITRATE (PF) 250 MCG/5ML IJ SOLN
INTRAMUSCULAR | Status: AC
  Filled 2021-10-18: qty 5

## 2021-10-18 MED ORDER — MEPERIDINE HCL 50 MG/ML IJ SOLN: 6.2500 mg | INTRAMUSCULAR | Status: DC | PRN

## 2021-10-18 MED ORDER — ORAL CARE MOUTH RINSE: 15.0000 mL | Freq: Once | OROMUCOSAL | Status: AC

## 2021-10-18 MED ORDER — OXYCODONE HCL 5 MG PO TABS
ORAL_TABLET | ORAL | Status: AC
  Filled 2021-10-18: qty 1

## 2021-10-18 MED ORDER — ACETAMINOPHEN 500 MG PO TABS
500.0000 mg | ORAL_TABLET | Freq: Once | ORAL | Status: AC
  Administered 2021-10-18: 500 mg via ORAL

## 2021-10-18 MED ORDER — BUPIVACAINE-EPINEPHRINE (PF) 0.5% -1:200000 IJ SOLN
INTRAMUSCULAR | Status: AC
  Filled 2021-10-18: qty 60

## 2021-10-18 MED ORDER — SUCCINYLCHOLINE CHLORIDE 200 MG/10ML IV SOSY
PREFILLED_SYRINGE | INTRAVENOUS | Status: DC | PRN
  Administered 2021-10-18: 140 mg via INTRAVENOUS

## 2021-10-18 MED ORDER — EPINEPHRINE PF 1 MG/ML IJ SOLN
INTRAMUSCULAR | Status: AC
  Filled 2021-10-18: qty 4

## 2021-10-18 MED ORDER — ACETAMINOPHEN 500 MG PO TABS
ORAL_TABLET | ORAL | Status: AC
  Filled 2021-10-18: qty 1

## 2021-10-18 MED ORDER — ONDANSETRON HCL 4 MG/2ML IJ SOLN: 4.0000 mg | Freq: Once | INTRAMUSCULAR | Status: DC | PRN

## 2021-10-18 MED ORDER — ONDANSETRON HCL 4 MG/2ML IJ SOLN
INTRAMUSCULAR | Status: AC
  Filled 2021-10-18: qty 2

## 2021-10-18 MED ORDER — ONDANSETRON HCL 4 MG/2ML IJ SOLN
INTRAMUSCULAR | Status: DC | PRN
  Administered 2021-10-18: 4 mg via INTRAVENOUS

## 2021-10-18 MED ORDER — FENTANYL CITRATE (PF) 250 MCG/5ML IJ SOLN
INTRAMUSCULAR | Status: DC | PRN
  Administered 2021-10-18: 100 ug via INTRAVENOUS

## 2021-10-18 MED ORDER — OXYCODONE HCL 5 MG PO TABS
5.0000 mg | ORAL_TABLET | Freq: Once | ORAL | Status: AC
  Administered 2021-10-18: 5 mg via ORAL

## 2021-10-18 MED ORDER — MIDAZOLAM HCL 2 MG/2ML IJ SOLN
INTRAMUSCULAR | Status: DC | PRN
Start: 2021-10-18 — End: 2021-10-18
  Administered 2021-10-18: 1 mg via INTRAVENOUS

## 2021-10-18 MED ORDER — IBUPROFEN 800 MG PO TABS: 800.0000 mg | ORAL_TABLET | Freq: Three times a day (TID) | ORAL | 1 refills | Status: DC | PRN

## 2021-10-18 MED ORDER — CEFAZOLIN SODIUM-DEXTROSE 2-4 GM/100ML-% IV SOLN
INTRAVENOUS | Status: AC
Start: 2021-10-18 — End: 2021-10-18
  Filled 2021-10-18: qty 100

## 2021-10-18 MED ORDER — HYDROMORPHONE HCL 1 MG/ML IJ SOLN: 0.2500 mg | INTRAMUSCULAR | Status: DC | PRN

## 2021-10-18 MED ORDER — HYDROCODONE-ACETAMINOPHEN 10-325 MG PO TABS: 1.0000 | ORAL_TABLET | ORAL | 0 refills | Status: AC | PRN

## 2021-10-18 MED ORDER — MIDAZOLAM HCL 2 MG/2ML IJ SOLN
INTRAMUSCULAR | Status: AC
  Filled 2021-10-18: qty 2

## 2021-10-18 MED ORDER — BUPIVACAINE-EPINEPHRINE (PF) 0.5% -1:200000 IJ SOLN
INTRAMUSCULAR | Status: DC | PRN
  Administered 2021-10-18: 30 mL via PERINEURAL

## 2021-10-18 MED ORDER — LACTATED RINGERS IV SOLN: INTRAVENOUS | Status: DC

## 2021-10-18 MED ORDER — IBUPROFEN 800 MG PO TABS
800.0000 mg | ORAL_TABLET | Freq: Once | ORAL | Status: AC
  Administered 2021-10-18: 800 mg via ORAL

## 2021-10-18 SURGICAL SUPPLY — 43 items
ABLATOR ASPIRATE 50D MULTI-PRT (SURGICAL WAND) ×1 IMPLANT
APL PRP STRL LF DISP 70% ISPRP (MISCELLANEOUS) ×1
BAG HAMPER (MISCELLANEOUS) ×2 IMPLANT
BLADE SHAVER TORPEDO 4X13 (MISCELLANEOUS) ×1 IMPLANT
BLADE SURG SZ11 CARB STEEL (BLADE) ×2 IMPLANT
BNDG CMPR STD VLCR NS LF 5.8X6 (GAUZE/BANDAGES/DRESSINGS) ×1
BNDG ELASTIC 6X5.8 VLCR NS LF (GAUZE/BANDAGES/DRESSINGS) ×2 IMPLANT
CHLORAPREP W/TINT 26 (MISCELLANEOUS) ×2 IMPLANT
CLOTH BEACON ORANGE TIMEOUT ST (SAFETY) ×2 IMPLANT
COOLER ICEMAN CLASSIC (MISCELLANEOUS) ×2 IMPLANT
DECANTER SPIKE VIAL GLASS SM (MISCELLANEOUS) ×3 IMPLANT
DRAPE HALF SHEET 40X57 (DRAPES) ×2 IMPLANT
GAUZE 4X4 16PLY ~~LOC~~+RFID DBL (SPONGE) ×2 IMPLANT
GAUZE SPONGE 4X4 12PLY STRL LF (GAUZE/BANDAGES/DRESSINGS) ×1 IMPLANT
GAUZE SPONGE 4X4 16PLY XRAY LF (GAUZE/BANDAGES/DRESSINGS) ×2 IMPLANT
GAUZE XEROFORM 5X9 LF (GAUZE/BANDAGES/DRESSINGS) ×2 IMPLANT
GLOVE BIOGEL PI IND STRL 7.0 (GLOVE) ×2 IMPLANT
GLOVE BIOGEL PI INDICATOR 7.0 (GLOVE) ×2
GLOVE SS N UNI LF 8.5 STRL (GLOVE) ×2 IMPLANT
GLOVE SURG POLYISO LF SZ8 (GLOVE) ×2 IMPLANT
GOWN STRL REUS W/TWL LRG LVL3 (GOWN DISPOSABLE) ×2 IMPLANT
GOWN STRL REUS W/TWL XL LVL3 (GOWN DISPOSABLE) ×2 IMPLANT
IV NS IRRIG 3000ML ARTHROMATIC (IV SOLUTION) ×4 IMPLANT
KIT BLADEGUARD II DBL (SET/KITS/TRAYS/PACK) ×2 IMPLANT
KIT TURNOVER CYSTO (KITS) ×2 IMPLANT
MANIFOLD NEPTUNE II (INSTRUMENTS) ×2 IMPLANT
MARKER SKIN DUAL TIP RULER LAB (MISCELLANEOUS) ×2 IMPLANT
NDL HYPO 18GX1.5 BLUNT FILL (NEEDLE) ×1 IMPLANT
NDL HYPO 21X1.5 SAFETY (NEEDLE) ×1 IMPLANT
NEEDLE HYPO 18GX1.5 BLUNT FILL (NEEDLE) ×2 IMPLANT
NEEDLE HYPO 21X1.5 SAFETY (NEEDLE) ×2 IMPLANT
PACK ARTHRO LIMB DRAPE STRL (MISCELLANEOUS) ×2 IMPLANT
PAD ABD 5X9 TENDERSORB (GAUZE/BANDAGES/DRESSINGS) ×2 IMPLANT
PAD ARMBOARD 7.5X6 YLW CONV (MISCELLANEOUS) ×2 IMPLANT
PAD COLD SHLDR WRAP-ON (PAD) ×1 IMPLANT
PAD FOR LEG HOLDER (MISCELLANEOUS) ×2 IMPLANT
PADDING CAST COTTON 6X4 STRL (CAST SUPPLIES) ×2 IMPLANT
SET ARTHROSCOPY INST (INSTRUMENTS) ×2 IMPLANT
SET BASIN LINEN APH (SET/KITS/TRAYS/PACK) ×2 IMPLANT
SUT ETHILON 3 0 FSL (SUTURE) ×2 IMPLANT
SYR 10ML LL (SYRINGE) ×2 IMPLANT
SYR 30ML LL (SYRINGE) ×3 IMPLANT
TUBING IN/OUT FLOW W/MAIN PUMP (TUBING) ×2 IMPLANT

## 2021-10-18 NOTE — Op Note (Signed)
10/18/2021 ? ?8:20 AM ? ?Knee arthroscopy dictation ? ?Preop diagnosis torn medial meniscus osteoarthritis left knee ? ?Postop diagnosis torn medial meniscus osteoarthritis left knee ? ?Procedure arthroscopy left knee with partial medial meniscectomy ? ?Surgeon Aline Brochure ? ?Operative findings ? 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 ?LATERAL ?- meniscus normal ?-articular surface normal ? ?PTF  ? -articular surface fissures in the patella primarily lateral side trochlea normal ? ?[place]  ?-acl normal ?-pcl normal ? ? ? ? ?The patient was identified in the preoperative holding area using 2 approved identification mechanisms. The chart was reviewed and updated. The surgical site was confirmed as left knee and marked with an indelible marker. ? ?The patient was taken to the operating room for anesthesia. After successful general anesthesia, Ancef 2 g was used as IV antibiotics. ? ?The patient was placed in the supine position with the (left) the operative extremity in an arthroscopic leg holder and the opposite extremity in a padded leg holder. ? ?The timeout was executed. ? ?A lateral portal was established with an 11 blade and the scope was introduced into the joint. A diagnostic arthroscopy was performed in circumferential manner examining the entire knee joint. A medial portal was established and the diagnostic arthroscopy was repeated using a probe to palpate intra-articular structures as they were encountered.  ? ? ?The medial meniscus was resected using a duckbill forceps. The meniscal fragments were removed with a motorized shaver. The meniscus was balanced with a combination of a motorized shaver and a 50? ArthroCare wand until a stable rim was obtained. ? ? ?The arthroscopic pump was placed on the wash mode and any excess debris was removed from the joint using suction. ? ?60 cc of  Marcaine with epinephrine was injected through the arthroscope. ? ?The portals were closed with 3-0 nylon suture. ? ?A sterile bandage, Ace wrap and Cryo/Cuff was placed and the Cryo/Cuff was activated. The patient was taken to the recovery room in stable condition. ? ?PHYSICIAN ASSISTANT: no ? ?ASSISTANTS: none  ? ?ANESTHESIA:   General ? ?EBL:  none  ? ?BLOOD ADMINISTERED:none ? ?DRAINS: none  ? ?LOCAL MEDICATIONS USED:  MARCAINE    ? ?SPECIMEN:  No Specimen ? ?DISPOSITION OF SPECIMEN:  N/A ? ?COUNTS:  YES ? ? ?DICTATION: .Dragon Dictation ? ?PLAN OF CARE: Discharge to home after PACU ? ?PATIENT DISPOSITION:  PACU - hemodynamically stable. ?  ?Delay start of Pharmacological VTE agent (>24hrs) due to surgical blood loss or risk of bleeding: not applicable ? ?POST OP PLAN  ?WB as tolerated ?SUTURES OUT IN A WEEK  ?AROM  ?BRACE no ?

## 2021-10-18 NOTE — Interval H&P Note (Signed)
History and Physical Interval Note: ? ?10/18/2021 ?7:23 AM ? ?Henry Nguyen  has presented today for surgery, with the diagnosis of Medial meniscus tear left knee osteoarthritis left knee.  The various methods of treatment have been discussed with the patient and family. After consideration of risks, benefits and other options for treatment, the patient has consented to  Procedure(s): ?KNEE ARTHROSCOPY WITH MEDIAL MENISECTOMY (Left) as a surgical intervention.  The patient's history has been reviewed, patient examined, no change in status, stable for surgery.  I have reviewed the patient's chart and labs.  Questions were answered to the patient's satisfaction.   ? ? ?Arther Abbott ? ? ?

## 2021-10-18 NOTE — Anesthesia Postprocedure Evaluation (Signed)
Anesthesia Post Note ? ?Patient: Henry Nguyen ? ?Procedure(s) Performed: KNEE ARTHROSCOPY WITH PARTIAL MEDIAL MENISECTOMY TEAR (Left: Knee) ? ?Patient location during evaluation: Phase II ?Anesthesia Type: General ?Level of consciousness: awake and alert and oriented ?Pain management: pain level controlled ?Vital Signs Assessment: post-procedure vital signs reviewed and stable ?Respiratory status: spontaneous breathing, nonlabored ventilation and respiratory function stable ?Cardiovascular status: blood pressure returned to baseline and stable ?Postop Assessment: no apparent nausea or vomiting ?Anesthetic complications: no ? ? ?No notable events documented. ? ? ?Last Vitals:  ?Vitals:  ? 10/18/21 0845 10/18/21 0904  ?BP: (!) 159/82 (!) 153/83  ?Pulse: 71 72  ?Resp: (!) 21 16  ?Temp:  36.6 ?C  ?SpO2: 100% 98%  ?  ?Last Pain:  ?Vitals:  ? 10/18/21 0904  ?TempSrc: Oral  ?PainSc: 0-No pain  ? ? ?  ?  ?  ?  ?  ?  ? ?Elfreida Heggs C Trong Gosling ? ? ? ? ?

## 2021-10-18 NOTE — Anesthesia Procedure Notes (Signed)
Procedure Name: Intubation ?Date/Time: 10/18/2021 7:35 AM ?Performed by: Karna Dupes, CRNA ?Pre-anesthesia Checklist: Patient identified, Emergency Drugs available, Suction available and Patient being monitored ?Patient Re-evaluated:Patient Re-evaluated prior to induction ?Oxygen Delivery Method: Circle system utilized ?Preoxygenation: Pre-oxygenation with 100% oxygen ?Induction Type: IV induction ?Ventilation: Mask ventilation without difficulty ?Laryngoscope Size: Mac and 4 ?Grade View: Grade I ?Tube type: Oral ?Tube size: 7.5 mm ?Number of attempts: 1 ?Airway Equipment and Method: Stylet ?Placement Confirmation: ETT inserted through vocal cords under direct vision, positive ETCO2 and breath sounds checked- equal and bilateral ?Secured at: 23 cm ?Tube secured with: Tape ?Dental Injury: Teeth and Oropharynx as per pre-operative assessment  ? ? ? ? ?

## 2021-10-18 NOTE — Transfer of Care (Signed)
Immediate Anesthesia Transfer of Care Note ? ?Patient: Henry Nguyen ? ?Procedure(s) Performed: KNEE ARTHROSCOPY WITH PARTIAL MEDIAL MENISECTOMY TEAR (Left: Knee) ? ?Patient Location: PACU ? ?Anesthesia Type:General ? ?Level of Consciousness: awake and alert  ? ?Airway & Oxygen Therapy: Patient Spontanous Breathing and Patient connected to nasal cannula oxygen ? ?Post-op Assessment: Report given to RN and Post -op Vital signs reviewed and stable ? ?Post vital signs: Reviewed and stable ? ?Last Vitals:  ?Vitals Value Taken Time  ?BP 140/76   ?Temp 97.9   ?Pulse 76 10/18/21 0827  ?Resp 21 10/18/21 0827  ?SpO2 100 % 10/18/21 0827  ?Vitals shown include unvalidated device data. ? ?Last Pain:  ?Vitals:  ? 10/18/21 0640  ?TempSrc: Oral  ?PainSc: 3   ?   ? ?Patients Stated Pain Goal: 6 (10/18/21 0640) ? ?Complications: No notable events documented. ?

## 2021-10-18 NOTE — Anesthesia Preprocedure Evaluation (Signed)
Anesthesia Evaluation  ?Patient identified by MRN, date of birth, ID band ?Patient awake ? ? ? ?Reviewed: ?Allergy & Precautions, NPO status , Patient's Chart, lab work & pertinent test results ? ?Airway ?Mallampati: III ? ?TM Distance: >3 FB ?Neck ROM: Full ? ? ? Dental ? ?(+) Dental Advisory Given, Missing ?  ?Pulmonary ?neg pulmonary ROS,  ?  ?Pulmonary exam normal ?breath sounds clear to auscultation ? ? ? ? ? ? Cardiovascular ?hypertension, Pt. on medications ?Normal cardiovascular exam ?Rhythm:Regular Rate:Normal ? ? ?  ?Neuro/Psych ?negative neurological ROS ? negative psych ROS  ? GI/Hepatic ?Neg liver ROS, GERD  Medicated and Controlled,  ?Endo/Other  ?negative endocrine ROSdiabetes, Well Controlled, Type 2, Oral Hypoglycemic Agents, Insulin Dependent ? Renal/GU ?Renal InsufficiencyRenal disease (AKI)  ?negative genitourinary ?  ?Musculoskeletal ? ?(+) Arthritis , Osteoarthritis,   ? Abdominal ?  ?Peds ?negative pediatric ROS ?(+)  Hematology ?negative hematology ROS ?(+)   ?Anesthesia Other Findings ? ? Reproductive/Obstetrics ?negative OB ROS ? ?  ? ? ? ? ? ? ? ? ? ? ? ? ? ?  ?  ? ? ? ? ? ? ?Anesthesia Physical ?Anesthesia Plan ? ?ASA: 3 ? ?Anesthesia Plan: General  ? ?Post-op Pain Management: Dilaudid IV  ? ?Induction: Intravenous ? ?PONV Risk Score and Plan: 3 and Ondansetron and Dexamethasone ? ?Airway Management Planned: Oral ETT ? ?Additional Equipment:  ? ?Intra-op Plan:  ? ?Post-operative Plan: Extubation in OR ? ?Informed Consent: I have reviewed the patients History and Physical, chart, labs and discussed the procedure including the risks, benefits and alternatives for the proposed anesthesia with the patient or authorized representative who has indicated his/her understanding and acceptance.  ? ? ? ? ? ?Plan Discussed with: CRNA and Surgeon ? ?Anesthesia Plan Comments:   ? ? ? ? ? ?Anesthesia Quick Evaluation ? ?

## 2021-10-18 NOTE — Brief Op Note (Signed)
10/18/2021 ? ?8:20 AM ? ?Knee arthroscopy dictation ? ?Preop diagnosis torn medial meniscus osteoarthritis left knee ? ?Postop diagnosis torn medial meniscus osteoarthritis left knee ? ?Procedure arthroscopy left knee with partial medial meniscectomy ? ?Surgeon Aline Brochure ? ?Operative findings ? 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 ?LATERAL ?- meniscus normal ?-articular surface normal ? ?PTF  ? -articular surface fissures in the patella primarily lateral side trochlea normal ? ?[place]  ?-acl normal ?-pcl normal ? ? ? ? ?The patient was identified in the preoperative holding area using 2 approved identification mechanisms. The chart was reviewed and updated. The surgical site was confirmed as left knee and marked with an indelible marker. ? ?The patient was taken to the operating room for anesthesia. After successful general anesthesia, Ancef 2 g was used as IV antibiotics. ? ?The patient was placed in the supine position with the (left) the operative extremity in an arthroscopic leg holder and the opposite extremity in a padded leg holder. ? ?The timeout was executed. ? ?A lateral portal was established with an 11 blade and the scope was introduced into the joint. A diagnostic arthroscopy was performed in circumferential manner examining the entire knee joint. A medial portal was established and the diagnostic arthroscopy was repeated using a probe to palpate intra-articular structures as they were encountered.  ? ? ?The medial meniscus was resected using a duckbill forceps. The meniscal fragments were removed with a motorized shaver. The meniscus was balanced with a combination of a motorized shaver and a 50? ArthroCare wand until a stable rim was obtained. ? ? ?The arthroscopic pump was placed on the wash mode and any excess debris was removed from the joint using suction. ? ?60 cc of  Marcaine with epinephrine was injected through the arthroscope. ? ?The portals were closed with 3-0 nylon suture. ? ?A sterile bandage, Ace wrap and Cryo/Cuff was placed and the Cryo/Cuff was activated. The patient was taken to the recovery room in stable condition. ? ?PHYSICIAN ASSISTANT: no ? ?ASSISTANTS: none  ? ?ANESTHESIA:   General ? ?EBL:  none  ? ?BLOOD ADMINISTERED:none ? ?DRAINS: none  ? ?LOCAL MEDICATIONS USED:  MARCAINE    ? ?SPECIMEN:  No Specimen ? ?DISPOSITION OF SPECIMEN:  N/A ? ?COUNTS:  YES ? ? ?DICTATION: .Dragon Dictation ? ?PLAN OF CARE: Discharge to home after PACU ? ?PATIENT DISPOSITION:  PACU - hemodynamically stable. ?  ?Delay start of Pharmacological VTE agent (>24hrs) due to surgical blood loss or risk of bleeding: not applicable ? ?POST OP PLAN  ?WB as tolerated ?SUTURES OUT IN A WEEK  ?AROM  ?BRACE none needed ?

## 2021-10-22 ENCOUNTER — Encounter (HOSPITAL_COMMUNITY): Payer: Self-pay | Admitting: Orthopedic Surgery

## 2021-10-24 ENCOUNTER — Telehealth: Payer: Self-pay | Admitting: Orthopedic Surgery

## 2021-10-24 ENCOUNTER — Encounter: Payer: Self-pay | Admitting: Orthopedic Surgery

## 2021-10-24 ENCOUNTER — Ambulatory Visit (INDEPENDENT_AMBULATORY_CARE_PROVIDER_SITE_OTHER): Payer: Medicaid Other | Admitting: Orthopedic Surgery

## 2021-10-24 DIAGNOSIS — Z9889 Other specified postprocedural states: Secondary | ICD-10-CM

## 2021-10-24 NOTE — Patient Instructions (Signed)
Follow up in 3 weeks ? ?Do home exercises as directed ? ?Continue to ice knee ?

## 2021-10-24 NOTE — Progress Notes (Signed)
FOLLOW UP  ? ?Encounter Diagnosis  ?Name Primary?  ? S/P arthroscopy of left knee October 18, 2021 Yes  ? ? ? ?Chief Complaint  ?Patient presents with  ? Knee Pain  ?  Postop left knee scope, DOS 10/18/21.  Incisions c&d. Sutures harvested today.   ? ? ?Henry Nguyen comes in with no major point complaints.  He exhibits excellent flexion extension of his knee his sutures were removed he had minimal swelling ? ?He can start a home exercise program and see me in 3 weeks should not need any more opioid medication ? ?

## 2021-10-24 NOTE — Telephone Encounter (Signed)
Patient came to office this afternoon inquiring if he may be seen today, 10/24/21, instead of tomorrow morning, for his post op visit from surgery 10/18/21? He appreciates your considering. ?

## 2021-10-25 ENCOUNTER — Encounter: Payer: Medicaid Other | Admitting: Orthopedic Surgery

## 2021-11-14 ENCOUNTER — Ambulatory Visit (INDEPENDENT_AMBULATORY_CARE_PROVIDER_SITE_OTHER): Payer: Medicaid Other | Admitting: Orthopedic Surgery

## 2021-11-14 DIAGNOSIS — S46911A Strain of unspecified muscle, fascia and tendon at shoulder and upper arm level, right arm, initial encounter: Secondary | ICD-10-CM

## 2021-11-14 DIAGNOSIS — Z9889 Other specified postprocedural states: Secondary | ICD-10-CM | POA: Insufficient documentation

## 2021-11-14 MED ORDER — METHOCARBAMOL 750 MG PO TABS
750.0000 mg | ORAL_TABLET | Freq: Four times a day (QID) | ORAL | 0 refills | Status: AC
Start: 2021-11-14 — End: 2021-11-28

## 2021-11-14 NOTE — Patient Instructions (Signed)
Use ice as needed if the knee swells  Take Tylenol for pain or Advil.

## 2021-11-14 NOTE — Progress Notes (Signed)
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  No tenderness or swelling.  KNEE F/U PRN     The patient fell and injured his right shoulder.  When he abducts the shoulder and turns his neck to the right he feels pain in the scapular.  The scapula is tender.  The cuff is intact.  No neurological symptoms    Encounter Diagnoses  Name Primary?   S/P left knee arthroscopy 10/18/21     Strain of right shoulder, initial encounter Yes    Meds ordered this encounter  Medications   methocarbamol (ROBAXIN) 750 MG tablet    Sig: Take 1 tablet (750 mg total) by mouth 4 (four) times daily for 14 days.    Dispense:  56 tablet    Refill:  0

## 2021-12-02 ENCOUNTER — Other Ambulatory Visit: Payer: Self-pay | Admitting: Orthopedic Surgery

## 2021-12-02 MED ORDER — METHOCARBAMOL 750 MG PO TABS: 750.0000 mg | ORAL_TABLET | Freq: Three times a day (TID) | ORAL | 2 refills | Status: DC

## 2021-12-02 NOTE — Telephone Encounter (Signed)
Patient called to request refill  methocarbamol (ROBAXIN) 750 MG tablet 56 tablet       [facility] in [place]

## 2022-03-07 ENCOUNTER — Ambulatory Visit (INDEPENDENT_AMBULATORY_CARE_PROVIDER_SITE_OTHER): Payer: Medicaid Other

## 2022-03-07 ENCOUNTER — Ambulatory Visit: Payer: Medicaid Other | Admitting: Orthopedic Surgery

## 2022-03-07 ENCOUNTER — Encounter: Payer: Self-pay | Admitting: Orthopedic Surgery

## 2022-03-07 VITALS — Ht 69.0 in | Wt 201.0 lb

## 2022-03-07 DIAGNOSIS — M25511 Pain in right shoulder: Secondary | ICD-10-CM | POA: Diagnosis not present

## 2022-03-07 DIAGNOSIS — G8929 Other chronic pain: Secondary | ICD-10-CM

## 2022-03-07 DIAGNOSIS — M75101 Unspecified rotator cuff tear or rupture of right shoulder, not specified as traumatic: Secondary | ICD-10-CM

## 2022-03-07 DIAGNOSIS — M4722 Other spondylosis with radiculopathy, cervical region: Secondary | ICD-10-CM

## 2022-03-07 DIAGNOSIS — M542 Cervicalgia: Secondary | ICD-10-CM | POA: Diagnosis not present

## 2022-03-07 MED ORDER — METHYLPREDNISOLONE ACETATE 40 MG/ML IJ SUSP
40.0000 mg | Freq: Once | INTRAMUSCULAR | Status: AC
  Administered 2022-03-07: 40 mg via INTRA_ARTICULAR

## 2022-03-07 MED ORDER — TIZANIDINE HCL 4 MG PO TABS: 4.0000 mg | ORAL_TABLET | Freq: Four times a day (QID) | ORAL | 0 refills | Status: DC | PRN

## 2022-03-07 MED ORDER — GABAPENTIN 100 MG PO CAPS: 100.0000 mg | ORAL_CAPSULE | Freq: Three times a day (TID) | ORAL | 2 refills | Status: DC

## 2022-03-07 MED ORDER — IBUPROFEN 800 MG PO TABS: 800.0000 mg | ORAL_TABLET | Freq: Three times a day (TID) | ORAL | 1 refills | Status: DC | PRN

## 2022-03-07 NOTE — Progress Notes (Signed)
EVALUATION AND MANAGEMENT   Type of appointment : New problem  PLAN: The patient has rotator cuff syndrome on the right with cervical spondylosis  Recommend medication and physical therapy  Doubt surgical intervention needed at this time  Patient will arrange follow-up in 6 weeks.  Meds ordered this encounter  Medications   ibuprofen (ADVIL) 800 MG tablet    Sig: Take 1 tablet (800 mg total) by mouth every 8 (eight) hours as needed.    Dispense:  90 tablet    Refill:  1   gabapentin (NEURONTIN) 100 MG capsule    Sig: Take 1 capsule (100 mg total) by mouth 3 (three) times daily.    Dispense:  90 capsule    Refill:  2   tiZANidine (ZANAFLEX) 4 MG tablet    Sig: Take 1 tablet (4 mg total) by mouth every 6 (six) hours as needed for muscle spasms.    Dispense:  30 tablet    Refill:  0     Chief Complaint  Patient presents with   Shoulder Pain    Rt shoulder pain worse over the past 2 wks. Does have some numbness/tingling into the neck and down arm.     61 year old male presents with acute right shoulder pain.  He had some periscapular pain in the past we treated him with a muscle relaxer but it has gotten worse in the last 2 weeks.  There is no trauma he does have some pain radiating down the right arm with some numbness in the right thumb    Review of Systems  Musculoskeletal:  Positive for joint pain and neck pain. Negative for falls.  Neurological:  Positive for tingling and sensory change. Negative for focal weakness.     Body mass index is 29.68 kg/m.  Physical Exam Constitutional:      Appearance: Normal appearance.  HENT:     Head: Normocephalic and atraumatic.  Eyes:     Extraocular Movements: Extraocular movements intact.     Pupils: Pupils are equal, round, and reactive to light.  Musculoskeletal:     Comments: Local spine pain when he rotates to the left not so much to the right he is tender in the midline of the cervical spine upper trapezius muscle and  periscapular region  He also has pain and tenderness anterior portion of the right shoulder with painful range of motion and internal/external rotation flexion and abduction  Without notable rotator cuff weakness but arm position prevents true manual muscle testing  Skin:    General: Skin is warm.     Capillary Refill: Capillary refill takes less than 2 seconds.  Neurological:     General: No focal deficit present.     Mental Status: He is alert and oriented to person, place, and time. Mental status is at baseline.     Sensory: Sensory deficit present.     Coordination: Coordination normal.     Deep Tendon Reflexes: Reflexes normal.  Psychiatric:        Mood and Affect: Mood normal.        Behavior: Behavior normal.        Thought Content: Thought content normal.        Judgment: Judgment normal.     Past Medical History:  Diagnosis Date   Arthritis    Diabetes mellitus without complication (HCC)    GERD (gastroesophageal reflux disease)    Hypertension    Past Surgical History:  Procedure Laterality Date   COLONOSCOPY  2010   COLONOSCOPY N/A 07/09/2018   Procedure: COLONOSCOPY;  Surgeon: Danie Binder, MD;  [facility];  Service: Endoscopy;  Laterality: N/A;  11:15   ESOPHAGOGASTRODUODENOSCOPY  2010   KNEE ARTHROSCOPY WITH MEDIAL MENISECTOMY Right 03/01/2021   Procedure: KNEE ARTHROSCOPY WITH MEDIAL MENISECTOMY;  Surgeon: Carole Civil, MD;  Location: [facility];  Service: Orthopedics;  Laterality: Right;   KNEE ARTHROSCOPY WITH MEDIAL MENISECTOMY Left 10/18/2021   Procedure: KNEE ARTHROSCOPY WITH PARTIAL MEDIAL MENISECTOMY TEAR;  Surgeon: Carole Civil, MD;  Location: [facility];  Service: Orthopedics;  Laterality: Left;   POLYPECTOMY  07/09/2018   Procedure: POLYPECTOMY;  Surgeon: Danie Binder, MD;  [facility];  Service: Endoscopy;;   Social History   Tobacco Use   Smoking status: Never   Smokeless tobacco: Never  Vaping Use   Vaping  Use: Never used  Substance Use Topics   Alcohol use: Never    Alcohol/week: 0.0 standard drinks of alcohol   Drug use: Not Currently     Assessment and Plan:  Imaging: I have personally reviewed the images and my personal interpretation of the images:    X-rays of the shoulder show no arthritis  C-spine films show cervical spondylosis and uncovertebral joint narrowing C5-C6 disc space narrowing   Encounter Diagnoses  Name Primary?   Chronic right shoulder pain    Neck pain    Cervical spondylosis with radiculopathy Yes   Rotator cuff syndrome of right shoulder      Cervical spondylosis  Cuff syndrome right shoulder  Recommend physical therapy for cervical spine Mild relaxer to address the muscle spasms in the periscapular region Gabapentin for pain Tylenol for pain NSAID for pain  Recommend injection right shoulder   Procedure note the subacromial injection shoulder RIGHT    Verbal consent was obtained to inject the  RIGHT   Shoulder  Timeout was completed to confirm the injection site is a subacromial space of the  RIGHT  shoulder   Medication used Depo-Medrol 40 mg and lidocaine 1% 3 cc  Anesthesia was provided by ethyl chloride  The injection was performed in the RIGHT  posterior subacromial space. After pinning the skin with alcohol and anesthetized the skin with ethyl chloride the subacromial space was injected using a 20-gauge needle. There were no complications  Sterile dressing was applied.    Meds ordered this encounter  Medications   ibuprofen (ADVIL) 800 MG tablet    Sig: Take 1 tablet (800 mg total) by mouth every 8 (eight) hours as needed.    Dispense:  90 tablet    Refill:  1   gabapentin (NEURONTIN) 100 MG capsule    Sig: Take 1 capsule (100 mg total) by mouth 3 (three) times daily.    Dispense:  90 capsule    Refill:  2   tiZANidine (ZANAFLEX) 4 MG tablet    Sig: Take 1 tablet (4 mg total) by mouth every 6 (six) hours as needed for  muscle spasms.    Dispense:  30 tablet    Refill:  0

## 2022-03-07 NOTE — Patient Instructions (Signed)
For pain take tylenol 500 mg every 6 hrs and   Meds ordered this encounter  Medications   ibuprofen (ADVIL) 800 MG tablet    Sig: Take 1 tablet (800 mg total) by mouth every 8 (eight) hours as needed.    Dispense:  90 tablet    Refill:  1   gabapentin (NEURONTIN) 100 MG capsule    Sig: Take 1 capsule (100 mg total) by mouth 3 (three) times daily.    Dispense:  90 capsule    Refill:  2   tiZANidine (ZANAFLEX) 4 MG tablet    Sig: Take 1 tablet (4 mg total) by mouth every 6 (six) hours as needed for muscle spasms.    Dispense:  30 tablet    Refill:  0   methylPREDNISolone acetate (DEPO-MEDROL) injection 40 mg

## 2022-03-07 NOTE — Patient Instructions (Signed)
Physical therapy has been ordered for you at [facility] in [facility] is the phone number to call, please call to schedule.

## 2022-03-19 ENCOUNTER — Ambulatory Visit: Payer: Medicaid Other

## 2022-03-24 ENCOUNTER — Ambulatory Visit: Payer: Medicaid Other

## 2022-03-31 ENCOUNTER — Ambulatory Visit: Payer: Medicaid Other | Attending: Orthopedic Surgery

## 2022-03-31 ENCOUNTER — Other Ambulatory Visit: Payer: Self-pay

## 2022-03-31 DIAGNOSIS — M4722 Other spondylosis with radiculopathy, cervical region: Secondary | ICD-10-CM | POA: Insufficient documentation

## 2022-03-31 DIAGNOSIS — M542 Cervicalgia: Secondary | ICD-10-CM | POA: Insufficient documentation

## 2022-03-31 DIAGNOSIS — M5412 Radiculopathy, cervical region: Secondary | ICD-10-CM | POA: Diagnosis present

## 2022-03-31 NOTE — Therapy (Signed)
OUTPATIENT PHYSICAL THERAPY CERVICAL EVALUATION   Patient Name: Henry Nguyen MRN: [id number] DOB:1960/12/17, 61 y.o., male Today's Date: 03/31/2022   PT End of Session - 03/31/22 1057     Visit Number 1    Number of Visits 12    Date for PT Re-Evaluation 06/20/22    PT Start Time 1115    PT Stop Time 1157    PT Time Calculation (min) 42 min    Activity Tolerance Patient tolerated treatment well    Behavior During Therapy [facility] for tasks assessed/performed             Past Medical History:  Diagnosis Date   Arthritis    Diabetes mellitus without complication ([place])    GERD (gastroesophageal reflux disease)    Hypertension    Past Surgical History:  Procedure Laterality Date   COLONOSCOPY  2010   COLONOSCOPY N/A 07/09/2018   Procedure: COLONOSCOPY;  Surgeon: Danie Binder, MD;  [facility];  Service: Endoscopy;  Laterality: N/A;  11:15   ESOPHAGOGASTRODUODENOSCOPY  2010   KNEE ARTHROSCOPY WITH MEDIAL MENISECTOMY Right 03/01/2021   Procedure: KNEE ARTHROSCOPY WITH MEDIAL MENISECTOMY;  Surgeon: Carole Civil, MD;  Location: [facility];  Service: Orthopedics;  Laterality: Right;   KNEE ARTHROSCOPY WITH MEDIAL MENISECTOMY Left 10/18/2021   Procedure: KNEE ARTHROSCOPY WITH PARTIAL MEDIAL MENISECTOMY TEAR;  Surgeon: Carole Civil, MD;  Location: [facility];  Service: Orthopedics;  Laterality: Left;   POLYPECTOMY  07/09/2018   Procedure: POLYPECTOMY;  Surgeon: Danie Binder, MD;  [facility];  Service: Endoscopy;;   Patient Active Problem List   Diagnosis Date Noted   S/P left knee arthroscopy 10/18/21  11/14/2021   Arthralgia of lower leg 03/13/2021   Long term (current) use of insulin ([place]) 03/13/2021   Mixed hyperlipidemia 03/13/2021   Osteoarthritis of knee 03/13/2021   Type 2 diabetes mellitus without complications ([place]) 46/56/8127   Vitamin D deficiency 51/70/0174   Folliculitis 94/49/6759   S/P right knee arthroscopy 03/01/21 03/12/2021    Acute medial meniscus tear of left knee    Special screening for malignant neoplasms, colon    Diabetic hyperosmolar non-ketotic state ([place]) 12/01/2017   Hypertension 12/01/2017   GERD (gastroesophageal reflux disease) 12/01/2017   Hyponatremia 12/01/2017   AKI (acute kidney injury) ([place]) 12/01/2017   Rectal bleeding 12/01/2017    PCP: Leonie Douglas, MD  REFERRING PROVIDER: Carole Civil, MD  REFERRING DIAG: Neck pain; Cervical spondylosis with radiculopathy  THERAPY DIAG:  Radiculopathy, cervical region  Rationale for Evaluation and Treatment Rehabilitation  ONSET DATE: 6 months ago  SUBJECTIVE:  SUBJECTIVE STATEMENT: Patient reports that he began having neck and right shoulder about 6 months ago with no known cause. He notes that his pain has been getting worse since it began. He has noticed that turning his head to the right and raising his right arm cause his pain to get worse. He is right hand dominant. He has been having numbness into the first three fingers of his right hand, but he notes that it has been into all five fingers previously.   PERTINENT HISTORY:  HTN, DM, and OA  PAIN:  Are you having pain? Yes: NPRS scale: 9/10 Pain location: neck and right shoulder Pain description: tingling Aggravating factors: movement Relieving factors: medication  PRECAUTIONS: None  WEIGHT BEARING RESTRICTIONS No  FALLS:  Has patient fallen in last 6 months? 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  PLOF: Independent  PATIENT GOALS reduced pain and tingling, improved arm movement  OBJECTIVE:   DIAGNOSTIC FINDINGS:  03/07/22 3 views show C5-C6 narrowing  hyperlordosis mid cervical uncovertebral joint space narrowing and osteophytes   Cervical spondylosis   Note imaging of the shoulder was done at the same visit and the shoulder x-ray has the C-spine film report   Shoulder x-ray [facility] lateral no joint space narrowing no arthritis no humeral head abnormality   Normal right shoulder  PATIENT SURVEYS:  NDI to be completed, as able   COGNITION: Overall cognitive status: Within functional limits for tasks assessed   SENSATION: Light touch: Impaired  and increased sensitivity at right C5-6 and C8 Patient reports intermittent numbness into the first 3 fingers of his right hand.   POSTURE: rounded shoulders and forward head  PALPATION: Painful to palpation: right upper trapezius, levator scapulae, cervical paraspinals, supraspinatus, infraspinatus, latissimus dorsi, and scapular stabilizers   CERVICAL ROM:   Active ROM A/PROM (deg) eval  Flexion 42; pulling across shoulders   Extension 0; limited by pain   Right lateral flexion 14; painful (R>L)  Left lateral flexion 24; painful (R>L)  Right rotation 41; limited by pain  Left rotation 48   (Blank rows = not tested)  UPPER EXTREMITY ROM:  Active ROM Right eval Left eval  Shoulder flexion 98; limited by pain 147  Shoulder extension    Shoulder abduction 90; limited by pain 136  Shoulder adduction    Shoulder extension    Shoulder internal rotation To sacrum; limited by pain To T5  Shoulder external rotation To ear; limited by pain To T4  Elbow flexion    Elbow extension    Wrist flexion    Wrist extension    Wrist ulnar deviation    Wrist radial deviation    Wrist pronation    Wrist supination     (Blank rows = not tested)  UPPER EXTREMITY MMT:  MMT Right eval Left eval  Shoulder flexion 4/5; reproduced familiar pain 4/5  Shoulder extension    Shoulder abduction    Shoulder adduction    Shoulder extension    Shoulder internal rotation    Shoulder external  rotation    Middle trapezius    Lower trapezius    Elbow flexion 4/5; aggravated familiar numbness 4+/5  Elbow extension 3+/5; reproduced familiar pain 5/5  Wrist flexion    Wrist extension    Wrist ulnar deviation    Wrist radial deviation    Wrist pronation    Wrist supination  Grip strength 30; limited by pain 45   (Blank rows = not tested)  CERVICAL SPECIAL TESTS:  Spurling's test: Positive, Distraction test: Positive, and Sharp pursor's test: Negative Distraction was able to complete resolve his familiar symptoms  TODAY'S TREATMENT:     PATIENT EDUCATION:  Education details: prognosis, anatomy, x-ray results, healing Person educated: Patient Education method: Explanation Education comprehension: verbalized understanding   HOME EXERCISE PROGRAM:   ASSESSMENT:  CLINICAL IMPRESSION: Patient is a 61 y.o. male who was seen today for physical therapy evaluation and treatment for right cervical radiculopathy. He presented with high pain severity and irritability with his familiar symptoms being reproduced with special testing, mobility, and strength assessments. Recommend that he continue with skilled physical therapy to address his remaining impairments to return to his prior level of function.    OBJECTIVE IMPAIRMENTS decreased activity tolerance, decreased mobility, decreased ROM, decreased strength, impaired sensation, impaired UE functional use, postural dysfunction, and pain.   ACTIVITY LIMITATIONS carrying, lifting, sleeping, bathing, dressing, reach over head, hygiene/grooming, and caring for others  PARTICIPATION LIMITATIONS: meal prep, cleaning, and driving  PERSONAL FACTORS Time since onset of injury/illness/exacerbation and 3+ comorbidities: HTN, DM, and OA  are also affecting patient's functional outcome.   REHAB POTENTIAL: Good  CLINICAL DECISION MAKING: Evolving/moderate complexity  EVALUATION COMPLEXITY: Moderate   GOALS: Goals reviewed with  patient? No  SHORT TERM GOALS: Target date: 04/21/2022   Patient will be independent with his initial HEP.  Baseline:  Goal status: INITIAL  2.  Patient will be able to demonstrate at least 50 degrees of cervical rotation bilaterally for improved awareness of his surroundings.  Baseline:   Goal status: INITIAL  3.  Patient will be able to complete his daily activities without his familiar pain exceeding 6/10.  Baseline:   Goal status: INITIAL  4.  Patient will be able to demonstrate at least 115 degrees of active right shoulder flexion for improved function reaching overhead.  Baseline:   Goal status: INITIAL  LONG TERM GOALS: Target date: 05/12/2022  Patient will be independent with his advanced HEP.  Baseline:   Goal status: INITIAL  2.  Patient will be able to demonstrate at least 60 degrees of cervical rotation bilaterally for improved safety driving.  Baseline:  Goal status: INITIAL  3.  Patient will be able to complete his daily activities without his familiar pain exceeding 4/10.  Baseline:  Goal status: INITIAL  4.  Patient will be able to demonstrate at least 130 degrees of active right shoulder flexion for improved function reaching overhead.  Baseline:   Goal status: INITIAL  5.  Patient will improve his grip strength to at least 45 pounds with his right hand for improved right upper extremity strength.  Baseline:   Goal status: INITIAL  PLAN: PT FREQUENCY: 2x/week  PT DURATION: 6 weeks  PLANNED INTERVENTIONS: Therapeutic exercises, Therapeutic activity, Neuromuscular re-education, Patient/Family education, Self Care, Joint mobilization, Spinal mobilization, Cryotherapy, Moist heat, Manual therapy, and Re-evaluation  PLAN FOR NEXT SESSION: UBE, manual traction, pulleys, rows, SNAGS, and other appropriately matched interventions   Darlin Coco, PT 03/31/2022, 4:34 PM

## 2022-04-09 ENCOUNTER — Ambulatory Visit: Payer: Medicaid Other

## 2022-04-09 DIAGNOSIS — M5412 Radiculopathy, cervical region: Secondary | ICD-10-CM

## 2022-04-09 NOTE — Therapy (Signed)
OUTPATIENT PHYSICAL THERAPY CERVICAL EVALUATION   Patient Name: Henry Nguyen MRN: [id number] DOB:Dec 24, 1960, 61 y.o., male Today's Date: 04/09/2022   PT End of Session - 04/09/22 0949     Visit Number 2    Number of Visits 12    Date for PT Re-Evaluation 06/20/22    PT Start Time 0949    PT Stop Time 1030    PT Time Calculation (min) 41 min    Activity Tolerance Patient tolerated treatment well    Behavior During Therapy [facility] for tasks assessed/performed             Past Medical History:  Diagnosis Date   Arthritis    Diabetes mellitus without complication ([place])    GERD (gastroesophageal reflux disease)    Hypertension    Past Surgical History:  Procedure Laterality Date   COLONOSCOPY  2010   COLONOSCOPY N/A 07/09/2018   Procedure: COLONOSCOPY;  Surgeon: Danie Binder, MD;  [facility];  Service: Endoscopy;  Laterality: N/A;  11:15   ESOPHAGOGASTRODUODENOSCOPY  2010   KNEE ARTHROSCOPY WITH MEDIAL MENISECTOMY Right 03/01/2021   Procedure: KNEE ARTHROSCOPY WITH MEDIAL MENISECTOMY;  Surgeon: Carole Civil, MD;  Location: [facility];  Service: Orthopedics;  Laterality: Right;   KNEE ARTHROSCOPY WITH MEDIAL MENISECTOMY Left 10/18/2021   Procedure: KNEE ARTHROSCOPY WITH PARTIAL MEDIAL MENISECTOMY TEAR;  Surgeon: Carole Civil, MD;  Location: [facility];  Service: Orthopedics;  Laterality: Left;   POLYPECTOMY  07/09/2018   Procedure: POLYPECTOMY;  Surgeon: Danie Binder, MD;  Location[facility];  Service: Endoscopy;;   Patient Active Problem List   Diagnosis Date Noted   S/P left knee arthroscopy 10/18/21  11/14/2021   Arthralgia of lower leg 03/13/2021   Long term (current) use of insulin ([place]) 03/13/2021   Mixed hyperlipidemia 03/13/2021   Osteoarthritis of knee 03/13/2021   Type 2 diabetes mellitus without complications ([place]) 44/08/4740   Vitamin D deficiency 59/56/3875   Folliculitis 64/33/2951   S/P right knee arthroscopy 03/01/21 03/12/2021    Acute medial meniscus tear of left knee    Special screening for malignant neoplasms, colon    Diabetic hyperosmolar non-ketotic state ([place]) 12/01/2017   Hypertension 12/01/2017   GERD (gastroesophageal reflux disease) 12/01/2017   Hyponatremia 12/01/2017   AKI (acute kidney injury) ([place]) 12/01/2017   Rectal bleeding 12/01/2017    PCP: Leonie Douglas, MD  REFERRING PROVIDER: Carole Civil, MD  REFERRING DIAG: Neck pain; Cervical spondylosis with radiculopathy  THERAPY DIAG:  Radiculopathy, cervical region  Rationale for Evaluation and Treatment Rehabilitation  ONSET DATE: 6 months ago  SUBJECTIVE:  SUBJECTIVE STATEMENT: PT arrives for today's treatment session reporting 7/10 right neck and shoulder pain.   PERTINENT HISTORY:  HTN, DM, and OA  PAIN:  Are you having pain? Yes: NPRS scale: 7/10 Pain location: neck and right shoulder Pain description: tingling Aggravating factors: movement Relieving factors: medication  PRECAUTIONS: None  WEIGHT BEARING RESTRICTIONS No  FALLS:  Has patient fallen in last 6 months? 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  PLOF: Independent  PATIENT GOALS reduced pain and tingling, improved arm movement  OBJECTIVE:   DIAGNOSTIC FINDINGS:  03/07/22 3 views show C5-C6 narrowing hyperlordosis mid cervical uncovertebral joint space narrowing and osteophytes   Cervical spondylosis   Note imaging of the shoulder was done at the same visit and the shoulder x-ray has the C-spine film report   Shoulder x-ray [facility] lateral no joint space narrowing no arthritis no humeral head abnormality   Normal right shoulder  PATIENT  SURVEYS:  NDI 58% (29/50)   COGNITION: Overall cognitive status: Within functional limits for tasks assessed   SENSATION: Light touch: Impaired  and increased sensitivity at right C5-6 and C8 Patient reports intermittent numbness into the first 3 fingers of his right hand.   POSTURE: rounded shoulders and forward head  PALPATION: Painful to palpation: right upper trapezius, levator scapulae, cervical paraspinals, supraspinatus, infraspinatus, latissimus dorsi, and scapular stabilizers   CERVICAL ROM:   Active ROM A/PROM (deg) eval  Flexion 42; pulling across shoulders   Extension 0; limited by pain   Right lateral flexion 14; painful (R>L)  Left lateral flexion 24; painful (R>L)  Right rotation 41; limited by pain  Left rotation 48   (Blank rows = not tested)  UPPER EXTREMITY ROM:  Active ROM Right eval Left eval  Shoulder flexion 98; limited by pain 147  Shoulder extension    Shoulder abduction 90; limited by pain 136  Shoulder adduction    Shoulder extension    Shoulder internal rotation To sacrum; limited by pain To T5  Shoulder external rotation To ear; limited by pain To T4  Elbow flexion    Elbow extension    Wrist flexion    Wrist extension    Wrist ulnar deviation    Wrist radial deviation    Wrist pronation    Wrist supination     (Blank rows = not tested)  UPPER EXTREMITY MMT:  MMT Right eval Left eval  Shoulder flexion 4/5; reproduced familiar pain 4/5  Shoulder extension    Shoulder abduction    Shoulder adduction    Shoulder extension    Shoulder internal rotation    Shoulder external rotation    Middle trapezius    Lower trapezius    Elbow flexion 4/5; aggravated familiar numbness 4+/5  Elbow extension 3+/5; reproduced familiar pain 5/5  Wrist flexion    Wrist extension    Wrist ulnar deviation    Wrist radial deviation    Wrist pronation    Wrist supination    Grip strength 30; limited by pain 45   (Blank rows = not  tested)  CERVICAL SPECIAL TESTS:  Spurling's test: Positive, Distraction test: Positive, and Sharp pursor's test: Negative Distraction was able to complete resolve his familiar symptoms  TODAY'S TREATMENT:  EXERCISE LOG  Exercise Repetitions and Resistance Comments  UBE 8 mins 90 RPM (forward/backward)   Pulleys 5 mins   Shoulder Shrugs 20 reps   Scap Retractions 20 reps   Shoulder Circles 20 reps forward/backward   Rows Red t-band x 20 reps   Extensions Red t-band x 20 reps        Blank cell = exercise not performed today   Manual Therapy Soft Tissue Mobilization: right neck and shoulder, STW/M to right upper trap, levator scap, and paraspinals to decrease pain and tone     Modalities  Date:  Hot Pack: Cervical and Shoulder, 15 mins, Pain and Tone   PATIENT EDUCATION:  Education details: prognosis, anatomy, x-ray results, healing Person educated: Patient Education method: Explanation Education comprehension: verbalized understanding   HOME EXERCISE PROGRAM:   ASSESSMENT:  CLINICAL IMPRESSION: Pt arrives for today's treatment session reporting 7/10 right neck and shoulder pain.  Pt instructed in numerous seated postural and shoulder exercises to increase strength and function.  Pt requiring min cues for proper technique and posture for all newly added exercises.  STW/M performed to right upper trap, levator scap, and cervical paraspinals to decrease pain and tone.  Pt reports 2/10 right neck pain at completion of today's treatment session.   OBJECTIVE IMPAIRMENTS decreased activity tolerance, decreased mobility, decreased ROM, decreased strength, impaired sensation, impaired UE functional use, postural dysfunction, and pain.   ACTIVITY LIMITATIONS carrying, lifting, sleeping, bathing, dressing, reach over head, hygiene/grooming, and caring for others  PARTICIPATION LIMITATIONS: meal prep, cleaning, and driving  PERSONAL FACTORS  Time since onset of injury/illness/exacerbation and 3+ comorbidities: HTN, DM, and OA  are also affecting patient's functional outcome.   REHAB POTENTIAL: Good  CLINICAL DECISION MAKING: Evolving/moderate complexity  EVALUATION COMPLEXITY: Moderate   GOALS: Goals reviewed with patient? No  SHORT TERM GOALS: Target date: 04/21/2022   Patient will be independent with his initial HEP.  Baseline:  Goal status: INITIAL  2.  Patient will be able to demonstrate at least 50 degrees of cervical rotation bilaterally for improved awareness of his surroundings.  Baseline:   Goal status: INITIAL  3.  Patient will be able to complete his daily activities without his familiar pain exceeding 6/10.  Baseline:   Goal status: INITIAL  4.  Patient will be able to demonstrate at least 115 degrees of active right shoulder flexion for improved function reaching overhead.  Baseline:   Goal status: INITIAL  LONG TERM GOALS: Target date: 05/12/2022  Patient will be independent with his advanced HEP.  Baseline:   Goal status: INITIAL  2.  Patient will be able to demonstrate at least 60 degrees of cervical rotation bilaterally for improved safety driving.  Baseline:  Goal status: INITIAL  3.  Patient will be able to complete his daily activities without his familiar pain exceeding 4/10.  Baseline:  Goal status: INITIAL  4.  Patient will be able to demonstrate at least 130 degrees of active right shoulder flexion for improved function reaching overhead.  Baseline:   Goal status: INITIAL  5.  Patient will improve his grip strength to at least 45 pounds with his right hand for improved right upper extremity strength.  Baseline:   Goal status: INITIAL  PLAN: PT FREQUENCY: 2x/week  PT DURATION: 6 weeks  PLANNED INTERVENTIONS: Therapeutic exercises, Therapeutic activity, Neuromuscular re-education, Patient/Family education, Self Care, Joint mobilization, Spinal mobilization, Cryotherapy, Moist  heat, Manual therapy, and Re-evaluation  PLAN FOR NEXT SESSION: UBE, manual traction, pulleys, rows,  SNAGS, and other appropriately matched interventions   Kathrynn Ducking, PTA 04/09/2022, 10:35 AM

## 2022-04-11 ENCOUNTER — Ambulatory Visit: Payer: Medicaid Other

## 2022-04-11 DIAGNOSIS — M5412 Radiculopathy, cervical region: Secondary | ICD-10-CM | POA: Diagnosis not present

## 2022-04-11 NOTE — Therapy (Signed)
OUTPATIENT PHYSICAL THERAPY CERVICAL TREATMENT   Patient Name: Henry Nguyen MRN: [id number] DOB:10-13-1960, 61 y.o., male Today's Date: 04/11/2022   PT End of Session - 04/11/22 0947     Visit Number 3    Number of Visits 12    Date for PT Re-Evaluation 06/20/22    PT Start Time 0945    PT Stop Time 1030    PT Time Calculation (min) 45 min    Activity Tolerance Patient tolerated treatment well    Behavior During Therapy [facility] for tasks assessed/performed             Past Medical History:  Diagnosis Date   Arthritis    Diabetes mellitus without complication ([place])    GERD (gastroesophageal reflux disease)    Hypertension    Past Surgical History:  Procedure Laterality Date   COLONOSCOPY  2010   COLONOSCOPY N/A 07/09/2018   Procedure: COLONOSCOPY;  Surgeon: Danie Binder, MD;  [facility];  Service: Endoscopy;  Laterality: N/A;  11:15   ESOPHAGOGASTRODUODENOSCOPY  2010   KNEE ARTHROSCOPY WITH MEDIAL MENISECTOMY Right 03/01/2021   Procedure: KNEE ARTHROSCOPY WITH MEDIAL MENISECTOMY;  Surgeon: Carole Civil, MD;  Location: [facility];  Service: Orthopedics;  Laterality: Right;   KNEE ARTHROSCOPY WITH MEDIAL MENISECTOMY Left 10/18/2021   Procedure: KNEE ARTHROSCOPY WITH PARTIAL MEDIAL MENISECTOMY TEAR;  Surgeon: Carole Civil, MD;  Location: [facility];  Service: Orthopedics;  Laterality: Left;   POLYPECTOMY  07/09/2018   Procedure: POLYPECTOMY;  Surgeon: Danie Binder, MD;  [facility];  Service: Endoscopy;;   Patient Active Problem List   Diagnosis Date Noted   S/P left knee arthroscopy 10/18/21  11/14/2021   Arthralgia of lower leg 03/13/2021   Long term (current) use of insulin ([place]) 03/13/2021   Mixed hyperlipidemia 03/13/2021   Osteoarthritis of knee 03/13/2021   Type 2 diabetes mellitus without complications ([place]) 41/93/7902   Vitamin D deficiency 40/97/3532   Folliculitis 99/24/2683   S/P right knee arthroscopy 03/01/21 03/12/2021    Acute medial meniscus tear of left knee    Special screening for malignant neoplasms, colon    Diabetic hyperosmolar non-ketotic state ([place]) 12/01/2017   Hypertension 12/01/2017   GERD (gastroesophageal reflux disease) 12/01/2017   Hyponatremia 12/01/2017   AKI (acute kidney injury) ([place]) 12/01/2017   Rectal bleeding 12/01/2017    PCP: Leonie Douglas, MD  REFERRING PROVIDER: Carole Civil, MD  REFERRING DIAG: Neck pain; Cervical spondylosis with radiculopathy  THERAPY DIAG:  Radiculopathy, cervical region  Rationale for Evaluation and Treatment Rehabilitation  ONSET DATE: 6 months ago  SUBJECTIVE:  SUBJECTIVE STATEMENT: PT arrives for today's treatment session reporting 8/10 right neck and shoulder pain.   PERTINENT HISTORY:  HTN, DM, and OA  PAIN:  Are you having pain? Yes: NPRS scale: 8/10 Pain location: neck and right shoulder Pain description: tingling Aggravating factors: movement Relieving factors: medication  PRECAUTIONS: None  WEIGHT BEARING RESTRICTIONS No  FALLS:  Has patient fallen in last 6 months? 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  PLOF: Independent  PATIENT GOALS reduced pain and tingling, improved arm movement  OBJECTIVE:   DIAGNOSTIC FINDINGS:  03/07/22 3 views show C5-C6 narrowing hyperlordosis mid cervical uncovertebral joint space narrowing and osteophytes   Cervical spondylosis   Note imaging of the shoulder was done at the same visit and the shoulder x-ray has the C-spine film report   Shoulder x-ray [facility] lateral no joint space narrowing no arthritis no humeral head abnormality   Normal right shoulder  PATIENT  SURVEYS:  NDI 58% (29/50)   COGNITION: Overall cognitive status: Within functional limits for tasks assessed   SENSATION: Light touch: Impaired  and increased sensitivity at right C5-6 and C8 Patient reports intermittent numbness into the first 3 fingers of his right hand.   POSTURE: rounded shoulders and forward head  PALPATION: Painful to palpation: right upper trapezius, levator scapulae, cervical paraspinals, supraspinatus, infraspinatus, latissimus dorsi, and scapular stabilizers   CERVICAL ROM:   Active ROM A/PROM (deg) eval  Flexion 42; pulling across shoulders   Extension 0; limited by pain   Right lateral flexion 14; painful (R>L)  Left lateral flexion 24; painful (R>L)  Right rotation 41; limited by pain  Left rotation 48   (Blank rows = not tested)  UPPER EXTREMITY ROM:  Active ROM Right eval Left eval  Shoulder flexion 98; limited by pain 147  Shoulder extension    Shoulder abduction 90; limited by pain 136  Shoulder adduction    Shoulder extension    Shoulder internal rotation To sacrum; limited by pain To T5  Shoulder external rotation To ear; limited by pain To T4  Elbow flexion    Elbow extension    Wrist flexion    Wrist extension    Wrist ulnar deviation    Wrist radial deviation    Wrist pronation    Wrist supination     (Blank rows = not tested)  UPPER EXTREMITY MMT:  MMT Right eval Left eval  Shoulder flexion 4/5; reproduced familiar pain 4/5  Shoulder extension    Shoulder abduction    Shoulder adduction    Shoulder extension    Shoulder internal rotation    Shoulder external rotation    Middle trapezius    Lower trapezius    Elbow flexion 4/5; aggravated familiar numbness 4+/5  Elbow extension 3+/5; reproduced familiar pain 5/5  Wrist flexion    Wrist extension    Wrist ulnar deviation    Wrist radial deviation    Wrist pronation    Wrist supination    Grip strength 30; limited by pain 45   (Blank rows = not  tested)  CERVICAL SPECIAL TESTS:  Spurling's test: Positive, Distraction test: Positive, and Sharp pursor's test: Negative Distraction was able to complete resolve his familiar symptoms  TODAY'S TREATMENT:  EXERCISE LOG  Exercise Repetitions and Resistance Comments  UBE 10 mins 90 RPM (forward/backward)   Pulleys 5 mins   Shoulder Shrugs 20 reps   Scap Retractions 20 reps   Shoulder Circles 20 reps forward/backward   Rows Red t-band x 20 reps   Extensions Red t-band x 20 reps   Corner Stretch 10 reps x 5 sec hold   Wall Push Ups 15 reps    Blank cell = exercise not performed today   Manual Therapy Soft Tissue Mobilization: right neck and shoulder, STW/M to right upper trap, levator scap, and paraspinals to decrease pain and tone     PATIENT EDUCATION:  Education details: prognosis, anatomy, x-ray results, healing Person educated: Patient Education method: Explanation Education comprehension: verbalized understanding   HOME EXERCISE PROGRAM:   ASSESSMENT:  CLINICAL IMPRESSION: Pt arrives for today's treatment session reporting 8/10 right neck/shoulder pain.  Pt introduced to corner stretch and wall push ups today with min cues required for proper technique.  Pt instructed to add these two exercises to his HEP.  STW/M performed to right upper trap, levator scap, and cervical paraspinals to decrease pain and tone. Pt reports 5/10 right neck/shoulder pain at completion of today's treatment session.   OBJECTIVE IMPAIRMENTS decreased activity tolerance, decreased mobility, decreased ROM, decreased strength, impaired sensation, impaired UE functional use, postural dysfunction, and pain.   ACTIVITY LIMITATIONS carrying, lifting, sleeping, bathing, dressing, reach over head, hygiene/grooming, and caring for others  PARTICIPATION LIMITATIONS: meal prep, cleaning, and driving  PERSONAL FACTORS Time since onset of injury/illness/exacerbation and  3+ comorbidities: HTN, DM, and OA  are also affecting patient's functional outcome.   REHAB POTENTIAL: Good  CLINICAL DECISION MAKING: Evolving/moderate complexity  EVALUATION COMPLEXITY: Moderate   GOALS: Goals reviewed with patient? No  SHORT TERM GOALS: Target date: 04/21/2022   Patient will be independent with his initial HEP.  Baseline:  Goal status: INITIAL  2.  Patient will be able to demonstrate at least 50 degrees of cervical rotation bilaterally for improved awareness of his surroundings.  Baseline:   Goal status: INITIAL  3.  Patient will be able to complete his daily activities without his familiar pain exceeding 6/10.  Baseline:   Goal status: INITIAL  4.  Patient will be able to demonstrate at least 115 degrees of active right shoulder flexion for improved function reaching overhead.  Baseline:   Goal status: INITIAL  LONG TERM GOALS: Target date: 05/12/2022  Patient will be independent with his advanced HEP.  Baseline:   Goal status: INITIAL  2.  Patient will be able to demonstrate at least 60 degrees of cervical rotation bilaterally for improved safety driving.  Baseline:  Goal status: INITIAL  3.  Patient will be able to complete his daily activities without his familiar pain exceeding 4/10.  Baseline:  Goal status: INITIAL  4.  Patient will be able to demonstrate at least 130 degrees of active right shoulder flexion for improved function reaching overhead.  Baseline:   Goal status: INITIAL  5.  Patient will improve his grip strength to at least 45 pounds with his right hand for improved right upper extremity strength.  Baseline:   Goal status: INITIAL  PLAN: PT FREQUENCY: 2x/week  PT DURATION: 6 weeks  PLANNED INTERVENTIONS: Therapeutic exercises, Therapeutic activity, Neuromuscular re-education, Patient/Family education, Self Care, Joint mobilization, Spinal mobilization, Cryotherapy, Moist heat, Manual therapy, and Re-evaluation  PLAN FOR  NEXT SESSION: UBE, manual traction, pulleys, rows, SNAGS, and other appropriately matched interventions  Kathrynn Ducking, PTA 04/11/2022, 10:42 AM

## 2022-04-18 ENCOUNTER — Encounter: Payer: Medicaid Other | Admitting: Physical Therapy

## 2022-04-21 ENCOUNTER — Ambulatory Visit (INDEPENDENT_AMBULATORY_CARE_PROVIDER_SITE_OTHER): Payer: Medicaid Other | Admitting: Orthopedic Surgery

## 2022-04-21 ENCOUNTER — Encounter: Payer: Self-pay | Admitting: Orthopedic Surgery

## 2022-04-21 DIAGNOSIS — M4722 Other spondylosis with radiculopathy, cervical region: Secondary | ICD-10-CM

## 2022-04-21 DIAGNOSIS — M542 Cervicalgia: Secondary | ICD-10-CM

## 2022-04-21 DIAGNOSIS — G8929 Other chronic pain: Secondary | ICD-10-CM

## 2022-04-21 DIAGNOSIS — M75101 Unspecified rotator cuff tear or rupture of right shoulder, not specified as traumatic: Secondary | ICD-10-CM | POA: Diagnosis not present

## 2022-04-21 DIAGNOSIS — M25511 Pain in right shoulder: Secondary | ICD-10-CM | POA: Diagnosis not present

## 2022-04-21 NOTE — Progress Notes (Signed)
Chief Complaint  Patient presents with   Shoulder Pain    Right- feels therapy is helping has better motion   Neck Pain    Feels therapy is helping not have as much stffness   Encounter Diagnoses  Name Primary?   Chronic right shoulder pain Yes   Neck pain    Cervical spondylosis with radiculopathy    Rotator cuff syndrome of right shoulder    Mr. Carnell did well with ibuprofen gabapentin tizanidine and physical therapy  He exhibits full range of motion improved cervical range of motion and no pain  He will finish his therapy follow-up as needed

## 2022-04-23 ENCOUNTER — Ambulatory Visit: Payer: Medicaid Other | Attending: Orthopedic Surgery | Admitting: Physical Therapy

## 2022-04-23 ENCOUNTER — Encounter: Payer: Self-pay | Admitting: Physical Therapy

## 2022-04-23 DIAGNOSIS — M5412 Radiculopathy, cervical region: Secondary | ICD-10-CM

## 2022-04-23 NOTE — Therapy (Signed)
OUTPATIENT PHYSICAL THERAPY CERVICAL TREATMENT   Patient Name: Henry Nguyen MRN: [id number] DOB:24-Jul-1960, 61 y.o., male Today's Date: 04/23/2022   PT End of Session - 04/23/22 0901     Visit Number 4    Number of Visits 12    Date for PT Re-Evaluation 06/20/22    PT Start Time 0902    PT Stop Time 0946    PT Time Calculation (min) 44 min    Activity Tolerance Patient tolerated treatment well    Behavior During Therapy [facility] for tasks assessed/performed            Past Medical History:  Diagnosis Date   Arthritis    Diabetes mellitus without complication ([place])    GERD (gastroesophageal reflux disease)    Hypertension    Past Surgical History:  Procedure Laterality Date   COLONOSCOPY  2010   COLONOSCOPY N/A 07/09/2018   Procedure: COLONOSCOPY;  Surgeon: Danie Binder, MD;  [facility];  Service: Endoscopy;  Laterality: N/A;  11:15   ESOPHAGOGASTRODUODENOSCOPY  2010   KNEE ARTHROSCOPY WITH MEDIAL MENISECTOMY Right 03/01/2021   Procedure: KNEE ARTHROSCOPY WITH MEDIAL MENISECTOMY;  Surgeon: Carole Civil, MD;  Location: [facility];  Service: Orthopedics;  Laterality: Right;   KNEE ARTHROSCOPY WITH MEDIAL MENISECTOMY Left 10/18/2021   Procedure: KNEE ARTHROSCOPY WITH PARTIAL MEDIAL MENISECTOMY TEAR;  Surgeon: Carole Civil, MD;  Location: [facility];  Service: Orthopedics;  Laterality: Left;   POLYPECTOMY  07/09/2018   Procedure: POLYPECTOMY;  Surgeon: Danie Binder, MD;  [facility];  Service: Endoscopy;;   Patient Active Problem List   Diagnosis Date Noted   S/P left knee arthroscopy 10/18/21  11/14/2021   Arthralgia of lower leg 03/13/2021   Long term (current) use of insulin ([place]) 03/13/2021   Mixed hyperlipidemia 03/13/2021   Osteoarthritis of knee 03/13/2021   Type 2 diabetes mellitus without complications ([place]) 14/97/0263   Vitamin D deficiency 78/58/8502   Folliculitis 77/41/2878   S/P right knee arthroscopy 03/01/21 03/12/2021    Acute medial meniscus tear of left knee    Special screening for malignant neoplasms, colon    Diabetic hyperosmolar non-ketotic state ([place]) 12/01/2017   Hypertension 12/01/2017   GERD (gastroesophageal reflux disease) 12/01/2017   Hyponatremia 12/01/2017   AKI (acute kidney injury) ([place]) 12/01/2017   Rectal bleeding 12/01/2017   PCP: Leonie Douglas, MD  REFERRING PROVIDER: Carole Civil, MD  REFERRING DIAG: Neck pain; Cervical spondylosis with radiculopathy  THERAPY DIAG:  Radiculopathy, cervical region  Rationale for Evaluation and Treatment Rehabilitation  ONSET DATE: 6 months ago  SUBJECTIVE:  SUBJECTIVE STATEMENT: No pain upon arrival and feeling pretty good.  PERTINENT HISTORY:  HTN, DM, and OA  PAIN:  Are you having pain? No.  PRECAUTIONS: None  PATIENT GOALS reduced pain and tingling, improved arm movement  OBJECTIVE:   DIAGNOSTIC FINDINGS:  03/07/22 3 views show C5-C6 narrowing hyperlordosis mid cervical uncovertebral joint space narrowing and osteophytes   Cervical spondylosis   Note imaging of the shoulder was done at the same visit and the shoulder x-ray has the C-spine film report   Shoulder x-ray [facility] lateral no joint space narrowing no arthritis no humeral head abnormality   Normal right shoulder  PATIENT SURVEYS:  NDI 58% (29/50)  CERVICAL ROM:   Active ROM A/PROM (deg) eval  Flexion 42; pulling across shoulders   Extension 0; limited by pain   Right lateral flexion 14; painful (R>L)  Left lateral flexion 24; painful (R>L)  Right rotation 41; limited by pain  Left rotation 48   (Blank rows = not tested)  UPPER EXTREMITY ROM:  Active ROM Right eval Left eval  Shoulder flexion 98; limited by pain 147  Shoulder extension     Shoulder abduction 90; limited by pain 136  Shoulder adduction    Shoulder extension    Shoulder internal rotation To sacrum; limited by pain To T5  Shoulder external rotation To ear; limited by pain To T4  Elbow flexion    Elbow extension    Wrist flexion    Wrist extension    Wrist ulnar deviation    Wrist radial deviation    Wrist pronation    Wrist supination     (Blank rows = not tested)  UPPER EXTREMITY MMT:  MMT Right eval Left eval  Shoulder flexion 4/5; reproduced familiar pain 4/5  Shoulder extension    Shoulder abduction    Shoulder adduction    Shoulder extension    Shoulder internal rotation    Shoulder external rotation    Middle trapezius    Lower trapezius    Elbow flexion 4/5; aggravated familiar numbness 4+/5  Elbow extension 3+/5; reproduced familiar pain 5/5  Wrist flexion    Wrist extension    Wrist ulnar deviation    Wrist radial deviation    Wrist pronation    Wrist supination    Grip strength 30; limited by pain 45   (Blank rows = not tested)  CERVICAL SPECIAL TESTS:  Spurling's test: Positive, Distraction test: Positive, and Sharp pursor's test: Negative Distraction was able to complete resolve his familiar symptoms  TODAY'S TREATMENT:                                     EXERCISE LOG  Exercise Repetitions and Resistance Comments  UBE 10 mins 90 RPM (forward/backward)   Pulleys 5 mins   Shoulder Shrugs 20 reps   Scap Retractions 20 reps   Shoulder Circles 20 reps forward   Rows Red t-band x 20 reps   Extensions Red t-band x 20 reps   External rotation Red x20 reps    Corner Stretch 10 reps x 5 sec hold    Blank cell = exercise not performed today   Manual Therapy Soft Tissue Mobilization: right neck and shoulder, STW/M to right upper trap, levator scap, and paraspinals to decrease pain and tone    PATIENT EDUCATION:  Education details: prognosis, anatomy, x-ray results, healing Person educated: Patient Education method:  Explanation  Education comprehension: verbalized understanding  HOME EXERCISE PROGRAM:  ASSESSMENT:  CLINICAL IMPRESSION: Patient presented in clinic with no neck pain upon arrival. After UBE set up, he reported 5/10 R sided neck pain. Patient able to tolerate all therex session but reported muscle fatigue and R sided neck discomfort. Greater inflammation and tone notable via observation. Neck posture notable with forward head at rest. Mild tone release noted with manual therapy session but patient reported feeling improved following end of session.  OBJECTIVE IMPAIRMENTS decreased activity tolerance, decreased mobility, decreased ROM, decreased strength, impaired sensation, impaired UE functional use, postural dysfunction, and pain.   ACTIVITY LIMITATIONS carrying, lifting, sleeping, bathing, dressing, reach over head, hygiene/grooming, and caring for others  PARTICIPATION LIMITATIONS: meal prep, cleaning, and driving  PERSONAL FACTORS Time since onset of injury/illness/exacerbation and 3+ comorbidities: HTN, DM, and OA  are also affecting patient's functional outcome.   REHAB POTENTIAL: Good  CLINICAL DECISION MAKING: Evolving/moderate complexity  EVALUATION COMPLEXITY: Moderate  GOALS: Goals reviewed with patient? No  SHORT TERM GOALS: Target date: 04/21/2022   Patient will be independent with his initial HEP.  Baseline:  Goal status: INITIAL  2.  Patient will be able to demonstrate at least 50 degrees of cervical rotation bilaterally for improved awareness of his surroundings.  Baseline:   Goal status: INITIAL  3.  Patient will be able to complete his daily activities without his familiar pain exceeding 6/10.  Baseline:   Goal status: INITIAL  4.  Patient will be able to demonstrate at least 115 degrees of active right shoulder flexion for improved function reaching overhead.  Baseline:   Goal status: INITIAL  LONG TERM GOALS: Target date: 05/12/2022  Patient will be  independent with his advanced HEP.  Baseline:   Goal status: INITIAL  2.  Patient will be able to demonstrate at least 60 degrees of cervical rotation bilaterally for improved safety driving.  Baseline:  Goal status: INITIAL  3.  Patient will be able to complete his daily activities without his familiar pain exceeding 4/10.  Baseline:  Goal status: INITIAL  4.  Patient will be able to demonstrate at least 130 degrees of active right shoulder flexion for improved function reaching overhead.  Baseline:   Goal status: INITIAL  5.  Patient will improve his grip strength to at least 45 pounds with his right hand for improved right upper extremity strength.  Baseline:   Goal status: INITIAL  PLAN: PT FREQUENCY: 2x/week  PT DURATION: 6 weeks  PLANNED INTERVENTIONS: Therapeutic exercises, Therapeutic activity, Neuromuscular re-education, Patient/Family education, Self Care, Joint mobilization, Spinal mobilization, Cryotherapy, Moist heat, Manual therapy, and Re-evaluation  PLAN FOR NEXT SESSION: UBE, manual traction, pulleys, rows, SNAGS, and other appropriately matched interventions   Standley Brooking, PTA 04/23/2022, 10:42 AM

## 2022-04-25 ENCOUNTER — Ambulatory Visit: Payer: Medicaid Other | Admitting: Physical Therapy

## 2022-04-25 ENCOUNTER — Encounter: Payer: Self-pay | Admitting: Physical Therapy

## 2022-04-25 DIAGNOSIS — M5412 Radiculopathy, cervical region: Secondary | ICD-10-CM

## 2022-04-25 NOTE — Therapy (Signed)
OUTPATIENT PHYSICAL THERAPY CERVICAL TREATMENT   Patient Name: Henry Nguyen MRN: [id number] DOB:06-13-1961, 61 y.o., male Today's Date: 04/25/2022   PT End of Session - 04/25/22 0912     Visit Number 5    Number of Visits 12    Date for PT Re-Evaluation 06/20/22    PT Start Time 0910    PT Stop Time 0955    PT Time Calculation (min) 45 min    Activity Tolerance Patient tolerated treatment well    Behavior During Therapy [facility] for tasks assessed/performed            Past Medical History:  Diagnosis Date   Arthritis    Diabetes mellitus without complication ([place])    GERD (gastroesophageal reflux disease)    Hypertension    Past Surgical History:  Procedure Laterality Date   COLONOSCOPY  2010   COLONOSCOPY N/A 07/09/2018   Procedure: COLONOSCOPY;  Surgeon: Danie Binder, MD;  [facility];  Service: Endoscopy;  Laterality: N/A;  11:15   ESOPHAGOGASTRODUODENOSCOPY  2010   KNEE ARTHROSCOPY WITH MEDIAL MENISECTOMY Right 03/01/2021   Procedure: KNEE ARTHROSCOPY WITH MEDIAL MENISECTOMY;  Surgeon: Carole Civil, MD;  Location: [facility];  Service: Orthopedics;  Laterality: Right;   KNEE ARTHROSCOPY WITH MEDIAL MENISECTOMY Left 10/18/2021   Procedure: KNEE ARTHROSCOPY WITH PARTIAL MEDIAL MENISECTOMY TEAR;  Surgeon: Carole Civil, MD;  Location: [facility];  Service: Orthopedics;  Laterality: Left;   POLYPECTOMY  07/09/2018   Procedure: POLYPECTOMY;  Surgeon: Danie Binder, MD;  [facility];  Service: Endoscopy;;   Patient Active Problem List   Diagnosis Date Noted   S/P left knee arthroscopy 10/18/21  11/14/2021   Arthralgia of lower leg 03/13/2021   Long term (current) use of insulin ([place]) 03/13/2021   Mixed hyperlipidemia 03/13/2021   Osteoarthritis of knee 03/13/2021   Type 2 diabetes mellitus without complications ([place]) 05/39/7673   Vitamin D deficiency 41/93/7902   Folliculitis 40/97/3532   S/P right knee arthroscopy 03/01/21 03/12/2021    Acute medial meniscus tear of left knee    Special screening for malignant neoplasms, colon    Diabetic hyperosmolar non-ketotic state ([place]) 12/01/2017   Hypertension 12/01/2017   GERD (gastroesophageal reflux disease) 12/01/2017   Hyponatremia 12/01/2017   AKI (acute kidney injury) ([place]) 12/01/2017   Rectal bleeding 12/01/2017   PCP: Leonie Douglas, MD  REFERRING PROVIDER: Carole Civil, MD  REFERRING DIAG: Neck pain; Cervical spondylosis with radiculopathy  THERAPY DIAG:  Radiculopathy, cervical region  Rationale for Evaluation and Treatment Rehabilitation  ONSET DATE: 6 months ago  SUBJECTIVE:  SUBJECTIVE STATEMENT: Reports more soreness and tightness today.  PERTINENT HISTORY:  HTN, DM, and OA  PAIN:  Are you having pain? 7/10 R UT soreness, tight  PRECAUTIONS: None  PATIENT GOALS reduced pain and tingling, improved arm movement  OBJECTIVE:   DIAGNOSTIC FINDINGS:  03/07/22 3 views show C5-C6 narrowing hyperlordosis mid cervical uncovertebral joint space narrowing and osteophytes   Cervical spondylosis   Note imaging of the shoulder was done at the same visit and the shoulder x-ray has the C-spine film report   Shoulder x-ray [facility] lateral no joint space narrowing no arthritis no humeral head abnormality   Normal right shoulder  PATIENT SURVEYS:  NDI 58% (29/50)  CERVICAL ROM:   Active ROM A/PROM (deg) eval  Flexion 42; pulling across shoulders   Extension 0; limited by pain   Right lateral flexion 14; painful (R>L)  Left lateral flexion 24; painful (R>L)  Right rotation 41; limited by pain  Left rotation 48   (Blank rows = not tested)  UPPER EXTREMITY ROM:  Active ROM Right eval Left eval Right 11/3  Shoulder flexion 98; limited by pain 147  142  Shoulder extension     Shoulder abduction 90; limited by pain 136   Shoulder adduction     Shoulder extension     Shoulder internal rotation To sacrum; limited by pain To T5   Shoulder external rotation To ear; limited by pain To T4   Elbow flexion     Elbow extension     Wrist flexion     Wrist extension     Wrist ulnar deviation     Wrist radial deviation     Wrist pronation     Wrist supination      (Blank rows = not tested)  L cervical rotation: 52 deg R cervical rotation: 40 deg  UPPER EXTREMITY MMT:  MMT Right eval Left eval   Shoulder flexion 4/5; reproduced familiar pain 4/5   Shoulder extension     Shoulder abduction     Shoulder adduction     Shoulder extension     Shoulder internal rotation     Shoulder external rotation     Middle trapezius     Lower trapezius     Elbow flexion 4/5; aggravated familiar numbness 4+/5   Elbow extension 3+/5; reproduced familiar pain 5/5   Wrist flexion     Wrist extension     Wrist ulnar deviation     Wrist radial deviation     Wrist pronation     Wrist supination     Grip strength 30; limited by pain 45    (Blank rows = not tested)  CERVICAL SPECIAL TESTS:  Spurling's test: Positive, Distraction test: Positive, and Sharp pursor's test: Negative Distraction was able to complete resolve his familiar symptoms  TODAY'S TREATMENT:                                     EXERCISE LOG  Exercise Repetitions and Resistance Comments  UBE 8 mins 90 RPM (forward/backward)   Pulleys 5 mins   B cervical rotation SNAG  X10 reps each   Cervical extension SNAG  X10 reps   Shoulder shrugs X10 reps   Shoulder retraction Attempted but numbness reported   R UT stretch 3x10 sec   R levator scapula stretch 3x10 sec        Blank cell = exercise not  performed today   Manual Therapy Soft Tissue Mobilization: right neck and shoulder, STW/M to right upper trap, levator scap, and paraspinals to decrease pain and tone    PATIENT  EDUCATION:  Education details: prognosis, anatomy, x-ray results, healing Person educated: Patient Education method: Explanation Education comprehension: verbalized understanding  HOME EXERCISE PROGRAM:  ASSESSMENT:  CLINICAL IMPRESSION: Patient presented in clinic with reports of greater soreness and tightness of the R UT. Patient demonstrated greater R shoulder flexion measurements. R cervical rotation greatly affected at beginning of PT due to pain and tightness. ROM and mobility exercises for cervical spine attempted today but discomfort reported especially in R UT and intermittent reports of numbness especially with scapular retraction. Greater tone of R UT palpable but R cervical rotation improved to 50 deg after manual therapy session.  OBJECTIVE IMPAIRMENTS decreased activity tolerance, decreased mobility, decreased ROM, decreased strength, impaired sensation, impaired UE functional use, postural dysfunction, and pain.   ACTIVITY LIMITATIONS carrying, lifting, sleeping, bathing, dressing, reach over head, hygiene/grooming, and caring for others  PARTICIPATION LIMITATIONS: meal prep, cleaning, and driving  PERSONAL FACTORS Time since onset of injury/illness/exacerbation and 3+ comorbidities: HTN, DM, and OA  are also affecting patient's functional outcome.   REHAB POTENTIAL: Good  CLINICAL DECISION MAKING: Evolving/moderate complexity  EVALUATION COMPLEXITY: Moderate  GOALS: Goals reviewed with patient? No  SHORT TERM GOALS: Target date: 04/21/2022   Patient will be independent with his initial HEP.  Baseline:  Goal status: INITIAL  2.  Patient will be able to demonstrate at least 50 degrees of cervical rotation bilaterally for improved awareness of his surroundings.  Baseline:   Goal status: INITIAL  3.  Patient will be able to complete his daily activities without his familiar pain exceeding 6/10.  Baseline:   Goal status: INITIAL  4.  Patient will be able to  demonstrate at least 115 degrees of active right shoulder flexion for improved function reaching overhead.  Baseline:   Goal status: MET  LONG TERM GOALS: Target date: 05/12/2022  Patient will be independent with his advanced HEP.  Baseline:   Goal status: INITIAL  2.  Patient will be able to demonstrate at least 60 degrees of cervical rotation bilaterally for improved safety driving.  Baseline:  Goal status: INITIAL  3.  Patient will be able to complete his daily activities without his familiar pain exceeding 4/10.  Baseline:  Goal status: INITIAL  4.  Patient will be able to demonstrate at least 130 degrees of active right shoulder flexion for improved function reaching overhead.  Baseline:   Goal status: MET  5.  Patient will improve his grip strength to at least 45 pounds with his right hand for improved right upper extremity strength.  Baseline:   Goal status: INITIAL  PLAN: PT FREQUENCY: 2x/week  PT DURATION: 6 weeks  PLANNED INTERVENTIONS: Therapeutic exercises, Therapeutic activity, Neuromuscular re-education, Patient/Family education, Self Care, Joint mobilization, Spinal mobilization, Cryotherapy, Moist heat, Manual therapy, and Re-evaluation  PLAN FOR NEXT SESSION: UBE, manual traction, pulleys, rows, SNAGS, and other appropriately matched interventions  Standley Brooking, PTA 04/25/2022, 10:10 AM

## 2022-04-30 ENCOUNTER — Encounter: Payer: Self-pay | Admitting: Physical Therapy

## 2022-04-30 ENCOUNTER — Ambulatory Visit: Payer: Medicaid Other | Admitting: Physical Therapy

## 2022-04-30 DIAGNOSIS — M5412 Radiculopathy, cervical region: Secondary | ICD-10-CM

## 2022-04-30 NOTE — Therapy (Signed)
OUTPATIENT PHYSICAL THERAPY CERVICAL TREATMENT   Patient Name: Henry Nguyen MRN: [id number] DOB:March 23, 1961, 61 y.o., male Today's Date: 04/30/2022   PT End of Session - 04/30/22 0901     Visit Number 6    Number of Visits 12    Date for PT Re-Evaluation 06/20/22    PT Start Time 0904    PT Stop Time 0943    PT Time Calculation (min) 39 min    Activity Tolerance Patient tolerated treatment well    Behavior During Therapy [facility] for tasks assessed/performed            Past Medical History:  Diagnosis Date   Arthritis    Diabetes mellitus without complication ([place])    GERD (gastroesophageal reflux disease)    Hypertension    Past Surgical History:  Procedure Laterality Date   COLONOSCOPY  2010   COLONOSCOPY N/A 07/09/2018   Procedure: COLONOSCOPY;  Surgeon: Danie Binder, MD;  [facility];  Service: Endoscopy;  Laterality: N/A;  11:15   ESOPHAGOGASTRODUODENOSCOPY  2010   KNEE ARTHROSCOPY WITH MEDIAL MENISECTOMY Right 03/01/2021   Procedure: KNEE ARTHROSCOPY WITH MEDIAL MENISECTOMY;  Surgeon: Carole Civil, MD;  Location: [facility];  Service: Orthopedics;  Laterality: Right;   KNEE ARTHROSCOPY WITH MEDIAL MENISECTOMY Left 10/18/2021   Procedure: KNEE ARTHROSCOPY WITH PARTIAL MEDIAL MENISECTOMY TEAR;  Surgeon: Carole Civil, MD;  Location: [facility] OR[facility];  Service: Orthopedics;  Laterality: Left;   POLYPECTOMY  07/09/2018   Procedure: POLYPECTOMY;  Surgeon: Danie Binder, MD;  [facility];  Service: Endoscopy;;   Patient Active Problem List   Diagnosis Date Noted   S/P left knee arthroscopy 10/18/21  11/14/2021   Arthralgia of lower leg 03/13/2021   Long term (current) use of insulin ([place]) 03/13/2021   Mixed hyperlipidemia 03/13/2021   Osteoarthritis of knee 03/13/2021   Type 2 diabetes mellitus without complications ([place]) 38/36/5427   Vitamin D deficiency 15/66/4830   Folliculitis 32/20/1992   S/P right knee arthroscopy 03/01/21 03/12/2021    Acute medial meniscus tear of left knee    Special screening for malignant neoplasms, colon    Diabetic hyperosmolar non-ketotic state ([place]) 12/01/2017   Hypertension 12/01/2017   GERD (gastroesophageal reflux disease) 12/01/2017   Hyponatremia 12/01/2017   AKI (acute kidney injury) ([place]) 12/01/2017   Rectal bleeding 12/01/2017   PCP: Leonie Douglas, MD  REFERRING PROVIDER: Carole Civil, MD  REFERRING DIAG: Neck pain; Cervical spondylosis with radiculopathy  THERAPY DIAG:  Radiculopathy, cervical region  Rationale for Evaluation and Treatment Rehabilitation  ONSET DATE: 6 months ago  SUBJECTIVE:  SUBJECTIVE STATEMENT: Reports more pain today and not any relief with PT.  PERTINENT HISTORY:  HTN, DM, and OA  PAIN:  Are you having pain? 9/10 R UT soreness, tight  PRECAUTIONS: None  PATIENT GOALS reduced pain and tingling, improved arm movement  OBJECTIVE:   DIAGNOSTIC FINDINGS:  03/07/22 3 views show C5-C6 narrowing hyperlordosis mid cervical uncovertebral joint space narrowing and osteophytes   Cervical spondylosis   Note imaging of the shoulder was done at the same visit and the shoulder x-ray has the C-spine film report   Shoulder x-ray AP lateral no joint space narrowing no arthritis no humeral head abnormality   Normal right shoulder  PATIENT SURVEYS:  NDI 58% (29/50)  CERVICAL ROM:   Active ROM A/PROM (deg) eval  Flexion 42; pulling across shoulders   Extension 0; limited by pain   Right lateral flexion 14; painful (R>L)  Left lateral flexion 24; painful (R>L)  Right rotation 41; limited by pain  Left rotation 48   (Blank rows = not tested)  UPPER EXTREMITY ROM:  Active ROM Right eval Left eval Right 11/3  Shoulder flexion 98; limited by  pain 147 142  Shoulder extension     Shoulder abduction 90; limited by pain 136   Shoulder adduction     Shoulder extension     Shoulder internal rotation To sacrum; limited by pain To T5   Shoulder external rotation To ear; limited by pain To T4   Elbow flexion     Elbow extension     Wrist flexion     Wrist extension     Wrist ulnar deviation     Wrist radial deviation     Wrist pronation     Wrist supination      (Blank rows = not tested)  L cervical rotation: 52 deg R cervical rotation: 40 deg  UPPER EXTREMITY MMT:  MMT Right eval Left eval   Shoulder flexion 4/5; reproduced familiar pain 4/5   Shoulder extension     Shoulder abduction     Shoulder adduction     Shoulder extension     Shoulder internal rotation     Shoulder external rotation     Middle trapezius     Lower trapezius     Elbow flexion 4/5; aggravated familiar numbness 4+/5   Elbow extension 3+/5; reproduced familiar pain 5/5   Wrist flexion     Wrist extension     Wrist ulnar deviation     Wrist radial deviation     Wrist pronation     Wrist supination     Grip strength 30; limited by pain 45    (Blank rows = not tested)  CERVICAL SPECIAL TESTS:  Spurling's test: Positive, Distraction test: Positive, and Sharp pursor's test: Negative Distraction was able to complete resolve his familiar symptoms  TODAY'S TREATMENT:                                     EXERCISE LOG  Exercise Repetitions and Resistance Comments  UBE 6 mins 90 RPM (forward/backward) VC to relax UT/shoulders  Pulleys 5 mins   B cervical rotation SNAG  X10 reps each   Cervical extension SNAG  X10 reps   Shoulder rolls X15 reps each Forward and backward  Shoulder retraction X15 reps   R UT stretch 3x10 sec   R levator scapula stretch 3x10 sec  Blank cell = exercise not performed today   Manual Therapy Soft Tissue Mobilization: right neck and shoulder, STW/M to right upper trap, levator scap, and paraspinals to  decrease pain and tone    PATIENT EDUCATION:  Education details: prognosis, anatomy, x-ray results, healing Person educated: Patient Education method: Explanation Education comprehension: verbalized understanding  HOME EXERCISE PROGRAM:  ASSESSMENT:  CLINICAL IMPRESSION: Patient presented in clinic with reports of continued high level neck pain. Patient encouraged to contact orthopedist as symptoms have not improved. He did not report any numbness during therex today but limited with cervical ROM for SNAGs and cervical stretches. Patient continues to present with increased muscle tone and soreness of the R UT, levator scapula region. Limited goal progress noted due to continued pain and limitations.  OBJECTIVE IMPAIRMENTS decreased activity tolerance, decreased mobility, decreased ROM, decreased strength, impaired sensation, impaired UE functional use, postural dysfunction, and pain.   ACTIVITY LIMITATIONS carrying, lifting, sleeping, bathing, dressing, reach over head, hygiene/grooming, and caring for others  PARTICIPATION LIMITATIONS: meal prep, cleaning, and driving  PERSONAL FACTORS Time since onset of injury/illness/exacerbation and 3+ comorbidities: HTN, DM, and OA  are also affecting patient's functional outcome.   REHAB POTENTIAL: Good  CLINICAL DECISION MAKING: Evolving/moderate complexity  EVALUATION COMPLEXITY: Moderate  GOALS: Goals reviewed with patient? No  SHORT TERM GOALS: Target date: 04/21/2022   Patient will be independent with his initial HEP.  Baseline:  Goal status: INITIAL  2.  Patient will be able to demonstrate at least 50 degrees of cervical rotation bilaterally for improved awareness of his surroundings.  Baseline:   Goal status: INITIAL  3.  Patient will be able to complete his daily activities without his familiar pain exceeding 6/10.  Baseline:   Goal status: INITIAL  4.  Patient will be able to demonstrate at least 115 degrees of active  right shoulder flexion for improved function reaching overhead.  Baseline:   Goal status: MET  LONG TERM GOALS: Target date: 05/12/2022  Patient will be independent with his advanced HEP.  Baseline:   Goal status: INITIAL  2.  Patient will be able to demonstrate at least 60 degrees of cervical rotation bilaterally for improved safety driving.  Baseline:  Goal status: INITIAL  3.  Patient will be able to complete his daily activities without his familiar pain exceeding 4/10.  Baseline:  Goal status: INITIAL  4.  Patient will be able to demonstrate at least 130 degrees of active right shoulder flexion for improved function reaching overhead.  Baseline:   Goal status: MET  5.  Patient will improve his grip strength to at least 45 pounds with his right hand for improved right upper extremity strength.  Baseline:   Goal status: INITIAL  PLAN: PT FREQUENCY: 2x/week  PT DURATION: 6 weeks  PLANNED INTERVENTIONS: Therapeutic exercises, Therapeutic activity, Neuromuscular re-education, Patient/Family education, Self Care, Joint mobilization, Spinal mobilization, Cryotherapy, Moist heat, Manual therapy, and Re-evaluation  PLAN FOR NEXT SESSION: UBE, manual traction, pulleys, rows, SNAGS, and other appropriately matched interventions  Standley Brooking, PTA 04/30/2022, 10:06 AM

## 2022-05-02 ENCOUNTER — Encounter: Payer: Self-pay | Admitting: Physical Therapy

## 2022-05-02 ENCOUNTER — Ambulatory Visit: Payer: Medicaid Other | Admitting: Physical Therapy

## 2022-05-02 DIAGNOSIS — M5412 Radiculopathy, cervical region: Secondary | ICD-10-CM

## 2022-05-02 NOTE — Therapy (Addendum)
OUTPATIENT PHYSICAL THERAPY CERVICAL TREATMENT   Patient Name: Henry Nguyen MRN: [id number] DOB:[birth year], 61 y.o., male Today's Date: 05/02/2022   PT End of Session - 05/02/22 0917     Visit Number 7    Number of Visits 12    Date for PT Re-Evaluation 06/20/22    PT Start Time 0902    PT Stop Time 0945    PT Time Calculation (min) 43 min    Activity Tolerance Patient tolerated treatment well    Behavior During Therapy [facility] for tasks assessed/performed            Past Medical History:  Diagnosis Date   Arthritis    Diabetes mellitus without complication ([place])    GERD (gastroesophageal reflux disease)    Hypertension    Past Surgical History:  Procedure Laterality Date   COLONOSCOPY  2010   COLONOSCOPY N/A 07/09/2018   Procedure: COLONOSCOPY;  Surgeon: Danie Binder, MD;  [facility];  Service: Endoscopy;  Laterality: N/A;  11:15   ESOPHAGOGASTRODUODENOSCOPY  2010   KNEE ARTHROSCOPY WITH MEDIAL MENISECTOMY Right 03/01/2021   Procedure: KNEE ARTHROSCOPY WITH MEDIAL MENISECTOMY;  Surgeon: Carole Civil, MD;  Location: [facility];  Service: Orthopedics;  Laterality: Right;   KNEE ARTHROSCOPY WITH MEDIAL MENISECTOMY Left 10/18/2021   Procedure: KNEE ARTHROSCOPY WITH PARTIAL MEDIAL MENISECTOMY TEAR;  Surgeon: Carole Civil, MD;  Location: [facility];  Service: Orthopedics;  Laterality: Left;   POLYPECTOMY  07/09/2018   Procedure: POLYPECTOMY;  Surgeon: Danie Binder, MD;  [facility];  Service: Endoscopy;;   Patient Active Problem List   Diagnosis Date Noted   S/P left knee arthroscopy 10/18/21  11/14/2021   Arthralgia of lower leg 03/13/2021   Long term (current) use of insulin ([place]) 03/13/2021   Mixed hyperlipidemia 03/13/2021   Osteoarthritis of knee 03/13/2021   Type 2 diabetes mellitus without complications ([place]) 46/09/7996   Vitamin D deficiency 72/15/8727   Folliculitis 61/84/8592   S/P right knee arthroscopy 03/01/21 03/12/2021    Acute medial meniscus tear of left knee    Special screening for malignant neoplasms, colon    Diabetic hyperosmolar non-ketotic state ([place]) 12/01/2017   Hypertension 12/01/2017   GERD (gastroesophageal reflux disease) 12/01/2017   Hyponatremia 12/01/2017   AKI (acute kidney injury) ([place]) 12/01/2017   Rectal bleeding 12/01/2017   PCP: Leonie Douglas, MD  REFERRING PROVIDER: Carole Civil, MD  REFERRING DIAG: Neck pain; Cervical spondylosis with radiculopathy  THERAPY DIAG:  Radiculopathy, cervical region  Rationale for Evaluation and Treatment Rehabilitation  ONSET DATE: 6 months ago  SUBJECTIVE:  SUBJECTIVE STATEMENT: Reports pain is still getting high with ADLs. Having a so so day. Raked his yard yesterday to get leaves up.  PERTINENT HISTORY:  HTN, DM, and OA  PAIN:  Are you having pain? 7/10 R UT  PRECAUTIONS: None  PATIENT GOALS reduced pain and tingling, improved arm movement  OBJECTIVE:   DIAGNOSTIC FINDINGS:  03/07/22 3 views show C5-C6 narrowing hyperlordosis mid cervical uncovertebral joint space narrowing and osteophytes   Cervical spondylosis   Note imaging of the shoulder was done at the same visit and the shoulder x-ray has the C-spine film report   Shoulder x-ray AP lateral no joint space narrowing no arthritis no humeral head abnormality   Normal right shoulder  PATIENT SURVEYS:  NDI 58% (29/50)  CERVICAL ROM:   Active ROM A/PROM (deg) eval  Flexion 42; pulling across shoulders   Extension 0; limited by pain   Right lateral flexion 14; painful (R>L)  Left lateral flexion 24; painful (R>L)  Right rotation 41; limited by pain  Left rotation 48   (Blank rows = not tested)  UPPER EXTREMITY ROM:  Active ROM Right eval Left eval  Right 11/3  Shoulder flexion 98; limited by pain 147 142  Shoulder extension     Shoulder abduction 90; limited by pain 136   Shoulder adduction     Shoulder extension     Shoulder internal rotation To sacrum; limited by pain To T5   Shoulder external rotation To ear; limited by pain To T4   Elbow flexion     Elbow extension     Wrist flexion     Wrist extension     Wrist ulnar deviation     Wrist radial deviation     Wrist pronation     Wrist supination      (Blank rows = not tested)  L cervical rotation: 52 deg R cervical rotation: 40 deg  UPPER EXTREMITY MMT:  MMT Right eval Left eval   Shoulder flexion 4/5; reproduced familiar pain 4/5   Shoulder extension     Shoulder abduction     Shoulder adduction     Shoulder extension     Shoulder internal rotation     Shoulder external rotation     Middle trapezius     Lower trapezius     Elbow flexion 4/5; aggravated familiar numbness 4+/5   Elbow extension 3+/5; reproduced familiar pain 5/5   Wrist flexion     Wrist extension     Wrist ulnar deviation     Wrist radial deviation     Wrist pronation     Wrist supination     Grip strength 30; limited by pain 45 50   (Blank rows = not tested)  CERVICAL SPECIAL TESTS:  Spurling's test: Positive, Distraction test: Positive, and Sharp pursor's test: Negative Distraction was able to complete resolve his familiar symptoms  TODAY'S TREATMENT:                                     EXERCISE LOG  Exercise Repetitions and Resistance Comments  UBE 8 mins 90 RPM (forward/backward) VC to relax UT/shoulders  Pulleys 5 mins   B cervical rotation SNAG  X10 reps each   Cervical extension SNAG  X15 reps   Shoulder rolls X15 reps each Forward and backward  Shoulder retraction X15 reps   R UT stretch 3x10 sec  Blank cell = exercise not performed today   Manual Therapy Soft Tissue Mobilization: right neck and shoulder, STW/M to right upper trap, levator scap, and paraspinals  to decrease pain and tone    PATIENT EDUCATION:  Education details: prognosis, anatomy, x-ray results, healing Person educated: Patient Education method: Explanation Education comprehension: verbalized understanding  HOME EXERCISE PROGRAM:  ASSESSMENT:  CLINICAL IMPRESSION: Patient presented in clinic with reports of continued high level neck pain. Moderate goal achievement in regards to shoulder ROM and grip strength. Cervical ROM still limited and pain is constant limitation. Patient to contact orthopedist regarding next plan of action. Continued tone palpable in the R UT and levator scapula area.   PHYSICAL THERAPY DISCHARGE SUMMARY  Visits from Start of Care: 7  Current functional level related to goals / functional outcomes: Patient was able to partially meet her goals for physical therapy.  However, he continues to be limited by his familiar pain.   Remaining deficits: Pain, cervical AROM   Education / Equipment: HEP  Patient agrees to discharge. Patient goals were partially met. Patient is being discharged due to lack of progress.  Jacqulynn Cadet, PT, DPT    OBJECTIVE IMPAIRMENTS decreased activity tolerance, decreased mobility, decreased ROM, decreased strength, impaired sensation, impaired UE functional use, postural dysfunction, and pain.   ACTIVITY LIMITATIONS carrying, lifting, sleeping, bathing, dressing, reach over head, hygiene/grooming, and caring for others  PARTICIPATION LIMITATIONS: meal prep, cleaning, and driving  PERSONAL FACTORS Time since onset of injury/illness/exacerbation and 3+ comorbidities: HTN, DM, and OA  are also affecting patient's functional outcome.   REHAB POTENTIAL: Good  CLINICAL DECISION MAKING: Evolving/moderate complexity  EVALUATION COMPLEXITY: Moderate  GOALS: Goals reviewed with patient? No  SHORT TERM GOALS: Target date: 04/21/2022   Patient will be independent with his initial HEP.  Baseline:  Goal status: Unable to  assess  2.  Patient will be able to demonstrate at least 50 degrees of cervical rotation bilaterally for improved awareness of his surroundings.  Baseline:   Goal status: PARTIALLY MET  3.  Patient will be able to complete his daily activities without his familiar pain exceeding 6/10.  Baseline:   Goal status: NOT MET  4.  Patient will be able to demonstrate at least 115 degrees of active right shoulder flexion for improved function reaching overhead.  Baseline:   Goal status: MET  LONG TERM GOALS: Target date: 05/12/2022  Patient will be independent with his advanced HEP.  Baseline:   Goal status: Unable to assess  2.  Patient will be able to demonstrate at least 60 degrees of cervical rotation bilaterally for improved safety driving.  Baseline:  Goal status: NOT MET  3.  Patient will be able to complete his daily activities without his familiar pain exceeding 4/10.  Baseline:  Goal status: NOT MET  4.  Patient will be able to demonstrate at least 130 degrees of active right shoulder flexion for improved function reaching overhead.  Baseline:   Goal status: MET  5.  Patient will improve his grip strength to at least 45 pounds with his right hand for improved right upper extremity strength.  Baseline:   Goal status: MET  PLAN: PT FREQUENCY: 2x/week  PT DURATION: 6 weeks  PLANNED INTERVENTIONS: Therapeutic exercises, Therapeutic activity, Neuromuscular re-education, Patient/Family education, Self Care, Joint mobilization, Spinal mobilization, Cryotherapy, Moist heat, Manual therapy, and Re-evaluation  PLAN FOR NEXT SESSION: DC to return to MD  Standley Brooking, PTA 05/02/2022, 10:30 AM

## 2022-06-02 ENCOUNTER — Ambulatory Visit: Payer: Medicaid Other | Admitting: Orthopedic Surgery

## 2022-06-02 VITALS — BP 150/80 | HR 70

## 2022-06-02 DIAGNOSIS — M542 Cervicalgia: Secondary | ICD-10-CM | POA: Diagnosis not present

## 2022-06-02 DIAGNOSIS — M4722 Other spondylosis with radiculopathy, cervical region: Secondary | ICD-10-CM

## 2022-06-02 MED ORDER — METHOCARBAMOL 750 MG PO TABS: 750.0000 mg | ORAL_TABLET | Freq: Four times a day (QID) | ORAL | 2 refills | Status: DC

## 2022-06-02 MED ORDER — GABAPENTIN 300 MG PO CAPS: 300.0000 mg | ORAL_CAPSULE | Freq: Three times a day (TID) | ORAL | 5 refills | Status: DC

## 2022-06-02 MED ORDER — PREDNISONE 5 MG (21) PO TBPK: ORAL_TABLET | ORAL | 1 refills | Status: DC

## 2022-06-02 NOTE — Progress Notes (Unsigned)
FOLLOW UP   Encounter Diagnosis  Name Primary?   Cervical spondylosis with radiculopathy Yes     Chief Complaint  Patient presents with   Follow-up    Recheck on right shoulder     PRIOR TREATMENT: Ibuprofen, gabapentin, tizanidine, physical therapy Robaxin, Tylenol  Returns with pain in the right side of his neck and right arm associated with numbness and tingling in his right hand  Physical Exam Vitals and nursing note reviewed.  Constitutional:      Appearance: Normal appearance.  HENT:     Head: Normocephalic and atraumatic.  Eyes:     General: No scleral icterus.       Right eye: No discharge.        Left eye: No discharge.     Extraocular Movements: Extraocular movements intact.     Conjunctiva/sclera: Conjunctivae normal.     Pupils: Pupils are equal, round, and reactive to light.  Neck:     Comments: The patient has decreased range of motion especially turning to the right he has tenderness in the midline but especially on the right paracervical musculature down into the trapezius muscle.  Although I cannot detect any reflex changes he does have complaint of numbness and tingling in the right hand radiating from his neck down   Cardiovascular:     Rate and Rhythm: Normal rate.     Pulses: Normal pulses.  Skin:    General: Skin is warm and dry.     Capillary Refill: Capillary refill takes less than 2 seconds.  Neurological:     General: No focal deficit present.     Mental Status: He is alert and oriented to person, place, and time.  Psychiatric:        Mood and Affect: Mood normal.        Behavior: Behavior normal.        Thought Content: Thought content normal.        Judgment: Judgment normal.    C-spine films   3 views show C5-C6 narrowing hyperlordosis mid cervical uncovertebral joint space narrowing and osteophytes   Cervical spondylosis   Note imaging of the shoulder was done at the same visit and the shoulder x-ray has the C-spine film report    Shoulder x-ray AP lateral no joint space narrowing no arthritis no humeral head abnormality   Normal right shoulder  Encounter Diagnosis  Name Primary?   Cervical spondylosis with radiculopathy Yes    61 year old male with chronic cervical spondylosis with radiculopathy recurrent and not controlled  Medication prescription given.   Recommend that he get an MRI of his neck and see a neurosurgeon.  I will call him with the results with the virtual visit  I will start him on some Robaxin and prednisone and Neurontin  Meds ordered this encounter  Medications   gabapentin (NEURONTIN) 300 MG capsule    Sig: Take 1 capsule (300 mg total) by mouth 3 (three) times daily.    Dispense:  90 capsule    Refill:  5   methocarbamol (ROBAXIN) 750 MG tablet    Sig: Take 1 tablet (750 mg total) by mouth 4 (four) times daily.    Dispense:  60 tablet    Refill:  2   predniSONE (STERAPRED UNI-PAK 21 TAB) 5 MG (21) TBPK tablet    Sig: Take by mouth as directed. 5 mg steroid taper    Dispense:  21 tablet    Refill:  1

## 2022-06-02 NOTE — Patient Instructions (Signed)
Central Scheduling [id number]  While we are working on your approval please go ahead and call to schedule your appointment to be done within one week. If you can not get an appointment at [facility] within the next week, ask if they have something sooner at [facility] or [place] if you are able to go to [place] to have the imaging done.  AFTER you have made your imaging appointment, please call our office back at [phone] to schedule an appointment to review your results.  CURRENTLY [employer] ONLY AUTHORIZES IMAGING TO BE PERFORMED AT [facility]. THEY WILL NOT APPROVE ANY OTHER FACILITY  [facility] Address: [address], [place], [place] [address] Phone: [phone]   [place] [address], [place] [address] Phone: [phone]  FOLLOW UP: PHONE VISIT TO REVIEW MRI C SPINE

## 2022-06-24 ENCOUNTER — Ambulatory Visit (HOSPITAL_COMMUNITY): Payer: Medicaid Other

## 2022-06-26 ENCOUNTER — Ambulatory Visit: Payer: Medicaid Other | Admitting: Orthopedic Surgery

## 2022-07-23 ENCOUNTER — Telehealth: Payer: Self-pay | Admitting: Orthopedic Surgery

## 2022-07-23 NOTE — Telephone Encounter (Signed)
I called again got answer this time gave him the number

## 2022-07-23 NOTE — Telephone Encounter (Signed)
Patient called, stated that he was supposed to have a MRI done in December, but he wasn't able to go due to transportation issues.  He is wanting another order for this sent in and he'd like a call.  [phone]

## 2022-07-23 NOTE — Telephone Encounter (Signed)
The order is still in there, he can call to schedule [phone] I called him to advise. Phone disconnected  If he calls back will you give him the number I tried calling him back but line busy

## 2022-08-08 ENCOUNTER — Telehealth: Payer: Self-pay | Admitting: Orthopedic Surgery

## 2022-08-08 ENCOUNTER — Ambulatory Visit (HOSPITAL_COMMUNITY): Payer: Medicaid Other

## 2022-08-08 NOTE — Telephone Encounter (Signed)
Patient called Henry Nguyen and stated that he went to his 6:45am appt for his MRI and they told him it was cancelled and they told him to call his dr's office to see what was going on.  Pt's home # [phone] or [phone]

## 2022-08-08 NOTE — Telephone Encounter (Signed)
I called him to advise, when we get approval he will have to call to RS left message on his cell phone.

## 2022-08-08 NOTE — Telephone Encounter (Signed)
He didn't schedule it until the authorization from medicaid ran out had to start process again. I will call him to discuss

## 2022-09-09 ENCOUNTER — Ambulatory Visit (HOSPITAL_COMMUNITY)
Admission: RE | Admit: 2022-09-09 | Discharge: 2022-09-09 | Disposition: A | Discharge status: Home / Self Care | Payer: Medicaid Other | Source: Ambulatory Visit | Attending: Orthopedic Surgery | Admitting: Orthopedic Surgery

## 2022-09-09 DIAGNOSIS — M4722 Other spondylosis with radiculopathy, cervical region: Secondary | ICD-10-CM | POA: Diagnosis not present

## 2022-09-09 DIAGNOSIS — M542 Cervicalgia: Secondary | ICD-10-CM | POA: Insufficient documentation

## 2022-09-17 ENCOUNTER — Telehealth: Payer: Self-pay | Admitting: Radiology

## 2022-09-17 ENCOUNTER — Telehealth: Payer: Self-pay | Admitting: Orthopedic Surgery

## 2022-09-17 DIAGNOSIS — M4722 Other spondylosis with radiculopathy, cervical region: Secondary | ICD-10-CM

## 2022-09-17 NOTE — Telephone Encounter (Signed)
-----   Message from Carole Civil, MD sent at 09/17/2022 12:35 PM EDT ----- I spoke with Henry Nguyen and told him that his neck MRI shows severe cervical disc disease  I told him we will cancel his April 8 appointment and get him an appointment to see Dr. Lorin Mercy preferably and [facility]

## 2022-09-17 NOTE — Telephone Encounter (Signed)
Spoke with Mr. Hoh he is still having neck and arm pain  MRI shows he has cervical spondylosis and spinal stenosis of the cervical spine  We will arrange an appointment to see a neck specialist and will cancel his April 8 appointment   1. Multilevel cervical spondylosis with resultant moderate spinal stenosis at C5-6. No other significant spinal stenosis within the cervical spine. 2. Multifactorial degenerative changes with resultant multilevel foraminal narrowing as above. Notable findings include moderate right C3 and left C4 foraminal stenosis, moderate to severe right C5 foraminal stenosis, with severe right worse than left C6 foraminal narrowing. 3. Severe right-sided facet degeneration at C5-6. Findings could contribute to neck pain and referred right-sided symptoms.     Electronically Signed   By: Jeannine Boga M.D.   On: 09/12/2022 19:53

## 2022-09-25 ENCOUNTER — Encounter: Payer: Self-pay | Admitting: Orthopaedic Surgery

## 2022-09-25 ENCOUNTER — Ambulatory Visit (INDEPENDENT_AMBULATORY_CARE_PROVIDER_SITE_OTHER): Payer: Medicaid Other | Admitting: Orthopaedic Surgery

## 2022-09-25 VITALS — Ht 68.0 in | Wt 207.0 lb

## 2022-09-25 DIAGNOSIS — M4722 Other spondylosis with radiculopathy, cervical region: Secondary | ICD-10-CM | POA: Diagnosis not present

## 2022-09-25 DIAGNOSIS — M4802 Spinal stenosis, cervical region: Secondary | ICD-10-CM | POA: Insufficient documentation

## 2022-09-25 NOTE — Progress Notes (Signed)
Office Visit Note   Patient: Henry Nguyen           Date of Birth: 05-Apr-1961           MRN: [id number] Visit Date: 09/25/2022              Requested by: Carole Civil, MD [address] [place],  [place] [address] PCP: Leonie Douglas, MD   Assessment & Plan: Visit Diagnoses:  1. Spinal stenosis of cervical region   2. Other spondylosis with radiculopathy, cervical region   3. Neural foraminal stenosis of cervical spine     Plan: We discussed options including cervical epidural.  He is having significant cervical stenosis and anterolisthesis with severe foraminal stenosis C5-6.  We discussed dysphagia and dysphonia with single level cervical fusion overnight stay in the hospital use of postoperative collar use allograft and plate.  He only has single level disease at this point.  Questions were elicited and answered he understands and requests we proceed.  Follow-Up Instructions: No follow-ups on file.   Orders:  No orders of the defined types were placed in this encounter.  No orders of the defined types were placed in this encounter.     Procedures: No procedures performed   Clinical Data: No additional findings.   Subjective: Chief Complaint  Patient presents with   Neck - Pain    HPI 62 year old male referred to me by Dr. Arther Abbott for cervical spondylosis and stenosis with foraminal stenosis at C5-6 with right C6 radiculopathy.  He has had persistent problems with neck pain arm pain for several months.  He did not get relief with the prednisone disc pack.  He has been on Robaxin, Zanaflex, gabapentin.  He has noticed weakness in his arm with gripping objects.  Extreme pain when he turns his neck particularly to the right.  He is right-hand dominant states he cannot open jars has to use his left hand.  Patient is a diabetic on insulin also takes metformin glipizide and Lantus.  Last A1c was 6.7 prior to that 6.6.  Patient does take medications  for hypertension on aspirin a day.  Does have hypertension and currently arthritis previous knee arthroscopies.  Denies gait disturbance.  Review of Systems positive for type 2 diabetes on insulin.  Hypertension elevated cholesterol.  All other systems noncontributory HPI.   Objective: Vital Signs: Ht 5\' 8"  (1.727 m)   Wt 207 lb (93.9 kg)   BMI 31.47 kg/m   Physical Exam Constitutional:      Appearance: He is well-developed.  HENT:     Head: Normocephalic and atraumatic.     Right Ear: External ear normal.     Left Ear: External ear normal.  Eyes:     Pupils: Pupils are equal, round, and reactive to light.  Neck:     Thyroid: No thyromegaly.     Trachea: No tracheal deviation.  Cardiovascular:     Rate and Rhythm: Normal rate.  Pulmonary:     Effort: Pulmonary effort is normal.     Breath sounds: No wheezing.  Abdominal:     General: Bowel sounds are normal.     Palpations: Abdomen is soft.  Musculoskeletal:     Cervical back: Neck supple.  Skin:    General: Skin is warm and dry.     Capillary Refill: Capillary refill takes less than 2 seconds.  Neurological:     Mental Status: He is alert and oriented to person, place, and time.  Psychiatric:        Behavior: Behavior normal.        Thought Content: Thought content normal.        Judgment: Judgment normal.     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  Slight wrist extension weakness decreased sensation C6 distribution.  No thenar atrophy interossei are strong he can make a fist.  Trace biceps weakness on the right normal on the left no triceps weakness.  No lower extremity clonus normal heel-toe gait.  Biceps triceps reflexes are 2+ trace brachial radialis on the right 2+ on the left.  Specialty Comments:  No specialty comments available.  Imaging: arrative & Impression  CLINICAL DATA:  Initial evaluation for chronic neck pain, right shoulder pain with weakness  and numbness extending into the right arm and hand.   EXAM: MRI CERVICAL SPINE WITHOUT CONTRAST   TECHNIQUE: Multiplanar, multisequence MR imaging of the cervical spine was performed. No intravenous contrast was administered.   COMPARISON:  Prior radiograph from 03/07/2022.   FINDINGS: Alignment: Levoscoliosis with mild exaggeration of the normal cervical lordosis. Trace degenerative retrolisthesis of C2 on C3 and C3 on C4.   Vertebrae: Vertebral body height maintained without acute or chronic fracture. Bone marrow signal intensity within normal limits. No worrisome osseous lesions. Discogenic reactive endplate changes present about the C5-6 interspace. Minimal reactive marrow edema present about the right C3-4 facet due to facet arthritis (series 7, image 3). No other abnormal marrow edema.   Cord: Normal signal and morphology.   Posterior Fossa, vertebral arteries, paraspinal tissues: Unremarkable.   Disc levels:   C2-C3: Degenerative intervertebral disc space narrowing with diffuse disc bulge. Associated endplate and uncovertebral spurring, greater on the right. Mild right greater than left facet hypertrophy. No spinal stenosis. Moderate right C3 foraminal narrowing. Left neural foramina remains patent.   C3-C4: Disc bulge with bilateral uncovertebral spurring. Mild right-sided facet hypertrophy. No significant spinal stenosis. Mild to moderate right with moderate left C4 foraminal narrowing.   C4-C5: Disc bulge with bilateral uncovertebral spurring. Superimposed central to left paracentral disc protrusion indents the ventral thecal sac (series 9, image 15). Mild to moderate right greater than left facet hypertrophy. No significant spinal stenosis. Moderate to severe right C5 foraminal stenosis. Left neural foramina remains adequately patent.   C5-C6: Degenerative vertebral disc space narrowing with diffuse disc osteophyte complex. Broad posterior component  flattens and partially effaces the ventral thecal sac. Severe right-sided facet arthrosis, with right greater than left uncovertebral spurring. Resultant moderate spinal stenosis. Severe right worse than left C6 foraminal narrowing.   C6-C7: Mild disc bulge with uncovertebral spurring. No significant spinal stenosis. Mild left C7 foraminal narrowing. Right neural foramina remains patent.   C7-T1:  Negative interspace.  No canal or foraminal stenosis.   IMPRESSION: 1. Multilevel cervical spondylosis with resultant moderate spinal stenosis at C5-6. No other significant spinal stenosis within the cervical spine. 2. Multifactorial degenerative changes with resultant multilevel foraminal narrowing as above. Notable findings include moderate right C3 and left C4 foraminal stenosis, moderate to severe right C5 foraminal stenosis, with severe right worse than left C6 foraminal narrowing. 3. Severe right-sided facet degeneration at C5-6. Findings could contribute to neck pain and referred right-sided symptoms.     Electronically Signed   By: Jeannine Boga M.D.   On: 09/12/2022 19:53     AP lateral cervical spine images 03/07/2022 shows grade 1 anterolisthesis C5-6 disc base narrowing endplate irregularity with endplate  spurring and foraminal stenosis C5-6.  Some mid cervical reversal of curvature.  Impression: C5-6 spondylosis with anterolisthesis and disc base narrowing.  PMFS History: Patient Active Problem List   Diagnosis Date Noted   Spinal stenosis of cervical region 09/25/2022   Other spondylosis with radiculopathy, cervical region 09/25/2022   Neural foraminal stenosis of cervical spine 09/25/2022   S/P left knee arthroscopy 10/18/21  11/14/2021   Arthralgia of lower leg 03/13/2021   Long term (current) use of insulin 03/13/2021   Mixed hyperlipidemia 03/13/2021   Osteoarthritis of knee 03/13/2021   Type 2 diabetes mellitus without complications 0000000   Vitamin  D deficiency 0000000   Folliculitis 0000000   S/P right knee arthroscopy 03/01/21 03/12/2021   Acute medial meniscus tear of left knee    Special screening for malignant neoplasms, colon    Diabetic hyperosmolar non-ketotic state 12/01/2017   Hypertension 12/01/2017   GERD (gastroesophageal reflux disease) 12/01/2017   Hyponatremia 12/01/2017   AKI (acute kidney injury) 12/01/2017   Rectal bleeding 12/01/2017   Past Medical History:  Diagnosis Date   Arthritis    Diabetes mellitus without complication    GERD (gastroesophageal reflux disease)    Hypertension     Family History  Problem Relation Age of Onset   Heart attack Mother    CAD Mother    Diabetes Sister    Hypertension Sister    Colon cancer Neg Hx    Colon polyps Neg Hx     Past Surgical History:  Procedure Laterality Date   COLONOSCOPY  2010   COLONOSCOPY N/A 07/09/2018   Procedure: COLONOSCOPY;  Surgeon: Danie Binder, MD;  Location: [facility];  Service: Endoscopy;  Laterality: N/A;  11:15   ESOPHAGOGASTRODUODENOSCOPY  2010   KNEE ARTHROSCOPY WITH MEDIAL MENISECTOMY Right 03/01/2021   Procedure: KNEE ARTHROSCOPY WITH MEDIAL MENISECTOMY;  Surgeon: Carole Civil, MD;  Location: [facility];  Service: Orthopedics;  Laterality: Right;   KNEE ARTHROSCOPY WITH MEDIAL MENISECTOMY Left 10/18/2021   Procedure: KNEE ARTHROSCOPY WITH PARTIAL MEDIAL MENISECTOMY TEAR;  Surgeon: Carole Civil, MD;  Location: [facility];  Service: Orthopedics;  Laterality: Left;   POLYPECTOMY  07/09/2018   Procedure: POLYPECTOMY;  Surgeon: Danie Binder, MD;  Location: [facility];  Service: Endoscopy;;   Social History   Occupational History   Not on file  Tobacco Use   Smoking status: Never   Smokeless tobacco: Never  Vaping Use   Vaping Use: Never used  Substance and Sexual Activity   Alcohol use: Never    Alcohol/week: 0.0 standard drinks of alcohol   Drug use: Not Currently   Sexual activity: Yes

## 2022-09-25 NOTE — H&P (View-Only) (Signed)
 Office Visit Note   Patient: Henry Nguyen           Date of Birth: 12/22/1960           MRN: [id number] Visit Date: 09/25/2022              Requested by: Harrison, Stanley E, MD [address] [place],  [place] [address] PCP: Browning, Douglas, MD   Assessment & Plan: Visit Diagnoses:  1. Spinal stenosis of cervical region   2. Other spondylosis with radiculopathy, cervical region   3. Neural foraminal stenosis of cervical spine     Plan: We discussed options including cervical epidural.  He is having significant cervical stenosis and anterolisthesis with severe foraminal stenosis C5-6.  We discussed dysphagia and dysphonia with single level cervical fusion overnight stay in the hospital use of postoperative collar use allograft and plate.  He only has single level disease at this point.  Questions were elicited and answered he understands and requests we proceed.  Follow-Up Instructions: No follow-ups on file.   Orders:  No orders of the defined types were placed in this encounter.  No orders of the defined types were placed in this encounter.     Procedures: No procedures performed   Clinical Data: No additional findings.   Subjective: Chief Complaint  Patient presents with   Neck - Pain    HPI 61-year-old male referred to me by Dr. Stanley Harrison for cervical spondylosis and stenosis with foraminal stenosis at C5-6 with right C6 radiculopathy.  He has had persistent problems with neck pain arm pain for several months.  He did not get relief with the prednisone disc pack.  He has been on Robaxin, Zanaflex, gabapentin.  He has noticed weakness in his arm with gripping objects.  Extreme pain when he turns his neck particularly to the right.  He is right-hand dominant states he cannot open jars has to use his left hand.  Patient is a diabetic on insulin also takes metformin glipizide and Lantus.  Last A1c was 6.7 prior to that 6.6.  Patient does take medications  for hypertension on aspirin a day.  Does have hypertension and currently arthritis previous knee arthroscopies.  Denies gait disturbance.  Review of Systems positive for type 2 diabetes on insulin.  Hypertension elevated cholesterol.  All other systems noncontributory HPI.   Objective: Vital Signs: Ht 5' 8" (1.727 m)   Wt 207 lb (93.9 kg)   BMI 31.47 kg/m   Physical Exam Constitutional:      Appearance: He is well-developed.  HENT:     Head: Normocephalic and atraumatic.     Right Ear: External ear normal.     Left Ear: External ear normal.  Eyes:     Pupils: Pupils are equal, round, and reactive to light.  Neck:     Thyroid: No thyromegaly.     Trachea: No tracheal deviation.  Cardiovascular:     Rate and Rhythm: Normal rate.  Pulmonary:     Effort: Pulmonary effort is normal.     Breath sounds: No wheezing.  Abdominal:     General: Bowel sounds are normal.     Palpations: Abdomen is soft.  Musculoskeletal:     Cervical back: Neck supple.  Skin:    General: Skin is warm and dry.     Capillary Refill: Capillary refill takes less than 2 seconds.  Neurological:     Mental Status: He is alert and oriented to person, place, and time.    Psychiatric:        Behavior: Behavior normal.        Thought Content: Thought content normal.        Judgment: Judgment normal.     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  Slight wrist extension weakness decreased sensation C6 distribution.  No thenar atrophy interossei are strong he can make a fist.  Trace biceps weakness on the right normal on the left no triceps weakness.  No lower extremity clonus normal heel-toe gait.  Biceps triceps reflexes are 2+ trace brachial radialis on the right 2+ on the left.  Specialty Comments:  No specialty comments available.  Imaging: arrative & Impression  CLINICAL DATA:  Initial evaluation for chronic neck pain, right shoulder pain with weakness  and numbness extending into the right arm and hand.   EXAM: MRI CERVICAL SPINE WITHOUT CONTRAST   TECHNIQUE: Multiplanar, multisequence MR imaging of the cervical spine was performed. No intravenous contrast was administered.   COMPARISON:  Prior radiograph from 03/07/2022.   FINDINGS: Alignment: Levoscoliosis with mild exaggeration of the normal cervical lordosis. Trace degenerative retrolisthesis of C2 on C3 and C3 on C4.   Vertebrae: Vertebral body height maintained without acute or chronic fracture. Bone marrow signal intensity within normal limits. No worrisome osseous lesions. Discogenic reactive endplate changes present about the C5-6 interspace. Minimal reactive marrow edema present about the right C3-4 facet due to facet arthritis (series 7, image 3). No other abnormal marrow edema.   Cord: Normal signal and morphology.   Posterior Fossa, vertebral arteries, paraspinal tissues: Unremarkable.   Disc levels:   C2-C3: Degenerative intervertebral disc space narrowing with diffuse disc bulge. Associated endplate and uncovertebral spurring, greater on the right. Mild right greater than left facet hypertrophy. No spinal stenosis. Moderate right C3 foraminal narrowing. Left neural foramina remains patent.   C3-C4: Disc bulge with bilateral uncovertebral spurring. Mild right-sided facet hypertrophy. No significant spinal stenosis. Mild to moderate right with moderate left C4 foraminal narrowing.   C4-C5: Disc bulge with bilateral uncovertebral spurring. Superimposed central to left paracentral disc protrusion indents the ventral thecal sac (series 9, image 15). Mild to moderate right greater than left facet hypertrophy. No significant spinal stenosis. Moderate to severe right C5 foraminal stenosis. Left neural foramina remains adequately patent.   C5-C6: Degenerative vertebral disc space narrowing with diffuse disc osteophyte complex. Broad posterior component  flattens and partially effaces the ventral thecal sac. Severe right-sided facet arthrosis, with right greater than left uncovertebral spurring. Resultant moderate spinal stenosis. Severe right worse than left C6 foraminal narrowing.   C6-C7: Mild disc bulge with uncovertebral spurring. No significant spinal stenosis. Mild left C7 foraminal narrowing. Right neural foramina remains patent.   C7-T1:  Negative interspace.  No canal or foraminal stenosis.   IMPRESSION: 1. Multilevel cervical spondylosis with resultant moderate spinal stenosis at C5-6. No other significant spinal stenosis within the cervical spine. 2. Multifactorial degenerative changes with resultant multilevel foraminal narrowing as above. Notable findings include moderate right C3 and left C4 foraminal stenosis, moderate to severe right C5 foraminal stenosis, with severe right worse than left C6 foraminal narrowing. 3. Severe right-sided facet degeneration at C5-6. Findings could contribute to neck pain and referred right-sided symptoms.     Electronically Signed   By: Benjamin  McClintock M.D.   On: 09/12/2022 19:53     AP lateral cervical spine images 03/07/2022 shows grade 1 anterolisthesis C5-6 disc base narrowing endplate irregularity with endplate   spurring and foraminal stenosis C5-6.  Some mid cervical reversal of curvature.  Impression: C5-6 spondylosis with anterolisthesis and disc base narrowing.  PMFS History: Patient Active Problem List   Diagnosis Date Noted   Spinal stenosis of cervical region 09/25/2022   Other spondylosis with radiculopathy, cervical region 09/25/2022   Neural foraminal stenosis of cervical spine 09/25/2022   S/P left knee arthroscopy 10/18/21  11/14/2021   Arthralgia of lower leg 03/13/2021   Long term (current) use of insulin 03/13/2021   Mixed hyperlipidemia 03/13/2021   Osteoarthritis of knee 03/13/2021   Type 2 diabetes mellitus without complications 03/13/2021   Vitamin  D deficiency 03/13/2021   Folliculitis 03/13/2021   S/P right knee arthroscopy 03/01/21 03/12/2021   Acute medial meniscus tear of left knee    Special screening for malignant neoplasms, colon    Diabetic hyperosmolar non-ketotic state 12/01/2017   Hypertension 12/01/2017   GERD (gastroesophageal reflux disease) 12/01/2017   Hyponatremia 12/01/2017   AKI (acute kidney injury) 12/01/2017   Rectal bleeding 12/01/2017   Past Medical History:  Diagnosis Date   Arthritis    Diabetes mellitus without complication    GERD (gastroesophageal reflux disease)    Hypertension     Family History  Problem Relation Age of Onset   Heart attack Mother    CAD Mother    Diabetes Sister    Hypertension Sister    Colon cancer Neg Hx    Colon polyps Neg Hx     Past Surgical History:  Procedure Laterality Date   COLONOSCOPY  2010   COLONOSCOPY N/A 07/09/2018   Procedure: COLONOSCOPY;  Surgeon: Fields, Sandi L, MD;  Location: [facility];  Service: Endoscopy;  Laterality: N/A;  11:15   ESOPHAGOGASTRODUODENOSCOPY  2010   KNEE ARTHROSCOPY WITH MEDIAL MENISECTOMY Right 03/01/2021   Procedure: KNEE ARTHROSCOPY WITH MEDIAL MENISECTOMY;  Surgeon: Harrison, Stanley E, MD;  Location: [facility];  Service: Orthopedics;  Laterality: Right;   KNEE ARTHROSCOPY WITH MEDIAL MENISECTOMY Left 10/18/2021   Procedure: KNEE ARTHROSCOPY WITH PARTIAL MEDIAL MENISECTOMY TEAR;  Surgeon: Harrison, Stanley E, MD;  Location: [facility];  Service: Orthopedics;  Laterality: Left;   POLYPECTOMY  07/09/2018   Procedure: POLYPECTOMY;  Surgeon: Fields, Sandi L, MD;  Location: [facility];  Service: Endoscopy;;   Social History   Occupational History   Not on file  Tobacco Use   Smoking status: Never   Smokeless tobacco: Never  Vaping Use   Vaping Use: Never used  Substance and Sexual Activity   Alcohol use: Never    Alcohol/week: 0.0 standard drinks of alcohol   Drug use: Not Currently   Sexual activity: Yes        

## 2022-09-29 ENCOUNTER — Ambulatory Visit: Payer: Medicaid Other | Admitting: Orthopedic Surgery

## 2022-10-16 ENCOUNTER — Other Ambulatory Visit: Payer: Self-pay | Admitting: Physician Assistant

## 2022-10-16 NOTE — Pre-Procedure Instructions (Signed)
Surgical Instructions    Your procedure is scheduled on Wednesday, May 1st.  Report to [facility] Main Entrance "A" at 10:35 A.M., then check in with the Admitting office.  Call this number if you have problems the morning of surgery:  [phone]  If you have any questions prior to your surgery date call [phone]: Open Monday-Friday 8am-4pm If you experience any cold or flu symptoms such as cough, fever, chills, shortness of breath, etc. between now and your scheduled surgery, please notify us at the above number.     Remember:  Do not eat after midnight the night before your surgery  You may drink clear liquids until 09:35 AM the morning of your surgery.   Clear liquids allowed are: Water, Non-Citrus Juices (without pulp), Carbonated Beverages, Clear Tea, Black Coffee Only (NO MILK, CREAM OR POWDERED CREAMER of any kind), and Gatorade.    Take these medicines the morning of surgery with A SIP OF WATER  omeprazole (PRILOSEC)   If needed: albuterol (VENTOLIN HFA)- bring inhaler with you on day of surgery   Follow your surgeon's instructions on when to stop Aspirin.  If no instructions were given by your surgeon then you will need to call the office to get those instructions.     As of today, STOP taking any Aleve, Naproxen, Ibuprofen, Motrin, Advil, Goody's, BC's, all herbal medications, fish oil, and all vitamins.  WHAT DO I DO ABOUT MY DIABETES MEDICATION?   Do not take glipiZIDE (GLUCOTROL) or metFORMIN (GLUCOPHAGE)  the morning of surgery.  THE NIGHT BEFORE SURGERY, take 23 units (50%) of insulin glargine (LANTUS SOLOSTAR).       HOW TO MANAGE YOUR DIABETES BEFORE AND AFTER SURGERY  Why is it important to control my blood sugar before and after surgery? Improving blood sugar levels before and after surgery helps healing and can limit problems. A way of improving blood sugar control is eating a healthy diet by:  Eating less sugar and carbohydrates  Increasing  activity/exercise  Talking with your doctor about reaching your blood sugar goals High blood sugars (greater than 180 mg/dL) can raise your risk of infections and slow your recovery, so you will need to focus on controlling your diabetes during the weeks before surgery. Make sure that the doctor who takes care of your diabetes knows about your planned surgery including the date and location.  How do I manage my blood sugar before surgery? Check your blood sugar at least 4 times a day, starting 2 days before surgery, to make sure that the level is not too high or low.  Check your blood sugar the morning of your surgery when you wake up and every 2 hours until you get to the Short Stay unit.  If your blood sugar is less than 70 mg/dL, you will need to treat for low blood sugar: Do not take insulin. Treat a low blood sugar (less than 70 mg/dL) with  cup of clear juice (cranberry or apple), 4 glucose tablets, OR glucose gel. Recheck blood sugar in 15 minutes after treatment (to make sure it is greater than 70 mg/dL). If your blood sugar is not greater than 70 mg/dL on recheck, call [phone] for further instructions. Report your blood sugar to the short stay nurse when you get to Short Stay.  If you are admitted to the hospital after surgery: Your blood sugar will be checked by the staff and you will probably be given insulin after surgery (instead of oral diabetes medicines)  to make sure you have good blood sugar levels. The goal for blood sugar control after surgery is 80-180 mg/dL.                     Do NOT Smoke (Tobacco/Vaping) for 24 hours prior to your procedure.  If you use a CPAP at night, you may bring your mask/headgear for your overnight stay.   Contacts, glasses, piercing's, hearing aid's, dentures or partials may not be worn into surgery, please bring cases for these belongings.    For patients admitted to the hospital, discharge time will be determined by your treatment  team.   Patients discharged the day of surgery will not be allowed to drive home, and someone needs to stay with them for 24 hours.  SURGICAL WAITING ROOM VISITATION Patients having surgery or a procedure may have no more than 2 support people in the waiting area - these visitors may rotate.   Children under the age of 41 must have an adult with them who is not the patient. If the patient needs to stay at the hospital during part of their recovery, the visitor guidelines for inpatient rooms apply. Pre-op nurse will coordinate an appropriate time for 1 support person to accompany patient in pre-op.  This support person may not rotate.   Please refer to the [facility] website for the visitor guidelines for Inpatients (after your surgery is over and you are in a regular room).     Day of Surgery: Take a shower with CHG soap. Do not wear jewelry  Do not wear lotions, powders, colognes, or deodorant. Men may shave face and neck. Do not bring valuables to the hospital. [facility] is not responsible for any belongings or valuables. Wear Clean/Comfortable clothing the morning of surgery Remember to brush your teeth WITH YOUR REGULAR TOOTHPASTE.   Please read over the following fact sheets that you were given.    If you received a COVID test during your pre-op visit  it is requested that you wear a mask when out in public, stay away from anyone that may not be feeling well and notify your surgeon if you develop symptoms. If you have been in contact with anyone that has tested positive in the last 10 days please notify you surgeon.

## 2022-10-17 ENCOUNTER — Encounter (HOSPITAL_COMMUNITY)
Admission: RE | Admit: 2022-10-17 | Discharge: 2022-10-17 | Disposition: A | Discharge status: Home / Self Care | Payer: Medicaid Other | Source: Ambulatory Visit | Attending: Orthopaedic Surgery | Admitting: Orthopaedic Surgery

## 2022-10-17 ENCOUNTER — Encounter (HOSPITAL_COMMUNITY): Payer: Self-pay

## 2022-10-17 ENCOUNTER — Other Ambulatory Visit: Payer: Self-pay

## 2022-10-17 VITALS — BP 146/91 | HR 77 | Temp 98.3°F | Resp 18 | Ht 68.0 in | Wt 207.4 lb

## 2022-10-17 DIAGNOSIS — Z01818 Encounter for other preprocedural examination: Secondary | ICD-10-CM | POA: Diagnosis present

## 2022-10-17 DIAGNOSIS — Z6832 Body mass index (BMI) 32.0-32.9, adult: Secondary | ICD-10-CM | POA: Insufficient documentation

## 2022-10-17 DIAGNOSIS — Z794 Long term (current) use of insulin: Secondary | ICD-10-CM | POA: Diagnosis not present

## 2022-10-17 DIAGNOSIS — E669 Obesity, unspecified: Secondary | ICD-10-CM | POA: Insufficient documentation

## 2022-10-17 DIAGNOSIS — E119 Type 2 diabetes mellitus without complications: Secondary | ICD-10-CM | POA: Diagnosis not present

## 2022-10-17 DIAGNOSIS — I10 Essential (primary) hypertension: Secondary | ICD-10-CM | POA: Insufficient documentation

## 2022-10-17 LAB — BASIC METABOLIC PANEL
Anion gap: 7 (ref 5–15)
BUN: 13 mg/dL (ref 8–23)
CO2: 29 mmol/L (ref 22–32)
Calcium: 8.8 mg/dL — ABNORMAL LOW (ref 8.9–10.3)
Chloride: 101 mmol/L (ref 98–111)
Creatinine, Ser: 1.12 mg/dL (ref 0.61–1.24)
GFR, Estimated: >60 mL/min (ref >60)
Glucose, Bld: 163 mg/dL — ABNORMAL HIGH (ref 70–99)
Potassium: 3.7 mmol/L (ref 3.5–5.1)
Sodium: 137 mmol/L (ref 135–145)

## 2022-10-17 LAB — SURGICAL PCR SCREEN
MRSA, PCR: NEGATIVE
Staphylococcus aureus: POSITIVE — AB

## 2022-10-17 LAB — CBC
HCT: 45.6 % (ref 39.0–52.0)
Hemoglobin: 14.9 g/dL (ref 13.0–17.0)
MCH: 28 pg (ref 26.0–34.0)
MCHC: 32.7 g/dL (ref 30.0–36.0)
MCV: 85.7 fL (ref 80.0–100.0)
Platelets: 228 10*3/uL (ref 150–400)
RBC: 5.32 MIL/uL (ref 4.22–5.81)
RDW: 12.6 % (ref 11.5–15.5)
WBC: 8.7 10*3/uL (ref 4.0–10.5)
nRBC: 0 % (ref 0.0–0.2)

## 2022-10-17 LAB — GLUCOSE, CAPILLARY: Glucose-Capillary: 160 mg/dL — ABNORMAL HIGH (ref 70–99)

## 2022-10-17 LAB — HEMOGLOBIN A1C
Hgb A1c MFr Bld: 9.1 % — ABNORMAL HIGH (ref 4.8–5.6)
Mean Plasma Glucose: 214.47 mg/dL

## 2022-10-17 NOTE — Progress Notes (Addendum)
PCP - Dr. Waldon Reining Cardiologist - denies  PPM/ICD - denies   Chest x-ray - denies EKG - 10/17/22 Stress Test - denies ECHO - denies Cardiac Cath - denies  Sleep Study - denies   Fasting Blood Sugar - 120-140 Checks Blood Sugar twice a day  Last dose of GLP1 agonist-  n/a   Blood Thinner Instructions: n/a Aspirin Instructions: f/u with surgeon  ERAS Protcol - yes PRE-SURGERY G2- given at PAT  COVID TEST- n/a   Anesthesia review: yes, A1C is 9.1  Patient denies shortness of breath, fever, cough and chest pain at PAT appointment   All instructions explained to the patient, with a verbal understanding of the material. Patient agrees to go over the instructions while at home for a better understanding. The opportunity to ask questions was provided.

## 2022-10-21 NOTE — Progress Notes (Signed)
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   Preop EKG notable for lateral T wave inversions. Review of prior tracing from 12/01/2017 also shows lateral t wave inversions. I spoke with the patient and he denied any cardiopulmonary symptoms. Denied any chest pain, sob, palpitations, syncope. He says he can go up two flights of stairs without any associated symptoms.   I reviewed the EKG with anesthesiologist Dr. Okey Dupre. He advised that given lack of symptoms and similar tracing in 2019, pt okay to proceed as planned barring acute status change.  Preop labs reviewed, unremarkable.    Zannie Cove [facility] Short Stay Center/Anesthesiology Phone [phone] 10/21/2022 2:47 PM

## 2022-10-22 ENCOUNTER — Ambulatory Visit (HOSPITAL_COMMUNITY): Payer: Medicaid Other | Admitting: Physician Assistant

## 2022-10-22 ENCOUNTER — Other Ambulatory Visit: Payer: Self-pay

## 2022-10-22 ENCOUNTER — Encounter (HOSPITAL_COMMUNITY): Payer: Self-pay | Admitting: Orthopaedic Surgery

## 2022-10-22 ENCOUNTER — Observation Stay (HOSPITAL_COMMUNITY)
Admission: RE | Admit: 2022-10-22 | Discharge: 2022-10-23 | Disposition: A | Discharge status: Home / Self Care | Payer: Medicaid Other | Attending: Cardiology | Admitting: Cardiology

## 2022-10-22 ENCOUNTER — Ambulatory Visit (HOSPITAL_BASED_OUTPATIENT_CLINIC_OR_DEPARTMENT_OTHER): Payer: Medicaid Other | Admitting: Anesthesiology

## 2022-10-22 ENCOUNTER — Encounter (HOSPITAL_COMMUNITY)
Admission: RE | Disposition: A | Discharge status: Home / Self Care | Payer: Self-pay | Source: Home / Self Care | Attending: Cardiology

## 2022-10-22 DIAGNOSIS — M47892 Other spondylosis, cervical region: Secondary | ICD-10-CM | POA: Diagnosis not present

## 2022-10-22 DIAGNOSIS — N189 Chronic kidney disease, unspecified: Secondary | ICD-10-CM | POA: Insufficient documentation

## 2022-10-22 DIAGNOSIS — M4312 Spondylolisthesis, cervical region: Secondary | ICD-10-CM | POA: Insufficient documentation

## 2022-10-22 DIAGNOSIS — I129 Hypertensive chronic kidney disease with stage 1 through stage 4 chronic kidney disease, or unspecified chronic kidney disease: Secondary | ICD-10-CM | POA: Insufficient documentation

## 2022-10-22 DIAGNOSIS — I4891 Unspecified atrial fibrillation: Secondary | ICD-10-CM | POA: Diagnosis present

## 2022-10-22 DIAGNOSIS — Z538 Procedure and treatment not carried out for other reasons: Secondary | ICD-10-CM | POA: Insufficient documentation

## 2022-10-22 DIAGNOSIS — M4722 Other spondylosis with radiculopathy, cervical region: Principal | ICD-10-CM | POA: Insufficient documentation

## 2022-10-22 DIAGNOSIS — I10 Essential (primary) hypertension: Secondary | ICD-10-CM | POA: Diagnosis not present

## 2022-10-22 DIAGNOSIS — M4802 Spinal stenosis, cervical region: Secondary | ICD-10-CM | POA: Diagnosis not present

## 2022-10-22 DIAGNOSIS — Z794 Long term (current) use of insulin: Secondary | ICD-10-CM | POA: Insufficient documentation

## 2022-10-22 DIAGNOSIS — Z7982 Long term (current) use of aspirin: Secondary | ICD-10-CM | POA: Diagnosis not present

## 2022-10-22 DIAGNOSIS — Z7984 Long term (current) use of oral hypoglycemic drugs: Secondary | ICD-10-CM | POA: Diagnosis not present

## 2022-10-22 DIAGNOSIS — Z79899 Other long term (current) drug therapy: Secondary | ICD-10-CM | POA: Insufficient documentation

## 2022-10-22 DIAGNOSIS — E1165 Type 2 diabetes mellitus with hyperglycemia: Secondary | ICD-10-CM | POA: Insufficient documentation

## 2022-10-22 DIAGNOSIS — I480 Paroxysmal atrial fibrillation: Secondary | ICD-10-CM

## 2022-10-22 DIAGNOSIS — E1122 Type 2 diabetes mellitus with diabetic chronic kidney disease: Secondary | ICD-10-CM | POA: Diagnosis not present

## 2022-10-22 DIAGNOSIS — N179 Acute kidney failure, unspecified: Secondary | ICD-10-CM | POA: Diagnosis present

## 2022-10-22 DIAGNOSIS — E119 Type 2 diabetes mellitus without complications: Secondary | ICD-10-CM

## 2022-10-22 DIAGNOSIS — N182 Chronic kidney disease, stage 2 (mild): Secondary | ICD-10-CM | POA: Insufficient documentation

## 2022-10-22 LAB — BASIC METABOLIC PANEL
Anion gap: 8 (ref 5–15)
BUN: 19 mg/dL (ref 8–23)
CO2: 27 mmol/L (ref 22–32)
Calcium: 8.7 mg/dL — ABNORMAL LOW (ref 8.9–10.3)
Chloride: 103 mmol/L (ref 98–111)
Creatinine, Ser: 1.23 mg/dL (ref 0.61–1.24)
GFR, Estimated: >60 mL/min (ref >60)
Glucose, Bld: 171 mg/dL — ABNORMAL HIGH (ref 70–99)
Potassium: 3.5 mmol/L (ref 3.5–5.1)
Sodium: 138 mmol/L (ref 135–145)

## 2022-10-22 LAB — CBC
HCT: 47.6 % (ref 39.0–52.0)
Hemoglobin: 15.3 g/dL (ref 13.0–17.0)
MCH: 27.7 pg (ref 26.0–34.0)
MCHC: 32.1 g/dL (ref 30.0–36.0)
MCV: 86.2 fL (ref 80.0–100.0)
Platelets: 226 10*3/uL (ref 150–400)
RBC: 5.52 MIL/uL (ref 4.22–5.81)
RDW: 12.9 % (ref 11.5–15.5)
WBC: 10.3 10*3/uL (ref 4.0–10.5)
nRBC: 0 % (ref 0.0–0.2)

## 2022-10-22 LAB — MAGNESIUM: Magnesium: 1.8 mg/dL (ref 1.7–2.4)

## 2022-10-22 LAB — TSH: TSH: 1.25 u[IU]/mL (ref 0.350–4.500)

## 2022-10-22 LAB — GLUCOSE, CAPILLARY
Glucose-Capillary: 224 mg/dL — ABNORMAL HIGH (ref 70–99)
Glucose-Capillary: 178 mg/dL — ABNORMAL HIGH (ref 70–99)
Glucose-Capillary: 184 mg/dL — ABNORMAL HIGH (ref 70–99)
Glucose-Capillary: 179 mg/dL — ABNORMAL HIGH (ref 70–99)
Glucose-Capillary: 284 mg/dL — ABNORMAL HIGH (ref 70–99)

## 2022-10-22 SURGERY — CANCELLED PROCEDURE
Anesthesia: General

## 2022-10-22 MED ORDER — INSULIN ASPART 100 UNIT/ML IJ SOLN
INTRAMUSCULAR | Status: AC
  Filled 2022-10-22: qty 1

## 2022-10-22 MED ORDER — ORAL CARE MOUTH RINSE: 15.0000 mL | Freq: Once | OROMUCOSAL | Status: AC

## 2022-10-22 MED ORDER — FENTANYL CITRATE (PF) 250 MCG/5ML IJ SOLN
INTRAMUSCULAR | Status: AC
  Filled 2022-10-22: qty 5

## 2022-10-22 MED ORDER — MIDAZOLAM HCL 2 MG/2ML IJ SOLN
INTRAMUSCULAR | Status: DC | PRN
  Administered 2022-10-22: 2 mg via INTRAVENOUS

## 2022-10-22 MED ORDER — ALBUTEROL SULFATE (2.5 MG/3ML) 0.083% IN NEBU: 2.5000 mg | INHALATION_SOLUTION | Freq: Four times a day (QID) | RESPIRATORY_TRACT | Status: DC | PRN

## 2022-10-22 MED ORDER — PANTOPRAZOLE SODIUM 40 MG PO TBEC
40.0000 mg | DELAYED_RELEASE_TABLET | Freq: Every day | ORAL | Status: DC
  Administered 2022-10-22: 40 mg via ORAL
  Filled 2022-10-22: qty 1

## 2022-10-22 MED ORDER — OXYCODONE HCL 5 MG/5ML PO SOLN: 5.0000 mg | Freq: Once | ORAL | Status: DC | PRN

## 2022-10-22 MED ORDER — ACETAMINOPHEN 500 MG PO TABS
ORAL_TABLET | ORAL | Status: AC
  Administered 2022-10-22: 1000 mg via ORAL
  Filled 2022-10-22: qty 2

## 2022-10-22 MED ORDER — CHLORHEXIDINE GLUCONATE 0.12 % MT SOLN
OROMUCOSAL | Status: AC
  Administered 2022-10-22: 15 mL via OROMUCOSAL
  Filled 2022-10-22: qty 15

## 2022-10-22 MED ORDER — DILTIAZEM HCL-DEXTROSE 125-5 MG/125ML-% IV SOLN (PREMIX)
5.0000 mg/h | INTRAVENOUS | Status: DC
  Administered 2022-10-22: 5 mg/h via INTRAVENOUS
  Administered 2022-10-22: 12.5 mg/h via INTRAVENOUS
  Filled 2022-10-22 (×2): qty 125

## 2022-10-22 MED ORDER — CEFAZOLIN SODIUM-DEXTROSE 2-4 GM/100ML-% IV SOLN: 2.0000 g | INTRAVENOUS | Status: DC

## 2022-10-22 MED ORDER — ONDANSETRON HCL 4 MG/2ML IJ SOLN: 4.0000 mg | Freq: Once | INTRAMUSCULAR | Status: DC | PRN

## 2022-10-22 MED ORDER — OXYCODONE HCL 5 MG PO TABS: 5.0000 mg | ORAL_TABLET | Freq: Once | ORAL | Status: DC | PRN

## 2022-10-22 MED ORDER — APIXABAN 5 MG PO TABS
5.0000 mg | ORAL_TABLET | Freq: Two times a day (BID) | ORAL | Status: DC
  Administered 2022-10-22 – 2022-10-23 (×3): 5 mg via ORAL
  Filled 2022-10-22 (×3): qty 1

## 2022-10-22 MED ORDER — ACETAMINOPHEN 325 MG PO TABS
650.0000 mg | ORAL_TABLET | ORAL | Status: DC | PRN
  Administered 2022-10-22: 650 mg via ORAL
  Filled 2022-10-22: qty 2

## 2022-10-22 MED ORDER — LACTATED RINGERS IV SOLN: INTRAVENOUS | Status: DC

## 2022-10-22 MED ORDER — ACETAMINOPHEN 500 MG PO TABS: 1000.0000 mg | ORAL_TABLET | Freq: Once | ORAL | Status: AC

## 2022-10-22 MED ORDER — CHLORHEXIDINE GLUCONATE 0.12 % MT SOLN: 15.0000 mL | Freq: Once | OROMUCOSAL | Status: AC

## 2022-10-22 MED ORDER — MIDAZOLAM HCL 2 MG/2ML IJ SOLN
INTRAMUSCULAR | Status: AC
  Filled 2022-10-22: qty 2

## 2022-10-22 MED ORDER — HYDROMORPHONE HCL 1 MG/ML IJ SOLN: 0.2500 mg | INTRAMUSCULAR | Status: DC | PRN

## 2022-10-22 MED ORDER — DILTIAZEM LOAD VIA INFUSION
10.0000 mg | Freq: Once | INTRAVENOUS | Status: AC
  Administered 2022-10-22: 10 mg via INTRAVENOUS
  Filled 2022-10-22: qty 10

## 2022-10-22 MED ORDER — NITROGLYCERIN 0.4 MG SL SUBL: 0.4000 mg | SUBLINGUAL_TABLET | SUBLINGUAL | Status: DC | PRN

## 2022-10-22 MED ORDER — INSULIN ASPART 100 UNIT/ML IJ SOLN
0.0000 [IU] | INTRAMUSCULAR | Status: DC | PRN
  Administered 2022-10-22: 2 [IU] via SUBCUTANEOUS

## 2022-10-22 MED ORDER — CEFAZOLIN SODIUM-DEXTROSE 2-4 GM/100ML-% IV SOLN
INTRAVENOUS | Status: AC
  Filled 2022-10-22: qty 100

## 2022-10-22 MED ORDER — AMISULPRIDE (ANTIEMETIC) 5 MG/2ML IV SOLN: 10.0000 mg | Freq: Once | INTRAVENOUS | Status: DC | PRN

## 2022-10-22 MED ORDER — BUPIVACAINE-EPINEPHRINE (PF) 0.5% -1:200000 IJ SOLN
INTRAMUSCULAR | Status: AC
  Filled 2022-10-22: qty 30

## 2022-10-22 MED ORDER — INSULIN ASPART 100 UNIT/ML IJ SOLN
0.0000 [IU] | Freq: Three times a day (TID) | INTRAMUSCULAR | Status: DC
  Administered 2022-10-23: 5 [IU] via SUBCUTANEOUS

## 2022-10-22 MED ORDER — PNEUMOCOCCAL 20-VAL CONJ VACC 0.5 ML IM SUSY
0.5000 mL | PREFILLED_SYRINGE | INTRAMUSCULAR | Status: DC
  Filled 2022-10-22: qty 0.5

## 2022-10-22 SURGICAL SUPPLY — 47 items
AGENT HMST KT MTR STRL THRMB (HEMOSTASIS) ×1
APL SKNCLS STERI-STRIP NONHPOA (GAUZE/BANDAGES/DRESSINGS) ×1
BAG COUNTER SPONGE SURGICOUNT (BAG) ×2 IMPLANT
BAG SPNG CNTER NS LX DISP (BAG) ×1
BENZOIN TINCTURE PRP APPL 2/3 (GAUZE/BANDAGES/DRESSINGS) ×2 IMPLANT
BLADE CLIPPER SURG (BLADE) IMPLANT
BUR ROUND FLUTED 4 SOFT TCH (BURR) ×2 IMPLANT
COLLAR CERV LO CONTOUR FIRM DE (SOFTGOODS) ×1 IMPLANT
COVER MAYO STAND STRL (DRAPES) ×2 IMPLANT
COVER SURGICAL LIGHT HANDLE (MISCELLANEOUS) ×2 IMPLANT
DRAPE C-ARM 42X72 X-RAY (DRAPES) ×2 IMPLANT
DRAPE HALF SHEET 40X57 (DRAPES) ×2 IMPLANT
DRAPE MICROSCOPE SLANT 54X150 (MISCELLANEOUS) ×2 IMPLANT
DURAPREP 6ML APPLICATOR 50/CS (WOUND CARE) ×2 IMPLANT
ELECT COATED BLADE 2.86 ST (ELECTRODE) ×2 IMPLANT
ELECT REM PT RETURN 9FT ADLT (ELECTROSURGICAL) ×1
ELECTRODE REM PT RTRN 9FT ADLT (ELECTROSURGICAL) ×2 IMPLANT
EVACUATOR 1/8 PVC DRAIN (DRAIN) ×2 IMPLANT
GAUZE SPONGE 4X4 12PLY STRL (GAUZE/BANDAGES/DRESSINGS) ×2 IMPLANT
GLOVE BIOGEL PI IND STRL 8 (GLOVE) ×4 IMPLANT
GLOVE ORTHO TXT STRL SZ7.5 (GLOVE) ×4 IMPLANT
GOWN STRL REUS W/ TWL LRG LVL3 (GOWN DISPOSABLE) ×2 IMPLANT
GOWN STRL REUS W/ TWL XL LVL3 (GOWN DISPOSABLE) ×2 IMPLANT
GOWN STRL REUS W/TWL 2XL LVL3 (GOWN DISPOSABLE) ×2 IMPLANT
GOWN STRL REUS W/TWL LRG LVL3 (GOWN DISPOSABLE) ×1
GOWN STRL REUS W/TWL XL LVL3 (GOWN DISPOSABLE) ×1
HALTER HD/CHIN CERV TRACTION D (MISCELLANEOUS) ×2 IMPLANT
KIT BASIN OR (CUSTOM PROCEDURE TRAY) ×2 IMPLANT
KIT TURNOVER KIT B (KITS) ×2 IMPLANT
MANIFOLD NEPTUNE II (INSTRUMENTS) ×2 IMPLANT
NDL 25GX 5/8IN NON SAFETY (NEEDLE) ×1 IMPLANT
NEEDLE 25GX 5/8IN NON SAFETY (NEEDLE) ×1 IMPLANT
NS IRRIG 1000ML POUR BTL (IV SOLUTION) ×2 IMPLANT
PACK ORTHO CERVICAL (CUSTOM PROCEDURE TRAY) ×2 IMPLANT
PAD ARMBOARD 7.5X6 YLW CONV (MISCELLANEOUS) ×4 IMPLANT
PATTIES SURGICAL .5 X.5 (GAUZE/BANDAGES/DRESSINGS) IMPLANT
POSITIONER HEAD DONUT 9IN (MISCELLANEOUS) ×2 IMPLANT
STRIP CLOSURE SKIN 1/2X4 (GAUZE/BANDAGES/DRESSINGS) ×2 IMPLANT
SURGIFLO W/THROMBIN 8M KIT (HEMOSTASIS) ×1 IMPLANT
SUT BONE WAX W31G (SUTURE) ×2 IMPLANT
SUT VIC AB 3-0 PS2 18 (SUTURE) ×1
SUT VIC AB 3-0 PS2 18XBRD (SUTURE) ×2 IMPLANT
SUT VIC AB 4-0 PS2 27 (SUTURE) ×2 IMPLANT
SYR BULB EAR ULCER 3OZ GRN STR (SYRINGE) ×2 IMPLANT
TOWEL GREEN STERILE (TOWEL DISPOSABLE) ×2 IMPLANT
TOWEL GREEN STERILE FF (TOWEL DISPOSABLE) ×2 IMPLANT
WATER STERILE IRR 1000ML POUR (IV SOLUTION) ×2 IMPLANT

## 2022-10-22 NOTE — Anesthesia Postprocedure Evaluation (Signed)
Anesthesia Post Note  Patient: Henry Nguyen  Procedure(s) Performed: Canceled Procedure     Patient location during evaluation: PACU Anesthesia Type: MAC Level of consciousness: awake, awake and alert and oriented Pain management: pain level controlled Vital Signs Assessment: post-procedure vital signs reviewed and stable Respiratory status: spontaneous breathing, nonlabored ventilation and respiratory function stable Cardiovascular status: blood pressure returned to baseline and stable Postop Assessment: no apparent nausea or vomiting Anesthetic complications: no Comments: Case cancelled prior to induction in OR d/t new onset Afib with RVR. Cardiology to see patient in PACU. Normal blood pressure, mentating normally. Asymptomatic    No notable events documented.  Last Vitals:  Vitals:   10/22/22 1059 10/22/22 1315  BP: 100/88 112/88  Pulse: 74 72  Resp: 20 (!) 24  Temp: 36.5 C 36.6 C  SpO2: 96% 100%    Last Pain:  Vitals:   10/22/22 1125  TempSrc:   PainSc: 0-No pain                 Lannie Fields

## 2022-10-22 NOTE — Interval H&P Note (Signed)
History and Physical Interval Note:  10/22/2022 12:25 PM  Henry Nguyen  has presented today for surgery, with the diagnosis of C5-6 spondylosis, stenosis.  The various methods of treatment have been discussed with the patient and family. After consideration of risks, benefits and other options for treatment, the patient has consented to  Procedure(s) with comments: C5-6 ANTERIOR CERVICAL DISCECTOMY FUSION, ALLOGRAFT, PLATE (N/A) - Needs RNFA as a surgical intervention.  The patient's history has been reviewed, patient examined, no change in status, stable for surgery.  I have reviewed the patient's chart and labs.  Questions were answered to the patient's satisfaction.     Eldred Manges

## 2022-10-22 NOTE — H&P (Addendum)
Cardiology H&P   Patient ID: Henry Nguyen MRN: [id number]; DOB: [birth year]  Admit date: 10/22/2022 Date of Consult: 10/22/2022  PCP:  Waldon Reining, MD   [facility] Providers Cardiologist:  New (Dr. Anne Fu)  Patient Profile:   Henry Nguyen is a 62 y.o. male with a history of hypertension, type 2 diabetes mellitus, GERD, arthritis, and cervical spinal stenosis who is being seen 10/22/2022 for the evaluation of new onset atrial fibrillation at the request of Dr. Ophelia Charter.  History of Present Illness:   Mr. Henry Nguyen is a 63 year old male with the above history. He has no known cardiac history and has never had any cardiac work-up before. He does have a history of hypertension and uncontrolled diabetes with hemoglobin A1c of 9.1% earlier this month.  Patient presented to [name] today for planned cervical spine fusion. However, he was noted to be in new onset atrial fibrillation with RVR on arrival so procedure was cancelled. EKG on 10/17/2022 during pre-admission testing showed normal sinus rhythm. However, EKG today shows atrial fibrillation, rate 155 bpm, with LVH and no acute ST/T changes. Cardiology consulted for further evaluation.  At the time of this evaluation, patient is resting comfortably in no acute distress. He is unaware that he is in atrial fibrillation. No palpitations, lightheadedness, dizziness, or syncope. He denies any chest pain or any significant shortness of breath. He states he will sometimes use a family member's inhaler when he is outside weed-eating but no other shortness of breath. No orthopnea, PND, edema. No recent fevers or illnesses, No nasal congestion, cough, nausea, vomiting. No abnormal bleeding in urine or stools.  He denies any history of tobacco abuse. He reports drinking 1-2 beers on the weekend but no excessive alcohol use. He does have a family history of heart disease. He states his mother has had a couple of mild heart attacks and  also has a pacemaker.  Past Medical History:  Diagnosis Date   Arthritis    Diabetes mellitus without complication (HCC)    GERD (gastroesophageal reflux disease)    Hypertension     Past Surgical History:  Procedure Laterality Date   COLONOSCOPY  2010   COLONOSCOPY N/A 07/09/2018   Procedure: COLONOSCOPY;  Surgeon: West Bali, MD;  Location: [facility]O [facility];  Service: Endoscopy;  Laterality: N/A;  11:15   ESOPHAGOGASTRODUODENOSCOPY  2010   KNEE ARTHROSCOPY WITH MEDIAL MENISECTOMY Right 03/01/2021   Procedure: KNEE ARTHROSCOPY WITH MEDIAL MENISECTOMY;  Surgeon: Vickki Hearing, MD;  Location: [facility] ORS;  Service: Orthopedics;  Laterality: Right;   KNEE ARTHROSCOPY WITH MEDIAL MENISECTOMY Left 10/18/2021   Procedure: KNEE ARTHROSCOPY WITH PARTIAL MEDIAL MENISECTOMY TEAR;  Surgeon: Vickki Hearing, MD;  Location: [facility] ORS;  Service: Orthopedics;  Laterality: Left;   POLYPECTOMY  07/09/2018   Procedure: POLYPECTOMY;  Surgeon: West Bali, MD;  Location: [facility];  Service: Endoscopy;;     Home Medications:  Prior to Admission medications   Medication Sig Start Date End Date Taking? Authorizing Provider  albuterol (VENTOLIN HFA) 108 (90 Base) MCG/ACT inhaler Inhale 1-2 puffs into the lungs every 6 (six) hours as needed for wheezing or shortness of breath.   Yes [provider]  aspirin EC 81 MG tablet Take 81 mg by mouth daily.   Yes [provider]  BACK & BODY EXTRA STRENGTH 500-32.5 MG TABS Take 1 tablet by mouth in the morning and at bedtime.   Yes [provider]  glipiZIDE (GLUCOTROL)  10 MG tablet Take 10 mg by mouth daily before breakfast.   Yes [provider]  insulin glargine (LANTUS SOLOSTAR) 100 UNIT/ML Solostar Pen Inject 46 Units into the skin at bedtime. 08/05/21  Yes [provider]  lisinopril (PRINIVIL,ZESTRIL) 20 MG tablet Take 1 tablet (20 mg total) by mouth daily. 12/03/17  Yes Oval Linsey, MD  metFORMIN  (GLUCOPHAGE) 500 MG tablet Take 500 mg by mouth daily with breakfast.  04/13/18  Yes [provider]  omeprazole (PRILOSEC) 20 MG capsule Take 20 mg by mouth daily before breakfast.  04/13/18  Yes [provider]  gabapentin (NEURONTIN) 300 MG capsule Take 1 capsule (300 mg total) by mouth 3 (three) times daily. Patient not taking: Reported on 09/25/2022 06/02/22   Vickki Hearing, MD  ibuprofen (ADVIL) 800 MG tablet Take 1 tablet (800 mg total) by mouth every 8 (eight) hours as needed. Patient not taking: Reported on 10/15/2022 03/07/22   Vickki Hearing, MD  methocarbamol (ROBAXIN) 750 MG tablet Take 1 tablet (750 mg total) by mouth 4 (four) times daily. Patient not taking: Reported on 09/25/2022 06/02/22   Vickki Hearing, MD  predniSONE (STERAPRED UNI-PAK 21 TAB) 5 MG (21) TBPK tablet Take by mouth as directed. 5 mg steroid taper Patient not taking: Reported on 09/25/2022 06/02/22   Vickki Hearing, MD  tiZANidine (ZANAFLEX) 4 MG tablet Take 1 tablet (4 mg total) by mouth every 6 (six) hours as needed for muscle spasms. Patient not taking: Reported on 09/25/2022 03/07/22   Vickki Hearing, MD    Inpatient Medications: Scheduled Meds:  apixaban  5 mg Oral BID   diltiazem  10 mg Intravenous Once   insulin aspart       Continuous Infusions:  ceFAZolin      ceFAZolin (ANCEF) IV     diltiazem (CARDIZEM) infusion     lactated ringers 10 mL/hr at 10/22/22 1303   PRN Meds: amisulpride, ceFAZolin, HYDROmorphone (DILAUDID) injection, insulin aspart, insulin aspart, ondansetron (ZOFRAN) IV, oxyCODONE **OR** oxyCODONE  Allergies:   No Known Allergies  Social History:   Social History   Socioeconomic History   Marital status: Divorced    Spouse name: Not on file   Number of children: 0   Years of education: Not on file   Highest education level: Not on file  Occupational History   Not on file  Tobacco Use   Smoking status: Never   Smokeless tobacco: Never   Vaping Use   Vaping Use: Never used  Substance and Sexual Activity   Alcohol use: Yes    Alcohol/week: 3.0 standard drinks of alcohol    Types: 3 Cans of beer per week   Drug use: Not Currently   Sexual activity: Yes  Other Topics Concern   Not on file  Social History Narrative   Working a [employer]. Single. No kids.   Social Determinants of Health   Financial Resource Strain: Not on file  Food Insecurity: Not on file  Transportation Needs: Not on file  Physical Activity: Not on file  Stress: Not on file  Social Connections: Not on file  Intimate Partner Violence: Not on file    Family History:   Family History  Problem Relation Age of Onset   Heart attack Mother    CAD Mother    Diabetes Sister    Hypertension Sister    Colon cancer Neg Hx    Colon polyps Neg Hx      ROS:  Please see the history of present illness.  Review of Systems  Constitutional:  Negative for chills and fever.  HENT:  Negative for congestion.   Respiratory:  Negative for cough and shortness of breath.   Cardiovascular:  Negative for chest pain, palpitations, orthopnea, leg swelling and PND.  Gastrointestinal:  Negative for blood in stool, melena, nausea and vomiting.  Genitourinary:  Negative for hematuria.  Musculoskeletal:  Positive for neck pain (chronic). Negative for myalgias.  Neurological:  Negative for dizziness and loss of consciousness.  Endo/Heme/Allergies:  Does not bruise/bleed easily.  Psychiatric/Behavioral:  Negative for substance abuse.     Physical Exam/Data:   Vitals:   10/22/22 1345 10/22/22 1400 10/22/22 1415 10/22/22 1430  BP: 136/72 113/87 115/73 124/86  Pulse: 78 65 89   Resp: 16 14 (!) 27   Temp:      TempSrc:      SpO2: 97% 100% 99%   Weight:      Height:       No intake or output data in the 24 hours ending 10/22/22 1434    10/22/2022   10:59 AM 10/17/2022    8:42 AM 09/25/2022    1:00 PM  Last 3 Weights  Weight (lbs) 210 lb 207 lb 6.4 oz 207 lb   Weight (kg) 95.255 kg 94.076 kg 93.895 kg     Body mass index is 31.93 kg/m.  General: 62 y.o. African-American male resting comfortably in no acute distress. HEENT: Normocephalic and atraumatic. Sclera clear.  Neck: Supple. No carotid bruits. No JVD. Heart: Tachycardic with irregularly irregular rhythm. No murmurs, gallops, or rubs.  Lungs: No increased work of breathing. Clear to ausculation bilaterally. No wheezes, rhonchi, or rales.  Abdomen: Soft, non-distended, and non-tender to palpation.  Extremities: No lower extremity edema.    Skin: Warm and dry. Neuro: Alert and oriented x3. No focal deficits. Psych: Normal affect. Responds appropriately.   EKG:  The EKG was personally reviewed and demonstrates:  Atrial fibrillation, rate 155 bpm, with LVH and no acute ST/T changes. Telemetry:  Telemetry was personally reviewed and demonstrates:  Atrial fibrillation with rates in the 140s to 170s.  Relevant CV Studies: None.  Laboratory Data:  High Sensitivity Troponin:  No results for input(s): "TROPONINIHS" in the last 720 hours.   Chemistry Recent Labs  Lab 10/17/22 0916  NA 137  K 3.7  CL 101  CO2 29  GLUCOSE 163*  BUN 13  CREATININE 1.12  CALCIUM 8.8*  GFRNONAA >60  ANIONGAP 7    No results for input(s): "PROT", "ALBUMIN", "AST", "ALT", "ALKPHOS", "BILITOT" in the last 168 hours. Lipids No results for input(s): "CHOL", "TRIG", "HDL", "LABVLDL", "LDLCALC", "CHOLHDL" in the last 168 hours.  Hematology Recent Labs  Lab 10/17/22 0916  WBC 8.7  RBC 5.32  HGB 14.9  HCT 45.6  MCV 85.7  MCH 28.0  MCHC 32.7  RDW 12.6  PLT 228   Thyroid No results for input(s): "TSH", "FREET4" in the last 168 hours.  BNPNo results for input(s): "BNP", "PROBNP" in the last 168 hours.  DDimer No results for input(s): "DDIMER" in the last 168 hours.   Radiology/Studies:  No results found.   Assessment and Plan:   New Onset Atrial Fibrillation with RVR Patient presented for  planned cervical spine surgery and was found to be in new onset atrial fibrillation with rates as high as the 170s. Therefore, procedure was cancelled and Cardiology asked to see. - Currently in atrial fibrillation with rates in  in the 140s to 170s. Completely asymptomatic with this. - Will order Echo. - Will check CBC, BMET, Mag, and TSH. - Will start IV Diltiazem infusion with bolus. - CHA2DS2-VASc  = 2 (HTN and DM). Will start Eliquis 5mg  twice daily.  - Patient is asymptomatic with this so unclear whether he has had this before. However, EKG last week during preadmission testing showed normal sinus rhythm. He may converted to sinus rhythm on the IV Diltiazem. However, if not and unable to rate control, he may need TEE/ DCCV. Will go ahead and make patient NPO and put him on the board just in case. If he undergoes DCCV, spinal surgery will need to be delayed at least 4 weeks.  Hypertension BP well controlled.  - Will start IV Diltiazem as above. - Will hold home Lisinopril to allow for more rate control.  Type 2 Diabetes Mellitus Hemoglobin A1c 9.1% on 10/17/2022.  - On Metformin, Glipizide, and Insulin at home.  - Will hold home medications and place on sliding scale insulin.  Code Status: Full Code (personally discussed with patient).    Risk Assessment/Risk Scores:    CHA2DS2-VASc Score = 2  This indicates a 2.2% annual risk of stroke. The patient's score is based upon: CHF History: 0 HTN History: 1 Diabetes History: 1 Stroke History: 0 Vascular Disease History: 0 Age Score: 0 Gender Score: 0    Severity of Illness: The appropriate patient status for this patient is OBSERVATION. Observation status is judged to be reasonable and necessary in order to provide the required intensity of service to ensure the patient's safety. The patient's presenting symptoms, physical exam findings, and initial radiographic and laboratory data in the context of their medical condition is felt  to place them at decreased risk for further clinical deterioration. Furthermore, it is anticipated that the patient will be medically stable for discharge from the hospital within 2 midnights of admission.      For questions or updates, please contact [facility] Please consult [email or web address] for contact info under    Signed, Corrin Parker, PA-C  10/22/2022 2:34 PM  Personally seen and examined. Agree with above.  62 year old with aborted cervical spine surgery secondary to atrial fibrillation with rapid ventricular response newly diagnosed.  Asymptomatic.  Has diabetes.  Currently comfortable no shortness of breath.  On exam irregularly irregular tachycardic lungs are clear  Lab work unremarkable  Assessment and plan:  Atrial fibrillation with rapid ventricular response paroxysmal -Start Eliquis.  CHA2DS2-VASc 2 diabetes and hypertension - Start IV diltiazem drip, plan for TEE cardioversion tomorrow.  N.p.o. past midnight.  Risk and benefits have been explained including esophageal damage 1 in 10,000.  He is willing to proceed. -She will need to stay on Eliquis for 4 weeks after cardioversion.  At that point if he is maintaining sinus rhythm he can proceed with cervical spine surgery.  Donato Schultz, MD

## 2022-10-22 NOTE — Progress Notes (Signed)
   10/22/22 1716  Vitals  Temp 97.9 F (36.6 C)  Temp Source Oral  BP 126/70  MAP (mmHg) 89  BP Location Right Arm  BP Method Automatic  Patient Position (if appropriate) Lying  Pulse Rate (!) 113  Pulse Rate Source Monitor  ECG Heart Rate (!) 114  Resp 20  MEWS COLOR  MEWS Score Color Yellow  Oxygen Therapy  SpO2 96 %  O2 Device Room Air  Pain Assessment  Pain Scale 0-10  Pain Score 0  Height and Weight  Height 5\' 8"  (1.727 m)  Weight 91.9 kg  Type of Scale Used Bed  Type of Weight Actual  BSA (Calculated - sq m) 2.1 sq meters  BMI (Calculated) 30.8  Weight in (lb) to have BMI = 25 164.1  MEWS Score  MEWS Temp 0  MEWS Systolic 0  MEWS Pulse 2  MEWS RR 0  MEWS LOC 0  MEWS Score 2   Patient admitted on to the unit from [facility] Peri-OP. Patient is a chronic yellow mews due to elevated HR. Patient is asymptomatic, chest pain 0/10. Patient has 15mg  cardizem gtt infusing.

## 2022-10-22 NOTE — Progress Notes (Signed)
Cardiologist at bedside.  

## 2022-10-22 NOTE — Transfer of Care (Signed)
Immediate Anesthesia Transfer of Care Note  Patient: Henry Nguyen  Procedure(s) Performed: C5-6 ANTERIOR CERVICAL DISCECTOMY FUSION, ALLOGRAFT, PLATE  Patient Location: PACU  Anesthesia Type:MAC  Level of Consciousness: awake, alert , and oriented  Airway & Oxygen Therapy: Patient Spontanous Breathing  Post-op Assessment: Report given to RN, Post -op Vital signs reviewed and stable, and Patient moving all extremities X 4  Post vital signs: Reviewed and stable  Last Vitals:  Vitals Value Taken Time  BP 112/88 10/22/22 1315  Temp    Pulse 72 10/22/22 1316  Resp 19 10/22/22 1316  SpO2 100 % 10/22/22 1316  Vitals shown include unvalidated device data.  Last Pain:  Vitals:   10/22/22 1125  TempSrc:   PainSc: 0-No pain         Complications: No notable events documented.

## 2022-10-22 NOTE — Progress Notes (Signed)
Patient resting quietly in bed denies any pain or discomfort skin warm and dry respirations even unlabored

## 2022-10-22 NOTE — Anesthesia Preprocedure Evaluation (Addendum)
Anesthesia Evaluation  Patient identified by MRN, date of birth, ID band Patient awake    Reviewed: Allergy & Precautions, NPO status , Patient's Chart, lab work & pertinent test results  Airway Mallampati: III  TM Distance: >3 FB Neck ROM: Full    Dental no notable dental hx. (+) Teeth Intact, Dental Advisory Given, Missing,    Pulmonary neg pulmonary ROS   Pulmonary exam normal breath sounds clear to auscultation       Cardiovascular hypertension (100/88 preop), Pt. on medications Normal cardiovascular exam Rhythm:Regular Rate:Normal     Neuro/Psych  negative psych ROS   GI/Hepatic Neg liver ROS,GERD  Controlled and Medicated,,  Endo/Other  diabetes, Poorly Controlled, Type 2, Oral Hypoglycemic Agents, Insulin Dependent  A1c 9.1 Obesity BMI 32 FS 178 preop  Renal/GU negative Renal ROS  negative genitourinary   Musculoskeletal  (+) Arthritis , Osteoarthritis,    Abdominal  (+) + obese  Peds  Hematology negative hematology ROS (+) Hb 14.9   Anesthesia Other Findings   Reproductive/Obstetrics negative OB ROS                             Anesthesia Physical Anesthesia Plan  ASA: 3  Anesthesia Plan: General   Post-op Pain Management: Tylenol PO (pre-op)*, Ketamine IV* and Dilaudid IV   Induction: Intravenous  PONV Risk Score and Plan: 2 and Ondansetron, Dexamethasone, Midazolam and Treatment may vary due to age or medical condition  Airway Management Planned: Oral ETT and Video Laryngoscope Planned  Additional Equipment: None  Intra-op Plan:   Post-operative Plan: Extubation in OR  Informed Consent: I have reviewed the patients History and Physical, chart, labs and discussed the procedure including the risks, benefits and alternatives for the proposed anesthesia with the patient or authorized representative who has indicated his/her understanding and acceptance.     Dental  advisory given  Plan Discussed with: CRNA  Anesthesia Plan Comments:        Anesthesia Quick Evaluation

## 2022-10-22 NOTE — Op Note (Addendum)
Patient was taken back to the operating room for planned anterior cervical fusion.  He was transferred from the stretcher to the operating table To the monitors and was awake talking.  Patient went into atrial fibrillation with a ventricular rate 165-170.  He denied chest pain.  Preop EKG EKG showed normal sinus rhythm suggestion of lateral ischemia.  Due to new onset of atrial fibrillation with pulse irregularly irregular surgery was canceled and cardiology will called and can see him in the PACU and to determine what medications or if any additional testing is indicated.  Once patient stabilized from cardiology standpoint he can be rescheduled for the single level cervical fusion at C5-6 in a week or 2 whenever they clear him. I called his Sister who was with him pre-op and discussed. If he is not kept overnight she can be called to pick him up. He can call my office once Cardiology clears him. My cell [phone]

## 2022-10-22 NOTE — Progress Notes (Signed)
Cardiology PA at bedside awaiting new orders

## 2022-10-23 ENCOUNTER — Ambulatory Visit (HOSPITAL_COMMUNITY): Payer: Medicaid Other

## 2022-10-23 ENCOUNTER — Other Ambulatory Visit: Payer: Self-pay | Admitting: Physician Assistant

## 2022-10-23 ENCOUNTER — Other Ambulatory Visit (HOSPITAL_COMMUNITY): Payer: Self-pay

## 2022-10-23 DIAGNOSIS — M4722 Other spondylosis with radiculopathy, cervical region: Secondary | ICD-10-CM | POA: Diagnosis not present

## 2022-10-23 DIAGNOSIS — I480 Paroxysmal atrial fibrillation: Secondary | ICD-10-CM

## 2022-10-23 DIAGNOSIS — N182 Chronic kidney disease, stage 2 (mild): Secondary | ICD-10-CM | POA: Insufficient documentation

## 2022-10-23 DIAGNOSIS — I4891 Unspecified atrial fibrillation: Secondary | ICD-10-CM | POA: Diagnosis not present

## 2022-10-23 DIAGNOSIS — E1165 Type 2 diabetes mellitus with hyperglycemia: Secondary | ICD-10-CM | POA: Insufficient documentation

## 2022-10-23 LAB — BASIC METABOLIC PANEL
Anion gap: 8 (ref 5–15)
BUN: 20 mg/dL (ref 8–23)
CO2: 26 mmol/L (ref 22–32)
Calcium: 7.9 mg/dL — ABNORMAL LOW (ref 8.9–10.3)
Chloride: 100 mmol/L (ref 98–111)
Creatinine, Ser: 1.42 mg/dL — ABNORMAL HIGH (ref 0.61–1.24)
GFR, Estimated: 56 mL/min — ABNORMAL LOW (ref >60)
Glucose, Bld: 439 mg/dL — ABNORMAL HIGH (ref 70–99)
Potassium: 3.8 mmol/L (ref 3.5–5.1)
Sodium: 134 mmol/L — ABNORMAL LOW (ref 135–145)

## 2022-10-23 LAB — HEPATIC FUNCTION PANEL
ALT: 14 U/L (ref 0–44)
AST: 14 U/L — ABNORMAL LOW (ref 15–41)
Albumin: 2.9 g/dL — ABNORMAL LOW (ref 3.5–5.0)
Alkaline Phosphatase: 74 U/L (ref 38–126)
Bilirubin, Direct: <0.1 mg/dL (ref 0.0–0.2)
Total Bilirubin: 0.6 mg/dL (ref 0.3–1.2)
Total Protein: 5.6 g/dL — ABNORMAL LOW (ref 6.5–8.1)

## 2022-10-23 LAB — GLUCOSE, CAPILLARY: Glucose-Capillary: 246 mg/dL — ABNORMAL HIGH (ref 70–99)

## 2022-10-23 SURGERY — ECHOCARDIOGRAM, TRANSESOPHAGEAL
Anesthesia: General

## 2022-10-23 MED ORDER — DILTIAZEM HCL ER COATED BEADS 120 MG PO CP24
120.0000 mg | ORAL_CAPSULE | Freq: Every day | ORAL | Status: DC
  Administered 2022-10-23: 120 mg via ORAL
  Filled 2022-10-23: qty 1

## 2022-10-23 MED ORDER — GLIPIZIDE 10 MG PO TABS
10.0000 mg | ORAL_TABLET | Freq: Every day | ORAL | Status: DC
  Administered 2022-10-23: 10 mg via ORAL
  Filled 2022-10-23: qty 1

## 2022-10-23 MED ORDER — METFORMIN HCL 500 MG PO TABS
500.0000 mg | ORAL_TABLET | Freq: Every day | ORAL | Status: DC
  Administered 2022-10-23: 500 mg via ORAL
  Filled 2022-10-23: qty 1

## 2022-10-23 MED ORDER — LISINOPRIL 5 MG PO TABS: 5.0000 mg | ORAL_TABLET | Freq: Every day | ORAL | 5 refills | Status: DC

## 2022-10-23 MED ORDER — DILTIAZEM HCL ER COATED BEADS 120 MG PO CP24: 120.0000 mg | ORAL_CAPSULE | Freq: Every day | ORAL | 5 refills | Status: DC

## 2022-10-23 MED ORDER — APIXABAN 5 MG PO TABS: 5.0000 mg | ORAL_TABLET | Freq: Two times a day (BID) | ORAL | 5 refills | Status: AC

## 2022-10-23 MED ORDER — LISINOPRIL 5 MG PO TABS
5.0000 mg | ORAL_TABLET | Freq: Every day | ORAL | Status: DC
  Administered 2022-10-23: 5 mg via ORAL
  Filled 2022-10-23: qty 1

## 2022-10-23 NOTE — Progress Notes (Signed)
2d echo per Dr. Anne Fu.

## 2022-10-23 NOTE — Progress Notes (Signed)
Pt discharge instructions given with teach back on new medications IV, Tele removed.

## 2022-10-23 NOTE — Plan of Care (Signed)

## 2022-10-23 NOTE — Progress Notes (Signed)
Rounding Note    Patient Name: Henry Nguyen Date of Encounter: 10/23/2022  [facility] Cardiologist: None Henry Nguyen  Subjective   Feeling well auto converted no chest pain no shortness of breath.  He takes care of his mother who is bedbound.  He does admit that he eats candy quite a bit especially when the grandchildren are over.  Inpatient Medications    Scheduled Meds:  apixaban  5 mg Oral BID   insulin aspart  0-15 Units Subcutaneous TID WC   pantoprazole  40 mg Oral Daily   pneumococcal 20-valent conjugate vaccine  0.5 mL Intramuscular Tomorrow-1000   Continuous Infusions:  diltiazem (CARDIZEM) infusion 7.5 mg/hr (10/23/22 0132)   PRN Meds: acetaminophen, albuterol, nitroGLYCERIN   Vital Signs    Vitals:   10/23/22 0134 10/23/22 0200 10/23/22 0321 10/23/22 0437  BP: 105/68 (!) 102/52 124/69 122/75  Pulse:    71  Resp: 15 20 (!) 21 20  Temp:    97.9 F (36.6 C)  TempSrc:    Oral  SpO2:    98%  Weight:    92.3 kg  Height:        Intake/Output Summary (Last 24 hours) at 10/23/2022 0807 Last data filed at 10/23/2022 0446 Gross per 24 hour  Intake 623.32 ml  Output 1675 ml  Net -1051.68 ml      10/23/2022    4:37 AM 10/22/2022    5:16 PM 10/22/2022   10:59 AM  Last 3 Weights  Weight (lbs) 203 lb 8 oz 202 lb 8 oz 210 lb  Weight (kg) 92.307 kg 91.853 kg 95.255 kg      Telemetry    Normal sinus rhythm- Personally Reviewed  ECG    Normal sinus rhythm- Personally Reviewed  Physical Exam   GEN: No acute distress.   Neck: No JVD Cardiac: RRR, no murmurs, rubs, or gallops.  Respiratory: Clear to auscultation bilaterally. GI: Soft, nontender, non-distended  MS: No edema; No deformity. Neuro:  Nonfocal  Psych: Normal affect   Labs    High Sensitivity Troponin:  No results for input(s): "TROPONINIHS" in the last 720 hours.   Chemistry Recent Labs  Lab 10/17/22 0916 10/22/22 1448 10/23/22 0055  NA 137 138 134*  K 3.7 3.5 3.8  CL 101 103  100  CO2 29 27 26   GLUCOSE 163* 171* 439*  BUN 13 19 20   CREATININE 1.12 1.23 1.42*  CALCIUM 8.8* 8.7* 7.9*  MG  --  1.8  --   GFRNONAA >60 >60 56*  ANIONGAP 7 8 8     Lipids No results for input(s): "CHOL", "TRIG", "HDL", "LABVLDL", "LDLCALC", "CHOLHDL" in the last 168 hours.  Hematology Recent Labs  Lab 10/17/22 0916 10/22/22 1448  WBC 8.7 10.3  RBC 5.32 5.52  HGB 14.9 15.3  HCT 45.6 47.6  MCV 85.7 86.2  MCH 28.0 27.7  MCHC 32.7 32.1  RDW 12.6 12.9  PLT 228 226   Thyroid  Recent Labs  Lab 10/22/22 1448  TSH 1.250    BNPNo results for input(s): "BNP", "PROBNP" in the last 168 hours.  DDimer No results for input(s): "DDIMER" in the last 168 hours.   Radiology    No results found.  Cardiac Studies   ECHO Pending (OK to complete as outpatient)  Patient Profile     62 y.o. male with paroxysmal atrial fibrillation surrounding cancellation of cervical spine surgery yesterday.  Surgery was not completed.  This is first onset of atrial fibrillation.  Assessment & Plan    Paroxysmal atrial fibrillation - Patient auto converted, diltiazem drip. -Continue Eliquis 5 mg twice a day.  Canceled TEE cardioversion, relayed to Angie Duke earlier this morning. -We will prepare for discharge. -Diltiazem CD 120 mg once a day will be administered. -If echocardiogram does not get completed here, we will set him up for echocardiogram as outpatient. -He needs to continue his Eliquis for at least 4 weeks prior to rescheduling his cervical spine surgery.  Diabetes with hypertension - Okay to continue lisinopril, renal protection.  A1c 9.1 consider Jardiance  OK for DC.   Follow up in one month in AFIB clinic.   Donato Schultz, MD   For questions or updates, please contact [facility] Please consult [email or web address] for contact info under        Signed, Donato Schultz, MD  10/23/2022, 8:07 AM

## 2022-10-23 NOTE — TOC Transition Note (Signed)
Transition of Care [facility]) - CM/SW Discharge Note   Patient Details  Name: Henry Nguyen MRN: [id number] Date of Birth: 10/23/1960  Transition of Care [facility]) CM/SW Contact:  Leone Haven, RN Phone Number: 10/23/2022, 10:15 AM   Clinical Narrative:    From home with Mom, Indep, he states his sister, [name] will transport him home today, he is here with afib RVR, still on cardizem drip which will be stopped prior to dc home.  He will be on Eliquis,  NCM gave patient the 30 day coupon card and he has Medicaid , refills will be 4.00.           Patient Goals and CMS Choice      Discharge Placement                         Discharge Plan and Services Additional resources added to the After Visit Summary for                                       Social Determinants of Health (SDOH) Interventions SDOH Screenings   Food Insecurity: No Food Insecurity (10/22/2022)  Housing: Low Risk  (10/22/2022)  Transportation Needs: No Transportation Needs (10/22/2022)  Utilities: Not At Risk (10/22/2022)  Tobacco Use: Low Risk  (10/22/2022)     Readmission Risk Interventions     No data to display

## 2022-10-23 NOTE — Discharge Summary (Addendum)
Discharge Summary    Patient ID: Henry Nguyen MRN: [id number]; DOB: Dec 24, 1960  Admit date: 10/22/2022 Discharge date: 10/23/2022  PCP:  Waldon Reining, MD   [facility] Providers Cardiologist:  Donato Schultz, MD        Discharge Diagnoses    Principal Problem:   Atrial fibrillation with RVR [facility]) Active Problems:   Hypertension   AKI (acute kidney injury) (HCC)   Diabetes mellitus with hyperglycemia (HCC)   Chronic kidney disease, stage 2 (mild)    Diagnostic Studies/Procedures    Pending OP Echocardiogram _____________   History of Present Illness     Henry Nguyen is a 62 y.o. male with hypertension, type 2 diabetes mellitus, GERD, arthritis, cervical spinal stenosis, and no prior cardiac history who presented to [facility] 5/1 for planned cervical spine fusion. However, he was noted to be in new onset atrial fibrillation with RVR on arrival so procedure was cancelled. EKG on 10/17/2022 during pre-admission testing showed normal sinus rhythm. EKG upon evaluation 5/1 showed atrial fibrillation, rate 155 bpm, with LVH and no acute ST/T changes. Cardiology consulted for further evaluation. He was unaware of any palpitations. He denied any recent chest pain or significant SOB. He stated he would sometimes use a family member's inhaler when he is outside weed-eating but no other shortness of breath. He denied h/o tobacco abuse. He reported drinking 1-2 beers on the weekend but no excessive alcohol use. He was started on IV diltiazem drip and Eliquis and admitted to the cardiology service.  Hospital Course     New Onset Atrial Fibrillation with RVR - Initially set up for potential TEE/DCCV but converted to NSR overnight on diltiazem drip - Transitioned to oral diltiazem CD 120mg  daily this AM - Will be discharged on Eliquis 5mg  BID - Plan for outpatient echocardiogram, scheduled 5/9 - TSH wnl - Per Dr. Anne Fu, he needs to continue his Eliquis for at  least 4 weeks prior to rescheduling his cervical spine surgery -> need to re-evaluate as outpatient at follow-up, scheduled for Afib clinic in 1 month (copy of note routed to surgeon Dr. Ophelia Charter) - advised to dc ASA as well as Body and Back Extra Strength (contains ASA) given new Eliquis  Hypertension - Lisinopril held on admission to allow for more BP room, variable down to nadir 94/44 overnight, max 138/95, up to 122/75 this AM - Will discharge on oral diltiazem as above - Per Dr. Anne Fu, decrease lisinopril to 5mg  once daily - Patient advised to monitor BP at home and notify if running >130/80  Type 2 Diabetes Mellitus - Hemoglobin A1c 9.1% on 10/17/2022.  - On Metformin, Glipizide, and Insulin at home, held on admission and received SSI instead - CBG 439 this AM so per Dr. 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 Anne Fu has seen and examined the patient today and feels he is stable for discharge. Per Dr. Anne Fu' recommendation, f/u in the Afib clinic has been arranged for 1 month.   We also removed gabapentin, ibuprofen, methocarbamol, prednisone, and tizanidine medicine list since he indicated he no longer takes these.  Did the patient have an acute coronary syndrome (MI, NSTEMI, STEMI, etc) this admission?:  No                               Did the patient have a percutaneous coronary intervention (stent / angioplasty)?:  No.          _____________  Discharge Vitals Blood pressure 122/75, pulse 71, temperature 97.9 F (36.6 C), temperature source Oral, resp. rate 20, height 5\' 8"  (1.727 m), weight 92.3 kg,  SpO2 98 %.  Filed Weights   10/22/22 1059 10/22/22 1716 10/23/22 0437  Weight: 95.3 kg 91.9 kg 92.3 kg    Labs & Radiologic Studies    CBC Recent Labs    10/22/22 1448  WBC 10.3  HGB 15.3  HCT 47.6  MCV 86.2  PLT 226   Basic Metabolic Panel Recent Labs    16/10/96 1448 10/23/22 0055  NA 138 134*  K 3.5 3.8  CL 103 100  CO2 27 26  GLUCOSE 171* 439*  BUN 19 20  CREATININE 1.23 1.42*  CALCIUM 8.7* 7.9*  MG 1.8  --    Thyroid Function Tests Recent Labs    10/22/22 1448  TSH 1.250   _____________  No results found. Disposition   Pt is being discharged home today in good condition.  Follow-up Plans & Appointments     Follow-up Information     [facility] Follow up.   Specialty: Cardiology Why: Humberto Seals - [address] location - heart ultrasound scheduled on Thursday Oct 30, 2022 10:35 AM (Arrive by 10:20 AM) Contact information: [address] [id number] mc [place] [address] [phone]        [facility] Follow up.   Specialty: Cardiology Why: Follow-up appointment scheduled with Mr. Erle Crocker at the [facility]illation Clinic at [facility] (6th floor) on Tuesday Nov 25, 2022 at 9:30 AM. Please arrive at 9:15 AM to check in. Contact information: [address] [id number] mc [address] [place] [address] [phone]        Waldon Reining, MD Follow up.   Specialties: Family Medicine, Sports Medicine Why: Your blood sugar numbers look to be uncontrolled - A1c 9.1. Please follow up with your PCP within 1 week to discuss management of your diabetes. We would also recommend recheck of your kidney function at that time. Contact information: [address] [place] [address] [phone]                Discharge Instructions     Diet - low sodium heart healthy   Complete by: As directed     Discharge instructions   Complete by: As directed    You were started on a new medicine called diltiazem to help with your heart rhythm.  Because you were started on a new blood thinner called Eliquis (apixaban), we recommend to stop your aspirin as well as the Back & Body Extra Strength medicine you take because this also has additional aspirin in it. Patients taking blood thinners should also generally stay away from medicines like ibuprofen, Advil, Motrin, naproxen, and Aleve due to risk of stomach bleeding. You may take Tylenol as directed or talk to your primary doctor about alternatives.  Your lisinopril dose was lowered (new prescription sent in) to allow more blood pressure room for your new diltiazem. Please  monitor your blood pressure occasionally at home. Call your doctor if you tend to get readings of greater than 130 on the top number or 80 on the bottom number.  We removed gabapentin, ibuprofen, methocarbamol, prednisone, and tizanidine from your medicine list since you no longer take these.   Increase activity slowly   Complete by: As directed         Discharge Medications   Allergies as of 10/23/2022   No Known Allergies      Medication List     STOP taking these medications    aspirin EC 81 MG tablet   Back & Body Extra Strength 500-32.5 MG Tabs Generic drug: Aspirin-Caffeine   gabapentin 300 MG capsule Commonly known as: NEURONTIN   ibuprofen 800 MG tablet Commonly known as: ADVIL   methocarbamol 750 MG tablet Commonly known as: ROBAXIN   predniSONE 5 MG (21) Tbpk tablet Commonly known as: STERAPRED UNI-PAK 21 TAB   tiZANidine 4 MG tablet Commonly known as: Zanaflex       TAKE these medications    albuterol 108 (90 Base) MCG/ACT inhaler Commonly known as: VENTOLIN HFA Inhale 1-2 puffs into the lungs every 6 (six) hours as needed for wheezing or shortness of breath.   apixaban 5 MG Tabs tablet Commonly known as: ELIQUIS Take 1 tablet (5 mg  total) by mouth 2 (two) times daily.   diltiazem 120 MG 24 hr capsule Commonly known as: CARDIZEM CD Take 1 capsule (120 mg total) by mouth daily.   glipiZIDE 10 MG tablet Commonly known as: GLUCOTROL Take 10 mg by mouth daily before breakfast.   Lantus SoloStar 100 UNIT/ML Solostar Pen Generic drug: insulin glargine Inject 46 Units into the skin at bedtime.   lisinopril 5 MG tablet Commonly known as: ZESTRIL Take 1 tablet (5 mg total) by mouth daily. What changed:  medication strength how much to take   metFORMIN 500 MG tablet Commonly known as: GLUCOPHAGE Take 500 mg by mouth daily with breakfast.   omeprazole 20 MG capsule Commonly known as: PRILOSEC Take 20 mg by mouth daily before breakfast.           Outstanding Labs/Studies   N/A  Duration of Discharge Encounter   Greater than 30 minutes including physician time.  Signed, Laurann Montana, PA-C 10/23/2022, 9:57 AM  Personally seen and examined. Agree with above.  Feeling  well auto converted no chest pain no shortness of breath.  He takes care of his mother who is bedbound.  He does admit that he eats candy quite a bit especially when the grandchildren are over.   Inpatient Medications    Scheduled Meds:  apixaban  5 mg Oral BID   insulin aspart  0-15 Units Subcutaneous TID WC   pantoprazole  40 mg Oral Daily   pneumococcal 20-valent conjugate vaccine  0.5 mL Intramuscular Tomorrow-1000    Continuous Infusions:  diltiazem (CARDIZEM) infusion 7.5 mg/hr (10/23/22 0132)    PRN Meds: acetaminophen, albuterol, nitroGLYCERIN    Vital Signs          Vitals:    10/23/22 0134 10/23/22 0200 10/23/22 0321 10/23/22 0437  BP: 105/68 (!) 102/52 124/69 122/75  Pulse:       71  Resp: 15 20 (!) 21 20  Temp:       97.9 F (36.6 C)  TempSrc:       Oral  SpO2:       98%  Weight:  92.3 kg  Height:              Intake/Output Summary (Last 24 hours) at 10/23/2022 0807 Last data filed at 10/23/2022 0446     Gross per 24 hour  Intake 623.32 ml  Output 1675 ml  Net -1051.68 ml        10/23/2022    4:37 AM 10/22/2022    5:16 PM 10/22/2022   10:59 AM  Last 3 Weights  Weight (lbs) 203 lb 8 oz 202 lb 8 oz 210 lb  Weight (kg) 92.307 kg 91.853 kg 95.255 kg       Telemetry    Normal sinus rhythm- Personally Reviewed   ECG    Normal sinus rhythm- Personally Reviewed   Physical Exam    GEN: No acute distress.   Neck: No JVD Cardiac: RRR, no murmurs, rubs, or gallops.  Respiratory: Clear to auscultation bilaterally. GI: Soft, nontender, non-distended  MS: No edema; No deformity. Neuro:  Nonfocal  Psych: Normal affect    Labs    High Sensitivity Troponin:   Last Labs  No results for input(s): "TROPONINIHS" in the last 720 hours.     Chemistry Last Labs       Recent Labs  Lab 10/17/22 0916 10/22/22 1448 10/23/22 0055  NA 137 138 134*  K 3.7 3.5 3.8  CL 101 103 100  CO2 29 27 26   GLUCOSE 163* 171* 439*  BUN 13 19 20   CREATININE 1.12 1.23 1.42*  CALCIUM 8.8* 8.7* 7.9*  MG  --  1.8  --   GFRNONAA >60 >60 56*  ANIONGAP 7 8 8       Lipids  Last Labs  No results for input(s): "CHOL", "TRIG", "HDL", "LABVLDL", "LDLCALC", "CHOLHDL" in the last 168 hours.    Hematology Last Labs      Recent Labs  Lab 10/17/22 0916 10/22/22 1448  WBC 8.7 10.3  RBC 5.32 5.52  HGB 14.9 15.3  HCT 45.6 47.6  MCV 85.7 86.2  MCH 28.0 27.7  MCHC 32.7 32.1  RDW 12.6 12.9  PLT 228 226      Thyroid  Last Labs     Recent Labs  Lab 10/22/22 1448  TSH 1.250      BNP Last Labs  No results for input(s): "BNP", "PROBNP" in the last 168 hours.    DDimer  Last Labs  No results for input(s): "DDIMER" in the last 168 hours.      Radiology    Imaging Results (Last 48 hours)  No results found.     Cardiac Studies    ECHO Pending (OK to complete as outpatient)   Patient Profile     62 y.o. male with paroxysmal atrial fibrillation surrounding cancellation of cervical spine  surgery yesterday.  Surgery was not completed.  This is first onset of atrial fibrillation.   Assessment & Plan    Paroxysmal atrial fibrillation - Patient auto converted, diltiazem drip. -Continue Eliquis 5 mg twice a day.  Canceled TEE cardioversion, relayed to Angie Duke earlier this morning. -We will prepare for discharge. -Diltiazem CD 120 mg once a day will be administered. -If echocardiogram does not get completed here, we will set him up for echocardiogram as outpatient. -He needs to continue his Eliquis for at least 4 weeks prior to rescheduling his cervical spine surgery.   Diabetes with hypertension - Okay to continue lisinopril at 5mg  (decreased from 20 dur to BP and addition of dilt 120 cd), renal protection.  A1c 9.1 consider Jardiance   OK for DC.    Follow up in one month in AFIB clinic.    Donato Schultz, MD

## 2022-10-23 NOTE — Progress Notes (Signed)
Pt in afib at start of shift, HR fluctuating 100s-140s with diltiazem drip infusing at 15mg /hr. Pt asymptomatic.   Converted to sinus rhythm around 2110, HR 60s-80s. EKG obtained and MD notified.  Diltiazem slowly titrated down to 7.5mg /hr. BP stable.   Around 0445, ambulated back and forth in room and stood brushing teeth at sink. HR 80s, sinus rhythm. Denies dizziness. Diltiazem still infusing at 7.5mg /hr.

## 2022-10-29 ENCOUNTER — Encounter: Payer: Medicaid Other | Admitting: Orthopaedic Surgery

## 2022-10-30 ENCOUNTER — Ambulatory Visit (HOSPITAL_COMMUNITY): Payer: Medicaid Other | Attending: Cardiology

## 2022-10-30 DIAGNOSIS — I480 Paroxysmal atrial fibrillation: Secondary | ICD-10-CM | POA: Insufficient documentation

## 2022-10-30 LAB — ECHOCARDIOGRAM COMPLETE
Area-P 1/2: 3.26 cm2
S' Lateral: 3.85 cm

## 2022-10-31 ENCOUNTER — Telehealth: Payer: Self-pay | Admitting: Cardiology

## 2022-10-31 DIAGNOSIS — I519 Heart disease, unspecified: Secondary | ICD-10-CM

## 2022-10-31 NOTE — Telephone Encounter (Signed)
Patient states he is returning call in regards to results. Requesting return call.

## 2022-10-31 NOTE — Telephone Encounter (Signed)
Pt is aware of results and that he will be contacted to be scheduled for limited echo with definity.

## 2022-10-31 NOTE — Telephone Encounter (Signed)
  [name], MD 10/31/2022  7:04 AM EDT     Please order ECHO limited with Definity Henry Schultz, MD  FINDINGS   Left Ventricle: LVEF appears mildly depreessed Would recomm limited echo  with [facility] to confirm wall motion and LVEF. The left ventricle has  normal function. The left ventricular internal cavity size was normal in  size. There is no left ventricular  hypertrophy. Left ventricular diastolic parameters are consistent with  Grade I diastolic dysfunction (impaired relaxation). Elevated left atrial  pressure.

## 2022-11-06 ENCOUNTER — Ambulatory Visit (HOSPITAL_COMMUNITY): Payer: Medicaid Other | Attending: Cardiology

## 2022-11-06 DIAGNOSIS — I5189 Other ill-defined heart diseases: Secondary | ICD-10-CM | POA: Diagnosis not present

## 2022-11-06 DIAGNOSIS — I519 Heart disease, unspecified: Secondary | ICD-10-CM | POA: Insufficient documentation

## 2022-11-06 LAB — ECHOCARDIOGRAM LIMITED
Area-P 1/2: 2.92 cm2
S' Lateral: 3.5 cm

## 2022-11-06 MED ORDER — PERFLUTREN LIPID MICROSPHERE
3.0000 mL | INTRAVENOUS | Status: AC | PRN
Start: 2022-11-06 — End: 2022-11-06
  Administered 2022-11-06: 3 mL via INTRAVENOUS

## 2022-11-11 ENCOUNTER — Telehealth: Payer: Self-pay | Admitting: Cardiology

## 2022-11-11 MED ORDER — METOPROLOL SUCCINATE ER 50 MG PO TB24: 50.0000 mg | ORAL_TABLET | Freq: Every day | ORAL | 3 refills | Status: DC

## 2022-11-11 MED ORDER — LOSARTAN POTASSIUM 25 MG PO TABS: 25.0000 mg | ORAL_TABLET | Freq: Every day | ORAL | 3 refills | Status: DC

## 2022-11-11 NOTE — Telephone Encounter (Signed)
Patient is calling requesting his Echo results. Please advise.

## 2022-11-11 NOTE — Telephone Encounter (Signed)
EF 45%  Stop diltiazem 120  Start Toprol XL 50mg  once a day  Stop lisinopril 5mg   Start losartan 25mg  PO QD    Reviewed results with pt who states understanding.  New rxs to be sent into [facility] at pt's request.  He had not further questions at the time of  the call.

## 2022-11-25 ENCOUNTER — Inpatient Hospital Stay (HOSPITAL_COMMUNITY)
Admission: RE | Admit: 2022-11-25 | Discharge: 2022-11-25 | Disposition: A | Discharge status: Home / Self Care | Payer: Medicaid Other | Source: Ambulatory Visit | Attending: Internal Medicine | Admitting: Internal Medicine

## 2022-11-25 ENCOUNTER — Ambulatory Visit (HOSPITAL_COMMUNITY)
Admission: RE | Admit: 2022-11-25 | Discharge: 2022-11-25 | Disposition: A | Discharge status: Home / Self Care | Payer: Medicaid Other | Source: Ambulatory Visit | Attending: Internal Medicine | Admitting: Internal Medicine

## 2022-11-25 VITALS — BP 152/86 | HR 56 | Ht 68.0 in | Wt 206.8 lb

## 2022-11-25 DIAGNOSIS — I480 Paroxysmal atrial fibrillation: Secondary | ICD-10-CM | POA: Diagnosis not present

## 2022-11-25 DIAGNOSIS — N1831 Chronic kidney disease, stage 3a: Secondary | ICD-10-CM | POA: Insufficient documentation

## 2022-11-25 DIAGNOSIS — Z7901 Long term (current) use of anticoagulants: Secondary | ICD-10-CM | POA: Diagnosis not present

## 2022-11-25 DIAGNOSIS — E669 Obesity, unspecified: Secondary | ICD-10-CM | POA: Diagnosis not present

## 2022-11-25 DIAGNOSIS — D6869 Other thrombophilia: Secondary | ICD-10-CM | POA: Diagnosis not present

## 2022-11-25 DIAGNOSIS — I4891 Unspecified atrial fibrillation: Secondary | ICD-10-CM

## 2022-11-25 DIAGNOSIS — Z79899 Other long term (current) drug therapy: Secondary | ICD-10-CM | POA: Diagnosis not present

## 2022-11-25 DIAGNOSIS — Z833 Family history of diabetes mellitus: Secondary | ICD-10-CM | POA: Insufficient documentation

## 2022-11-25 DIAGNOSIS — I129 Hypertensive chronic kidney disease with stage 1 through stage 4 chronic kidney disease, or unspecified chronic kidney disease: Secondary | ICD-10-CM | POA: Insufficient documentation

## 2022-11-25 DIAGNOSIS — Z7984 Long term (current) use of oral hypoglycemic drugs: Secondary | ICD-10-CM | POA: Diagnosis not present

## 2022-11-25 DIAGNOSIS — K219 Gastro-esophageal reflux disease without esophagitis: Secondary | ICD-10-CM | POA: Insufficient documentation

## 2022-11-25 DIAGNOSIS — Z8249 Family history of ischemic heart disease and other diseases of the circulatory system: Secondary | ICD-10-CM | POA: Insufficient documentation

## 2022-11-25 DIAGNOSIS — Z794 Long term (current) use of insulin: Secondary | ICD-10-CM | POA: Insufficient documentation

## 2022-11-25 DIAGNOSIS — R0683 Snoring: Secondary | ICD-10-CM | POA: Insufficient documentation

## 2022-11-25 DIAGNOSIS — Z6831 Body mass index (BMI) 31.0-31.9, adult: Secondary | ICD-10-CM | POA: Diagnosis not present

## 2022-11-25 DIAGNOSIS — E1122 Type 2 diabetes mellitus with diabetic chronic kidney disease: Secondary | ICD-10-CM | POA: Diagnosis not present

## 2022-11-25 LAB — CBC
HCT: 43 % (ref 39.0–52.0)
Hemoglobin: 14 g/dL (ref 13.0–17.0)
MCH: 28.5 pg (ref 26.0–34.0)
MCHC: 32.6 g/dL (ref 30.0–36.0)
MCV: 87.4 fL (ref 80.0–100.0)
Platelets: 187 10*3/uL (ref 150–400)
RBC: 4.92 MIL/uL (ref 4.22–5.81)
RDW: 13.2 % (ref 11.5–15.5)
WBC: 6.5 10*3/uL (ref 4.0–10.5)
nRBC: 0 % (ref 0.0–0.2)

## 2022-11-25 MED ORDER — LOSARTAN POTASSIUM 50 MG PO TABS: 50.0000 mg | ORAL_TABLET | Freq: Every day | ORAL | 3 refills | Status: AC

## 2022-11-25 NOTE — Progress Notes (Signed)
Primary Care Physician: Waldon Reining, MD Primary Cardiologist: Dr. Anne Fu Primary Electrophysiologist: None Referring Physician: Dr. Donette Larry Henry Nguyen is a 62 y.o. male with a history of HTN, T2DM, GERD, CKD stage 2-3a, and paroxysmal atrial fibrillation who presents for consultation in the [facility]. The patient was scheduled for cervical spine fusion and ECG on 4/26 for pre-admission testing showed Afib with RVR. He does not appear to have cardiac awareness of his Afib. He was admitted 5/1-2 and discharged on Eliquis 5 mg BID and diltiazem CD 120 mg daily. Echo showed EF 45% so diltiazem and lisinopril stopped. Patient started on Toprol 50 mg daily and losartan 25 mg daily. Patient is on Eliquis 5 mg BID for a CHADS2VASC score of 2.  On evaluation today, he is currently in NSR. He did not have cardiac awareness of his Afib. He does not have a smart phone but has a BP cuff at home. He feels well overall.  He is compliant with anticoagulation and has not missed any doses. He has no bleeding concerns. He has a few drinks of alcohol occasionally but denies excess alcohol intake. He does snore per nieces at home.   Today, he denies symptoms of palpitations, chest pain, shortness of breath, orthopnea, PND, lower extremity edema, dizziness, presyncope, syncope, snoring, daytime somnolence, bleeding, or neurologic sequela. The patient is tolerating medications without difficulties and is otherwise without complaint today.   Atrial Fibrillation Risk Factors:  he does have symptoms or diagnosis of sleep apnea. he does not have a history of rheumatic fever. he does have a history of alcohol use. The patient does not have a history of early familial atrial fibrillation or other arrhythmias.  he has a BMI of Body mass index is 31.44 kg/m.Marland Kitchen Filed Weights   11/25/22 0942  Weight: 93.8 kg    Family History  Problem Relation Age of Onset   Heart attack  Mother    CAD Mother    Diabetes Sister    Hypertension Sister    Colon cancer Neg Hx    Colon polyps Neg Hx     Atrial Fibrillation Management history:  Previous antiarrhythmic drugs: None Previous cardioversions: None Previous ablations: None Anticoagulation history: Eliquis 5 mg BID   Past Medical History:  Diagnosis Date   Arthritis    Diabetes mellitus without complication (HCC)    GERD (gastroesophageal reflux disease)    Hypertension    Past Surgical History:  Procedure Laterality Date   COLONOSCOPY  2010   COLONOSCOPY N/A 07/09/2018   Procedure: COLONOSCOPY;  Surgeon: West Bali, MD;  [facility];  Service: Endoscopy;  Laterality: N/A;  11:15   ESOPHAGOGASTRODUODENOSCOPY  2010   KNEE ARTHROSCOPY WITH MEDIAL MENISECTOMY Right 03/01/2021   Procedure: KNEE ARTHROSCOPY WITH MEDIAL MENISECTOMY;  Surgeon: Vickki Hearing, MD;  Location: [facility];  Service: Orthopedics;  Laterality: Right;   KNEE ARTHROSCOPY WITH MEDIAL MENISECTOMY Left 10/18/2021   Procedure: KNEE ARTHROSCOPY WITH PARTIAL MEDIAL MENISECTOMY TEAR;  Surgeon: Vickki Hearing, MD;  Location: [facility];  Service: Orthopedics;  Laterality: Left;   POLYPECTOMY  07/09/2018   Procedure: POLYPECTOMY;  Surgeon: West Bali, MD;  [facility];  Service: Endoscopy;;    Current Outpatient Medications  Medication Sig Dispense Refill   albuterol (VENTOLIN HFA) 108 (90 Base) MCG/ACT inhaler Inhale 1-2 puffs into the lungs every 6 (six) hours as needed for wheezing or shortness of breath.  apixaban (ELIQUIS) 5 MG TABS tablet Take 1 tablet (5 mg total) by mouth 2 (two) times daily. 60 tablet 5   glipiZIDE (GLUCOTROL) 10 MG tablet Take 10 mg by mouth daily before breakfast.     insulin glargine (LANTUS SOLOSTAR) 100 UNIT/ML Solostar Pen Inject 46 Units into the skin at bedtime.     metFORMIN (GLUCOPHAGE) 500 MG tablet Take 500 mg by mouth daily with breakfast.   5   metoprolol succinate  (TOPROL-XL) 50 MG 24 hr tablet Take 1 tablet (50 mg total) by mouth daily. Take with or immediately following a meal. 90 tablet 3   omeprazole (PRILOSEC) 20 MG capsule Take 20 mg by mouth daily before breakfast.   5   losartan (COZAAR) 50 MG tablet Take 1 tablet (50 mg total) by mouth daily. 30 tablet 3   No current facility-administered medications for this encounter.    No Known Allergies  Social History   Socioeconomic History   Marital status: Divorced    Spouse name: Not on file   Number of children: 0   Years of education: Not on file   Highest education level: Not on file  Occupational History   Not on file  Tobacco Use   Smoking status: Never   Smokeless tobacco: Never  Vaping Use   Vaping Use: Never used  Substance and Sexual Activity   Alcohol use: Yes    Alcohol/week: 3.0 standard drinks of alcohol    Types: 3 Cans of beer per week   Drug use: Not Currently   Sexual activity: Yes  Other Topics Concern   Not on file  Social History Narrative   Working a [employer]. Single. No kids.   Social Determinants of Health   Financial Resource Strain: Not on file  Food Insecurity: No Food Insecurity (10/22/2022)   Hunger Vital Sign    Worried About Running Out of Food in the Last Year: Never true    Ran Out of Food in the Last Year: Never true  Transportation Needs: No Transportation Needs (10/22/2022)   PRAPARE - Administrator, Civil Service (Medical): No    Lack of Transportation (Non-Medical): No  Physical Activity: Not on file  Stress: Not on file  Social Connections: Not on file  Intimate Partner Violence: Not At Risk (10/22/2022)   Humiliation, Afraid, Rape, and Kick questionnaire    Fear of Current or Ex-Partner: No    Emotionally Abused: No    Physically Abused: No    Sexually Abused: No    ROS- All systems are reviewed and negative except as per the HPI above.  Physical Exam: Vitals:   11/25/22 0942  BP: (!) 152/86  Pulse: (!) 56   Weight: 93.8 kg  Height: 5\' 8"  (1.727 m)    GEN- The patient is a well appearing male, alert and oriented x 3 today.   Head- normocephalic, atraumatic Eyes-  Sclera clear, conjunctiva pink Ears- hearing intact Oropharynx- clear Neck- supple  Lungs- Clear to ausculation bilaterally, normal work of breathing Heart- Regular rate and rhythm, no murmurs, rubs or gallops  GI- soft, NT, ND, + BS Extremities- no clubbing, cyanosis, or edema MS- no significant deformity or atrophy Skin- no rash or lesion Psych- euthymic mood, full affect Neuro- strength and sensation are intact  Wt Readings from Last 3 Encounters:  11/25/22 93.8 kg  10/23/22 92.3 kg  10/17/22 94.1 kg    EKG today demonstrates  Vent. rate 56 BPM PR interval 154  ms QRS duration 76 ms QT/QTcB 416/401 ms P-R-T axes 55 -9 -30 Sinus bradycardia Cannot rule out Anterior infarct , age undetermined ST & T wave abnormality, consider lateral ischemia Abnormal ECG When compared with ECG of 22-Oct-2022 21:46, PREVIOUS ECG IS PRESENT  Echo 11/06/22 demonstrated: 1. Left ventricular ejection fraction, by estimation, is 40 to 45%. The  left ventricle has mildly decreased function. The left ventricle  demonstrates global hypokinesis. There is mild left ventricular  hypertrophy. Left ventricular diastolic parameters  are consistent with Grade I diastolic dysfunction (impaired relaxation).   2. Right ventricular systolic function is normal. The right ventricular  size is normal.   3. The mitral valve is normal in structure. Trivial mitral valve  regurgitation. No evidence of mitral stenosis.   4. The aortic valve is normal in structure. Aortic valve regurgitation is  not visualized.   Epic records are reviewed at length today.  CHA2DS2-VASc Score = 2  The patient's score is based upon: CHF History: 0 HTN History: 1 Diabetes History: 1 Stroke History: 0 Vascular Disease History: 0 Age Score: 0 Gender Score: 0        ASSESSMENT AND PLAN: Paroxysmal Atrial Fibrillation (ICD10:  I48.0) The patient's CHA2DS2-VASc score is 2, indicating a 2.2% annual risk of stroke.    He is currently in NSR.  Education provided about Afib with visual diagram. Discussion about medication treatments and ablation in detail. After discussion, we will proceed with conservative observation at this time.  Will place cardiac monitor to assess Afib burden.   2. Secondary Hypercoagulable State (ICD10:  D68.69) The patient is at significant risk for stroke/thromboembolism based upon his CHA2DS2-VASc Score of 2.  Continue Apixaban (Eliquis).  No missed doses  3. Obesity Body mass index is 31.44 kg/m. Lifestyle modification was discussed at length including regular exercise and weight reduction. Recommended daily walking  4. Snoring with concern for Obstructive sleep apnea He defers sleep study at this time.  5. HTN Increase losartan to 50 mg daily   Follow up in 2 months to discuss monitor results.    Lake Bells, PA-C Afib Clinic [facility] [address] [place], [place] [address] [phone] 11/25/2022 12:01 PM

## 2022-11-25 NOTE — Patient Instructions (Signed)
Increase losartan 50mg  once a day

## 2022-12-08 ENCOUNTER — Inpatient Hospital Stay (HOSPITAL_COMMUNITY)
Admission: RE | Admit: 2022-12-08 | Discharge: 2022-12-08 | Disposition: A | Discharge status: Home / Self Care | Payer: Medicaid Other | Source: Ambulatory Visit | Attending: Internal Medicine | Admitting: Internal Medicine

## 2022-12-08 ENCOUNTER — Other Ambulatory Visit (HOSPITAL_COMMUNITY): Payer: Self-pay | Admitting: *Deleted

## 2022-12-08 DIAGNOSIS — I480 Paroxysmal atrial fibrillation: Secondary | ICD-10-CM

## 2023-01-06 NOTE — Addendum Note (Signed)
Encounter addended by: Shona Simpson, RN on: 01/06/2023 9:04 AM  Actions taken: Imaging Exam ended

## 2023-01-08 ENCOUNTER — Encounter (HOSPITAL_COMMUNITY): Payer: Self-pay | Admitting: Internal Medicine

## 2023-01-08 ENCOUNTER — Ambulatory Visit (HOSPITAL_COMMUNITY)
Admission: RE | Admit: 2023-01-08 | Discharge: 2023-01-08 | Disposition: A | Discharge status: Home / Self Care | Payer: Medicaid Other | Source: Ambulatory Visit | Attending: Internal Medicine | Admitting: Internal Medicine

## 2023-01-08 VITALS — BP 124/64 | HR 81 | Ht 68.0 in | Wt 184.0 lb

## 2023-01-08 DIAGNOSIS — Z7182 Exercise counseling: Secondary | ICD-10-CM | POA: Insufficient documentation

## 2023-01-08 DIAGNOSIS — I480 Paroxysmal atrial fibrillation: Secondary | ICD-10-CM | POA: Diagnosis not present

## 2023-01-08 DIAGNOSIS — K219 Gastro-esophageal reflux disease without esophagitis: Secondary | ICD-10-CM | POA: Diagnosis not present

## 2023-01-08 DIAGNOSIS — E119 Type 2 diabetes mellitus without complications: Secondary | ICD-10-CM | POA: Insufficient documentation

## 2023-01-08 DIAGNOSIS — Z7901 Long term (current) use of anticoagulants: Secondary | ICD-10-CM | POA: Insufficient documentation

## 2023-01-08 DIAGNOSIS — Z7984 Long term (current) use of oral hypoglycemic drugs: Secondary | ICD-10-CM | POA: Insufficient documentation

## 2023-01-08 DIAGNOSIS — I10 Essential (primary) hypertension: Secondary | ICD-10-CM | POA: Insufficient documentation

## 2023-01-08 DIAGNOSIS — Z794 Long term (current) use of insulin: Secondary | ICD-10-CM | POA: Diagnosis not present

## 2023-01-08 DIAGNOSIS — Z79899 Other long term (current) drug therapy: Secondary | ICD-10-CM | POA: Diagnosis not present

## 2023-01-08 DIAGNOSIS — Z6827 Body mass index (BMI) 27.0-27.9, adult: Secondary | ICD-10-CM | POA: Diagnosis not present

## 2023-01-08 DIAGNOSIS — D6869 Other thrombophilia: Secondary | ICD-10-CM

## 2023-01-08 DIAGNOSIS — E669 Obesity, unspecified: Secondary | ICD-10-CM | POA: Diagnosis not present

## 2023-01-08 DIAGNOSIS — I4891 Unspecified atrial fibrillation: Secondary | ICD-10-CM | POA: Diagnosis not present

## 2023-01-08 DIAGNOSIS — R0683 Snoring: Secondary | ICD-10-CM | POA: Insufficient documentation

## 2023-01-08 NOTE — Progress Notes (Addendum)
Primary Care Physician: Waldon Reining, MD Primary Cardiologist: Dr. Anne Fu Primary Electrophysiologist: None Referring Physician: Dr. Donette Larry Henry Nguyen is a 62 y.o. male with a history of HTN, T2DM, GERD, CKD stage 2-3a, and paroxysmal atrial fibrillation who presents for consultation in the [facility]. The patient was scheduled for cervical spine fusion and ECG on 4/26 for pre-admission testing showed Afib with RVR. He does not appear to have cardiac awareness of his Afib. He was admitted 5/1-2 and discharged on Eliquis 5 mg BID and diltiazem CD 120 mg daily. Echo showed EF 45% so diltiazem and lisinopril stopped. Patient started on Toprol 50 mg daily and losartan 25 mg daily. Patient is on Eliquis 5 mg BID for a CHADS2VASC score of 2.  On evaluation today, he is currently in NSR. He did not have cardiac awareness of his Afib. He does not have a smart phone but has a BP cuff at home. He feels well overall.  He is compliant with anticoagulation and has not missed any doses. He has no bleeding concerns. He has a few drinks of alcohol occasionally but denies excess alcohol intake. He does snore per nieces at home.   On follow up 01/08/23, he is currently in NSR. Cardiac monitor 11/2022 showed predominantly sinus rhythm. He feels well overall. No bleeding issues on Eliquis.   Today, he denies symptoms of palpitations, chest pain, shortness of breath, orthopnea, PND, lower extremity edema, dizziness, presyncope, syncope, snoring, daytime somnolence, bleeding, or neurologic sequela. The patient is tolerating medications without difficulties and is otherwise without complaint today.   Atrial Fibrillation Risk Factors:  he does have symptoms or diagnosis of sleep apnea. he does not have a history of rheumatic fever. he does have a history of alcohol use. The patient does not have a history of early familial atrial fibrillation or other arrhythmias.  he has  a BMI of Body mass index is 27.98 kg/m.Marland Kitchen Filed Weights   01/08/23 0845  Weight: 83.5 kg     Family History  Problem Relation Age of Onset   Heart attack Mother    CAD Mother    Diabetes Sister    Hypertension Sister    Colon cancer Neg Hx    Colon polyps Neg Hx     Atrial Fibrillation Management history:  Previous antiarrhythmic drugs: None Previous cardioversions: None Previous ablations: None Anticoagulation history: Eliquis 5 mg BID   Past Medical History:  Diagnosis Date   Arthritis    Diabetes mellitus without complication (HCC)    GERD (gastroesophageal reflux disease)    Hypertension    Past Surgical History:  Procedure Laterality Date   COLONOSCOPY  2010   COLONOSCOPY N/A 07/09/2018   Procedure: COLONOSCOPY;  Surgeon: West Bali, MD;  [facility];  Service: Endoscopy;  Laterality: N/A;  11:15   ESOPHAGOGASTRODUODENOSCOPY  2010   KNEE ARTHROSCOPY WITH MEDIAL MENISECTOMY Right 03/01/2021   Procedure: KNEE ARTHROSCOPY WITH MEDIAL MENISECTOMY;  Surgeon: Vickki Hearing, MD;  Location: [facility];  Service: Orthopedics;  Laterality: Right;   KNEE ARTHROSCOPY WITH MEDIAL MENISECTOMY Left 10/18/2021   Procedure: KNEE ARTHROSCOPY WITH PARTIAL MEDIAL MENISECTOMY TEAR;  Surgeon: Vickki Hearing, MD;  Location: [facility];  Service: Orthopedics;  Laterality: Left;   POLYPECTOMY  07/09/2018   Procedure: POLYPECTOMY;  Surgeon: West Bali, MD;  [facility];  Service: Endoscopy;;    Current Outpatient Medications  Medication Sig Dispense Refill   albuterol (  VENTOLIN HFA) 108 (90 Base) MCG/ACT inhaler Inhale 1-2 puffs into the lungs every 6 (six) hours as needed for wheezing or shortness of breath.     apixaban (ELIQUIS) 5 MG TABS tablet Take 1 tablet (5 mg total) by mouth 2 (two) times daily. 60 tablet 5   glipiZIDE (GLUCOTROL) 10 MG tablet Take 10 mg by mouth daily before breakfast.     insulin glargine (LANTUS SOLOSTAR) 100 UNIT/ML Solostar  Pen Inject 46 Units into the skin at bedtime.     losartan (COZAAR) 50 MG tablet Take 1 tablet (50 mg total) by mouth daily. 30 tablet 3   metFORMIN (GLUCOPHAGE) 500 MG tablet Take 500 mg by mouth daily with breakfast.   5   metoprolol succinate (TOPROL-XL) 50 MG 24 hr tablet Take 1 tablet (50 mg total) by mouth daily. Take with or immediately following a meal. 90 tablet 3   omeprazole (PRILOSEC) 20 MG capsule Take 20 mg by mouth daily before breakfast.   5   No current facility-administered medications for this encounter.    No Known Allergies  ROS- All systems are reviewed and negative except as per the HPI above.  Physical Exam: Vitals:   01/08/23 0845  BP: 124/64  Pulse: 81  Weight: 83.5 kg  Height: 5\' 8"  (1.727 m)    GEN- The patient is well appearing, alert and oriented x 3 today.   Neck - no JVD or carotid bruit noted Lungs- Clear to ausculation bilaterally, normal work of breathing Heart- Regular rate and rhythm, no murmurs, rubs or gallops, PMI not laterally displaced Extremities- no clubbing, cyanosis, or edema Skin - no rash or ecchymosis noted   Wt Readings from Last 3 Encounters:  01/08/23 83.5 kg  11/25/22 93.8 kg  10/23/22 92.3 kg    EKG today demonstrates  Vent. rate 81 BPM PR interval 146 ms QRS duration 76 ms QT/QTcB 368/427 ms P-R-T axes 67 -18 30 Normal sinus rhythm Normal ECG When compared with ECG of 25-Nov-2022 09:45, PREVIOUS ECG IS PRESENT  Echo 11/06/22 demonstrated: 1. Left ventricular ejection fraction, by estimation, is 40 to 45%. The  left ventricle has mildly decreased function. The left ventricle  demonstrates global hypokinesis. There is mild left ventricular  hypertrophy. Left ventricular diastolic parameters  are consistent with Grade I diastolic dysfunction (impaired relaxation).   2. Right ventricular systolic function is normal. The right ventricular  size is normal.   3. The mitral valve is normal in structure. Trivial  mitral valve  regurgitation. No evidence of mitral stenosis.   4. The aortic valve is normal in structure. Aortic valve regurgitation is  not visualized.   Cardiac monitor 11/2022: Enrollment period 11 days 13 hours  Predominant rhythm was sinus rhythm Less than 1% ventricular and supraventricular ectopy Maximum heart rate 162, minimum heart rate 42, average heart rate 88 Patient triggered episodes associated with sinus rhythm, sinus tachycardia versus atrial tachycardia   Will Camnitz, MD  Epic records are reviewed at length today.  CHA2DS2-VASc Score = 2  The patient's score is based upon: CHF History: 0 HTN History: 1 Diabetes History: 1 Stroke History: 0 Vascular Disease History: 0 Age Score: 0 Gender Score: 0       ASSESSMENT AND PLAN: Paroxysmal Atrial Fibrillation (ICD10:  I48.0) The patient's CHA2DS2-VASc score is 2, indicating a 2.2% annual risk of stroke.    He is currently in NSR.  Cardiac monitor 11/2022 showed predominantly sinus rhythm. Continue conservative observation. Recommended rhythm monitoring device  for home use.   2. Secondary Hypercoagulable State (ICD10:  D68.69) The patient is at significant risk for stroke/thromboembolism based upon his CHA2DS2-VASc Score of 2.  Continue Apixaban (Eliquis).  No missed doses  3. Obesity Body mass index is 27.98 kg/m. Lifestyle modification was discussed at length including regular exercise and weight reduction. Recommended daily walking  4. Snoring with concern for Obstructive sleep apnea He defers sleep study at this time.  5. HTN Stable today no changes.   Follow up 6 months.    Lake Bells, PA-C Afib Clinic [facility] [address] [place], [place] [address] [phone] 01/08/2023 9:50 AM

## 2023-01-16 LAB — HEMOGLOBIN A1C: Hemoglobin A1C: >16

## 2023-01-30 ENCOUNTER — Telehealth (HOSPITAL_COMMUNITY): Payer: Self-pay | Admitting: *Deleted

## 2023-01-30 MED ORDER — FUROSEMIDE 20 MG PO TABS: 20.0000 mg | ORAL_TABLET | Freq: Every day | ORAL | 0 refills | Status: DC | PRN

## 2023-01-30 NOTE — Telephone Encounter (Signed)
Patient called in stating he is having lower extremity swelling denies SOB or orthopnea. Weight is stable. Does admit to higher sodium diet. Discussed with Landry Mellow PA will call in lasix 20mg  to take daily for the next 3 days call Monday with update of response. Pt encouraged to reach out to PCP for follow up as well. Pt in agreement.

## 2023-02-02 NOTE — Telephone Encounter (Signed)
Patient called back and his swelling on the right side of his lower extremity has decreased some. He still has swelling on the left extremity into the ankle. Per Tawny Hopping patient should contact his pcp or general cardiology regarding the swelling. Communicated with patient and he verbalized understanding.

## 2023-02-11 ENCOUNTER — Other Ambulatory Visit (HOSPITAL_COMMUNITY): Payer: Self-pay | Admitting: *Deleted

## 2023-02-11 MED ORDER — FUROSEMIDE 20 MG PO TABS: 20.0000 mg | ORAL_TABLET | Freq: Every day | ORAL | 1 refills | Status: AC | PRN

## 2023-02-13 LAB — HEMOGLOBIN A1C: Hemoglobin A1C: 12.8

## 2023-03-26 ENCOUNTER — Ambulatory Visit (INDEPENDENT_AMBULATORY_CARE_PROVIDER_SITE_OTHER): Payer: Medicaid Other | Admitting: Orthopaedic Surgery

## 2023-03-26 DIAGNOSIS — M4722 Other spondylosis with radiculopathy, cervical region: Secondary | ICD-10-CM

## 2023-03-26 NOTE — Progress Notes (Signed)
Office Visit Note   Patient: Henry Nguyen           Date of Birth: September 09, 1960           MRN: [id number] Visit Date: 03/26/2023              Requested by: Waldon Reining, MD [address] [place],  [place] [address] PCP: Waldon Reining, MD   Assessment & Plan: Visit Diagnoses:  1. Other spondylosis with radiculopathy, cervical region     Plan: Patient will need to be cleared by cardiology and then rescheduled for single level cervical fusion he would stay overnight but have to be off his Eliquis before surgery could be restarted a couple days after surgery.  He will need to be on a monitored bed after surgery.  Will await message from cardiology to let us know for able to proceed with his C5-6 and the level fusion.  Follow-Up Instructions: No follow-ups on file.   Orders:  No orders of the defined types were placed in this encounter.  No orders of the defined types were placed in this encounter.     Procedures: No procedures performed   Clinical Data: No additional findings.   Subjective: Chief Complaint  Patient presents with   Neck - Follow-up, Pain    Pt here to discuss moving forward with surgery now that he's cleared by cardiology.    HPI 62 year old male returns to surgery with canceled when he got back to the operating room and went into atrial flutter with rapid ventricular rate.  He is now in regular rhythm ejection fraction was 45%.  He has been going to the atrial fibs clinic.  He states neck is continuing to give him bad pain radiating into the right side and the shoulder down to the hand radial side of the hand.  He states he wants to proceed with surgery if possible.  Review of Systems unchanged   Objective: Vital Signs: There were no vitals taken for this visit.  Physical Exam Constitutional:      Appearance: He is well-developed.  HENT:     Head: Normocephalic and atraumatic.     Right Ear: External ear normal.     Left Ear: External  ear normal.  Eyes:     Pupils: Pupils are equal, round, and reactive to light.  Neck:     Thyroid: No thyromegaly.     Trachea: No tracheal deviation.  Cardiovascular:     Rate and Rhythm: Normal rate.  Pulmonary:     Effort: Pulmonary effort is normal.     Breath sounds: No wheezing.  Abdominal:     General: Bowel sounds are normal.     Palpations: Abdomen is soft.  Musculoskeletal:     Cervical back: Neck supple.  Skin:    General: Skin is warm and dry.     Capillary Refill: Capillary refill takes less than 2 seconds.  Neurological:     Mental Status: He is alert and oriented to person, place, and time.  Psychiatric:        Behavior: Behavior normal.        Thought Content: Thought content normal.        Judgment: Judgment normal.     Ortho Exam brachioplexus tenderness again noted on the right.  No myelopathic gait problems.  Tenderness only on the right side positive Spurling on the right negative on the left.  Specialty Comments:  No specialty comments available.  Imaging: No  results found.   PMFS History: Patient Active Problem List   Diagnosis Date Noted   Hypercoagulable state due to paroxysmal atrial fibrillation (HCC) 11/25/2022   Diabetes mellitus with hyperglycemia (HCC) 10/23/2022   Chronic kidney disease, stage 2 (mild) 10/23/2022   Atrial fibrillation with RVR (HCC) 10/22/2022   Spinal stenosis of cervical region 09/25/2022   Other spondylosis with radiculopathy, cervical region 09/25/2022   Neural foraminal stenosis of cervical spine 09/25/2022   S/P left knee arthroscopy 10/18/21  11/14/2021   Arthralgia of lower leg 03/13/2021   Long term (current) use of insulin (HCC) 03/13/2021   Mixed hyperlipidemia 03/13/2021   Osteoarthritis of knee 03/13/2021   Type 2 diabetes mellitus without complications (HCC) 03/13/2021   Vitamin D deficiency 03/13/2021   Folliculitis 03/13/2021   S/P right knee arthroscopy 03/01/21 03/12/2021   Acute medial meniscus  tear of left knee    Special screening for malignant neoplasms, colon    Diabetic hyperosmolar non-ketotic state (HCC) 12/01/2017   Hypertension 12/01/2017   GERD (gastroesophageal reflux disease) 12/01/2017   Hyponatremia 12/01/2017   AKI (acute kidney injury) (HCC) 12/01/2017   Rectal bleeding 12/01/2017   Past Medical History:  Diagnosis Date   Arthritis    Diabetes mellitus without complication (HCC)    GERD (gastroesophageal reflux disease)    Hypertension     Family History  Problem Relation Age of Onset   Heart attack Mother    CAD Mother    Diabetes Sister    Hypertension Sister    Colon cancer Neg Hx    Colon polyps Neg Hx     Past Surgical History:  Procedure Laterality Date   COLONOSCOPY  2010   COLONOSCOPY N/A 07/09/2018   Procedure: COLONOSCOPY;  Surgeon: West Bali, MD;  [facility];  Service: Endoscopy;  Laterality: N/A;  11:15   ESOPHAGOGASTRODUODENOSCOPY  2010   KNEE ARTHROSCOPY WITH MEDIAL MENISECTOMY Right 03/01/2021   Procedure: KNEE ARTHROSCOPY WITH MEDIAL MENISECTOMY;  Surgeon: Vickki Hearing, MD;  Location: [facility];  Service: Orthopedics;  Laterality: Right;   KNEE ARTHROSCOPY WITH MEDIAL MENISECTOMY Left 10/18/2021   Procedure: KNEE ARTHROSCOPY WITH PARTIAL MEDIAL MENISECTOMY TEAR;  Surgeon: Vickki Hearing, MD;  Location: [facility];  Service: Orthopedics;  Laterality: Left;   POLYPECTOMY  07/09/2018   Procedure: POLYPECTOMY;  Surgeon: West Bali, MD;  [facility];  Service: Endoscopy;;   Social History   Occupational History   Not on file  Tobacco Use   Smoking status: Never   Smokeless tobacco: Never  Vaping Use   Vaping status: Never Used  Substance and Sexual Activity   Alcohol use: Yes    Alcohol/week: 3.0 standard drinks of alcohol    Types: 3 Cans of beer per week   Drug use: Not Currently    Types: "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

## 2023-04-02 ENCOUNTER — Telehealth: Payer: Self-pay | Admitting: *Deleted

## 2023-04-02 DIAGNOSIS — Z01818 Encounter for other preprocedural examination: Secondary | ICD-10-CM

## 2023-04-02 DIAGNOSIS — I480 Paroxysmal atrial fibrillation: Secondary | ICD-10-CM

## 2023-04-02 NOTE — Telephone Encounter (Signed)
Pre-operative Risk Assessment    Patient Name: Henry Nguyen  DOB: Aug 04, 1960 MRN: [id number]      Request for Surgical Clearance    Procedure:   C5-6 Cervical Fusion  Date of Surgery:  Clearance TBD                                 Surgeon:  Dr. Annell Greening Surgeon's Group or Practice Name:  [facility] Phone number:  [phone] Fax number:  [phone]   Type of Clearance Requested:   - Medical  - Pharmacy:  Hold Apixaban (Eliquis) X 3 days prior   Type of Anesthesia:  Not Indicated   Additional requests/questions:    Signed, Emmit Pomfret   04/02/2023, 9:06 AM

## 2023-04-02 NOTE — Telephone Encounter (Signed)
Patient with diagnosis of atrial fibrillation on Eliquis for anticoagulation.    Procedure:   C5-6 Cervical Fusion   Date of Surgery:  Clearance TBD   CHA2DS2-VASc Score = 2   This indicates a 2.2% annual risk of stroke. The patient's score is based upon: CHF History: 0 HTN History: 1 Diabetes History: 1 Stroke History: 0 Vascular Disease History: 0 Age Score: 0 Gender Score: 0  CrCl > 100 Platelet count 183  Per office protocol, patient can hold Eliquis for 3 days prior to procedure.   Patient will not need bridging with Lovenox (enoxaparin) around procedure.  **This guidance is not considered finalized until pre-operative APP has relayed final recommendations.**

## 2023-04-03 NOTE — Telephone Encounter (Signed)
Name: Henry Nguyen  DOB: Feb 12, 1961  MRN: [id number]  Primary Cardiologist: Donato Schultz, MD   Preoperative team, please contact this patient and set up a phone call appointment for further preoperative risk assessment. Please obtain consent and complete medication review. Thank you for your help.  I confirm that guidance regarding antiplatelet and oral anticoagulation therapy has been completed and, if necessary, noted below.   Per office protocol, patient can hold Eliquis for 3 days prior to procedure.   Patient will not need bridging with Lovenox (enoxaparin) around procedure. Please resume Eliquis as soon as possible postprocedure, at the discretion of the surgeon.   I also confirmed the patient resides in the state of [place]. As per [facility] Medical Board telemedicine laws, the patient must reside in the state in which the provider is licensed.   Joylene Grapes, NP 04/03/2023, 7:16 AM [facility]

## 2023-04-03 NOTE — Telephone Encounter (Signed)
Patient returned Pre-op call. 

## 2023-04-03 NOTE — Telephone Encounter (Signed)
I s/w the pt and he tells me that his A1c is elevated and the surgeon wants that level to come down before doing surgery. I informed the pt that I do not want top schedule his tele appt too soon then we have to repeat if surgery is further out. I stated I will call the surgeon's office and get more of a feel of when they may be thinking for surgery date. I asked the pt if he had a lab appt coming up to re-check his A1c and he said no.

## 2023-04-03 NOTE — Telephone Encounter (Signed)
I called to s/w the pt but his mother said he was not home right now. She took a verbal message to have the pt call back. Pt will need a tele pre op appt.

## 2023-04-03 NOTE — Telephone Encounter (Signed)
I will also fax notes to surgeon office asking about the surgery.

## 2023-04-03 NOTE — Telephone Encounter (Signed)
I called the surgeon's office and and s/w Tivis Ringer, surgery scheduler in regard to what the pt stated to me earlier today, see notes.   Sherri tells me that Dr. Anne Fu and Dr. Ophelia Charter discussed about the pt needing to have some cardiac testing done first before the pt can be cleared. Sherri suggested that I reach out to Dr. Anne Fu nurse Orlie Dakin. RN. I will send RN message in regard to the information I have received today.   I explained that I do not want to schedule tele appt too soon and then we have to repeat if we did too early. I do not want to do that to the pt as the tele appt is billed to their insurance and I do not want to cause more of a co-pay then needed.

## 2023-04-06 ENCOUNTER — Encounter: Payer: Self-pay | Admitting: *Deleted

## 2023-04-06 NOTE — Telephone Encounter (Signed)
Reviewed with Dr Anne Fu who ordered Steffanie Dunn for pre-op clearance. Order placed.

## 2023-04-06 NOTE — Telephone Encounter (Signed)
Pt scheduled for stress test 04/20/23.

## 2023-04-06 NOTE — Addendum Note (Signed)
Addended by: Sharin Grave on: 04/06/2023 01:50 PM   Modules accepted: Orders

## 2023-04-07 NOTE — Addendum Note (Signed)
Addended by: Donato Schultz C on: 04/07/2023 03:11 PM   Modules accepted: Orders

## 2023-04-08 ENCOUNTER — Telehealth: Payer: Self-pay | Admitting: *Deleted

## 2023-04-08 NOTE — Telephone Encounter (Signed)
Pt has been scheduled for tele pre op appt 07/07/22. Med rec and consent are done.

## 2023-04-08 NOTE — Telephone Encounter (Signed)
Pt has been scheduled for tele pre op appt 04/28/23. Med rec and consent are done.     Patient Consent for Virtual Visit        Henry Nguyen has provided verbal consent on 04/08/2023 for a virtual visit (video or telephone).   CONSENT FOR VIRTUAL VISIT FOR:  Henry Nguyen  By participating in this virtual visit I agree to the following:  I hereby voluntarily request, consent and authorize [facility] and its employed or contracted physicians, physician assistants, nurse practitioners or other licensed health care professionals (the Practitioner), to provide me with telemedicine health care services (the "Services") as deemed necessary by the treating Practitioner. I acknowledge and consent to receive the Services by the Practitioner via telemedicine. I understand that the telemedicine visit will involve communicating with the Practitioner through live audiovisual communication technology and the disclosure of certain medical information by electronic transmission. I acknowledge that I have been given the opportunity to request an in-person assessment or other available alternative prior to the telemedicine visit and am voluntarily participating in the telemedicine visit.  I understand that I have the right to withhold or withdraw my consent to the use of telemedicine in the course of my care at any time, without affecting my right to future care or treatment, and that the Practitioner or I may terminate the telemedicine visit at any time. I understand that I have the right to inspect all information obtained and/or recorded in the course of the telemedicine visit and may receive copies of available information for a reasonable fee.  I understand that some of the potential risks of receiving the Services via telemedicine include:  Delay or interruption in medical evaluation due to technological equipment failure or disruption; Information transmitted may not be sufficient (e.g. poor  resolution of images) to allow for appropriate medical decision making by the Practitioner; and/or  In rare instances, security protocols could fail, causing a breach of personal health information.  Furthermore, I acknowledge that it is my responsibility to provide information about my medical history, conditions and care that is complete and accurate to the best of my ability. I acknowledge that Practitioner's advice, recommendations, and/or decision may be based on factors not within their control, such as incomplete or inaccurate data provided by me or distortions of diagnostic images or specimens that may result from electronic transmissions. I understand that the practice of medicine is not an exact science and that Practitioner makes no warranties or guarantees regarding treatment outcomes. I acknowledge that a copy of this consent can be made available to me via my patient portal [facility] MyChart), or I can request a printed copy by calling the office of [facility].    I understand that my insurance will be billed for this visit.   I have read or had this consent read to me. I understand the contents of this consent, which adequately explains the benefits and risks of the Services being provided via telemedicine.  I have been provided ample opportunity to ask questions regarding this consent and the Services and have had my questions answered to my satisfaction. I give my informed consent for the services to be provided through the use of telemedicine in my medical care

## 2023-04-14 NOTE — Patient Instructions (Signed)

## 2023-04-16 ENCOUNTER — Ambulatory Visit (INDEPENDENT_AMBULATORY_CARE_PROVIDER_SITE_OTHER): Payer: Medicaid Other | Admitting: Nurse Practitioner

## 2023-04-16 ENCOUNTER — Telehealth (HOSPITAL_COMMUNITY): Payer: Self-pay | Admitting: *Deleted

## 2023-04-16 ENCOUNTER — Encounter: Payer: Self-pay | Admitting: Nurse Practitioner

## 2023-04-16 VITALS — BP 132/78 | HR 65 | Ht 68.0 in | Wt 207.2 lb

## 2023-04-16 DIAGNOSIS — E782 Mixed hyperlipidemia: Secondary | ICD-10-CM | POA: Diagnosis not present

## 2023-04-16 DIAGNOSIS — Z794 Long term (current) use of insulin: Secondary | ICD-10-CM

## 2023-04-16 DIAGNOSIS — Z7984 Long term (current) use of oral hypoglycemic drugs: Secondary | ICD-10-CM

## 2023-04-16 DIAGNOSIS — E1165 Type 2 diabetes mellitus with hyperglycemia: Secondary | ICD-10-CM | POA: Diagnosis not present

## 2023-04-16 DIAGNOSIS — I10 Essential (primary) hypertension: Secondary | ICD-10-CM

## 2023-04-16 MED ORDER — DEXCOM G7 RECEIVER DEVI: 1.0000 | Freq: Once | 0 refills | Status: DC

## 2023-04-16 MED ORDER — METFORMIN HCL 500 MG PO TABS: 500.0000 mg | ORAL_TABLET | Freq: Two times a day (BID) | ORAL | 1 refills | Status: DC

## 2023-04-16 MED ORDER — DEXCOM G7 SENSOR MISC: 1.0000 | 3 refills | Status: AC

## 2023-04-16 NOTE — Progress Notes (Signed)
Endocrinology Consult Note       04/16/2023, 9:54 AM   Subjective:    Patient ID: Henry Nguyen, male    DOB: 1960-11-03.  Henry Nguyen is being seen in consultation for management of currently uncontrolled symptomatic diabetes requested by  Wilmon Pali, FNP.   Past Medical History:  Diagnosis Date   Arthritis    Diabetes mellitus without complication (HCC)    GERD (gastroesophageal reflux disease)    Hypertension     Past Surgical History:  Procedure Laterality Date   COLONOSCOPY  2010   COLONOSCOPY N/A 07/09/2018   Procedure: COLONOSCOPY;  Surgeon: West Bali, MD;  Location: [facility];  Service: Endoscopy;  Laterality: N/A;  11:15   ESOPHAGOGASTRODUODENOSCOPY  2010   KNEE ARTHROSCOPY WITH MEDIAL MENISECTOMY Right 03/01/2021   Procedure: KNEE ARTHROSCOPY WITH MEDIAL MENISECTOMY;  Surgeon: Vickki Hearing, MD;  Location: [facility] ORS;  Service: Orthopedics;  Laterality: Right;   KNEE ARTHROSCOPY WITH MEDIAL MENISECTOMY Left 10/18/2021   Procedure: KNEE ARTHROSCOPY WITH PARTIAL MEDIAL MENISECTOMY TEAR;  Surgeon: Vickki Hearing, MD;  Location: [facility] OR[facility];  Service: Orthopedics;  Laterality: Left;   POLYPECTOMY  07/09/2018   Procedure: POLYPECTOMY;  Surgeon: West Bali, MD;  Location: [facility];  Service: Endoscopy;;    Social History   Socioeconomic History   Marital status: Divorced    Spouse name: Not on file   Number of children: 0   Years of education: Not on file   Highest education level: Not on file  Occupational History   Not on file  Tobacco Use   Smoking status: Never   Smokeless tobacco: Never  Vaping Use   Vaping status: Never Used  Substance and Sexual Activity   Alcohol use: Yes    Alcohol/week: 3.0 standard drinks of alcohol    Types: 3 Cans of beer per week   Drug use: Not Currently    Types: "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 Single. No kids.   Social Determinants of Health   Financial Resource Strain: Low Risk  (01/17/2023)   [facility]   Overall Financial Resource Strain (CARDIA)    Difficulty of Paying Living Expenses: Not hard at all  Food Insecurity: No Food Insecurity (01/17/2023)   [facility]   Hunger Vital Sign    Worried About Running Out of Food in the Last Year: Never true    Ran Out of Food in the Last Year: Never true  Transportation Needs: No Transportation Needs (01/17/2023)   Received from [facility] - [facility]    Lack of Transportation (Medical): No    Lack of Transportation (Non-Medical): No  Physical Activity: Not on file  Stress: Not on file  Social Connections: Not on file    Family History  Problem Relation Age of Onset   Heart attack Mother    CAD Mother    Diabetes Sister    Hypertension Sister  Colon cancer Neg Hx    Colon polyps Neg Hx     Outpatient Encounter Medications as of 04/16/2023  Medication Sig   albuterol (VENTOLIN HFA) 108 (90 Base) MCG/ACT inhaler Inhale 1-2 puffs into the lungs every 6 (six) hours as needed for wheezing or shortness of breath.   apixaban (ELIQUIS) 5 MG TABS tablet Take 1 tablet (5 mg total) by mouth 2 (two) times daily.   Continuous Glucose Receiver (DEXCOM G7 RECEIVER) DEVI 1 Device by Does not apply route once for 1 dose.   Continuous Glucose Sensor (DEXCOM G7 SENSOR) MISC Inject 1 Application into the skin as directed. Change sensor every 10 days as directed.   furosemide (LASIX) 20 MG tablet Take 1 tablet (20 mg total) by mouth daily as needed (swelling/wt gain).   glipiZIDE (GLUCOTROL) 10 MG tablet Take 5 mg by mouth daily before breakfast.   insulin glargine (LANTUS SOLOSTAR) 100 UNIT/ML Solostar Pen Inject 40 Units into the skin at bedtime.   insulin lispro (HUMALOG)  100 UNIT/ML injection Inject 5-11 Units into the skin 3 (three) times daily with meals.   losartan (COZAAR) 50 MG tablet Take 1 tablet (50 mg total) by mouth daily.   metoprolol succinate (TOPROL-XL) 50 MG 24 hr tablet Take 1 tablet (50 mg total) by mouth daily. Take with or immediately following a meal.   omeprazole (PRILOSEC) 20 MG capsule Take 20 mg by mouth daily before breakfast.    [DISCONTINUED] metFORMIN (GLUCOPHAGE) 500 MG tablet Take 500 mg by mouth daily with breakfast.    metFORMIN (GLUCOPHAGE) 500 MG tablet Take 1 tablet (500 mg total) by mouth 2 (two) times daily with a meal.   No facility-administered encounter medications on file as of 04/16/2023.    ALLERGIES: No Known Allergies  VACCINATION STATUS: Immunization History  Administered Date(s) Administered   Influenza,inj,Quad PF,6+ Mos 03/22/2018, 03/30/2019, 03/27/2020   Influenza,trivalent, recombinat, inj, PF 05/29/2017    Diabetes He presents for his initial diabetic visit. He has type 2 diabetes mellitus. Onset time: diagnosed at approx age of 62. His disease course has been fluctuating. There are no hypoglycemic associated symptoms. Associated symptoms include blurred vision, fatigue, foot paresthesias, polydipsia and polyuria. There are no hypoglycemic complications. Symptoms are stable. Diabetic complications include nephropathy. Risk factors for coronary artery disease include diabetes mellitus, dyslipidemia, family history, obesity, male sex and hypertension. Current diabetic treatment includes intensive insulin program and oral agent (dual therapy). He is compliant with treatment most of the time. His weight is stable. He is following a generally unhealthy diet. When asked about meal planning, he reported none. He has not had a previous visit with a dietitian. He participates in exercise intermittently. (He presents today for his consultation with no meter or logs to review.  His most recent A1c on 8/23 was 12.8%.  He  monitors glucose twice daily.  He drinks mostly water, zero sugar soda, and occasionally a tea when he goes out.  He does not eat on any routine pattern.  He maintains active lifestyle with walking.  He is UTD on eye exam, has never been seen by podiatry in the past.) An ACE inhibitor/angiotensin II receptor blocker is being taken. He does not see a podiatrist.Eye exam is current.     Review of systems  Constitutional: + Minimally fluctuating body weight, current Body mass index is 31.5 kg/m., no fatigue, no subjective hyperthermia, no subjective hypothermia Eyes: no blurry vision, no xerophthalmia ENT: no sore throat, no nodules palpated in throat,  no dysphagia/odynophagia, no hoarseness Cardiovascular: no chest pain, no shortness of breath, no palpitations, no leg swelling Respiratory: no cough, no shortness of breath Gastrointestinal: no nausea/vomiting/diarrhea Musculoskeletal: no muscle/joint aches Skin: no rashes, no hyperemia Neurological: no tremors, no numbness, no tingling, no dizziness Psychiatric: no depression, no anxiety  Objective:     BP 132/78 (BP Location: Right Arm, Patient Position: Sitting, Cuff Size: Large)   Pulse 65   Ht 5\' 8"  (1.727 m)   Wt 207 lb 3.2 oz (94 kg)   BMI 31.50 kg/m   Wt Readings from Last 3 Encounters:  04/16/23 207 lb 3.2 oz (94 kg)  01/08/23 184 lb (83.5 kg)  11/25/22 206 lb 12.8 oz (93.8 kg)     BP Readings from Last 3 Encounters:  04/16/23 132/78  01/08/23 124/64  11/25/22 (!) 152/86     Physical Exam- Limited  Constitutional:  Body mass index is 31.5 kg/m. , not in acute distress, normal state of mind Eyes:  EOMI, no exophthalmos Neck: Supple Cardiovascular: RRR, no murmurs, rubs, or gallops, no edema Respiratory: Adequate breathing efforts, no crackles, rales, rhonchi, or wheezing Musculoskeletal: no gross deformities, strength intact in all four extremities, no gross restriction of joint movements Skin:  no rashes, no  hyperemia Neurological: no tremor with outstretched hands   Diabetic Foot Exam - Simple   No data filed     CMP ( most recent) CMP     Component Value Date/Time   NA 134 (L) 10/23/2022 0055   K 3.8 10/23/2022 0055   CL 100 10/23/2022 0055   CO2 26 10/23/2022 0055   GLUCOSE 439 (H) 10/23/2022 0055   BUN 20 10/23/2022 0055   CREATININE 1.42 (H) 10/23/2022 0055   CALCIUM 7.9 (L) 10/23/2022 0055   PROT 5.6 (L) 10/23/2022 0055   ALBUMIN 2.9 (L) 10/23/2022 0055   AST 14 (L) 10/23/2022 0055   ALT 14 10/23/2022 0055   ALKPHOS 74 10/23/2022 0055   BILITOT 0.6 10/23/2022 0055   GFRNONAA 56 (L) 10/23/2022 0055     Diabetic Labs (most recent): Lab Results  Component Value Date   HGBA1C 12.8 02/13/2023   HGBA1C >16 01/16/2023   HGBA1C 9.1 (H) 10/17/2022     Lipid Panel ( most recent) Lipid Panel  No results found for: "CHOL", "TRIG", "HDL", "CHOLHDL", "VLDL", "LDLCALC", "LDLDIRECT", "LABVLDL"    Lab Results  Component Value Date   TSH 1.250 10/22/2022   TSH 0.480 12/02/2017           Assessment & Plan:   1) Type 2 diabetes mellitus with hyperglycemia, with long-term current use of insulin (HCC)  He presents today for his consultation with no meter or logs to review.  His most recent A1c on 8/23 was 12.8%.  He monitors glucose twice daily.  He drinks mostly water, zero sugar soda, and occasionally a tea when he goes out.  He does not eat on any routine pattern.  He maintains active lifestyle with walking.  He is UTD on eye exam, has never been seen by podiatry in the past.  - Henry Nguyen has currently uncontrolled symptomatic type 2 DM since 62 years of age, with most recent A1c of 12.8 %.   -Recent labs reviewed.  - I had a long discussion with him about the progressive nature of diabetes and the pathology behind its complications. -his diabetes is complicated by CKD stage 3a and he remains at a high risk for more acute and chronic complications which include  CAD, CVA, CKD, retinopathy, and neuropathy. These are all discussed in detail with him.  The following Lifestyle Medicine recommendations according to American College of Lifestyle Medicine [facility]) were discussed and offered to patient and he agrees to start the journey:  A. Whole Foods, Plant-based plate comprising of fruits and vegetables, plant-based proteins, whole-grain carbohydrates was discussed in detail with the patient.   A list for source of those nutrients were also provided to the patient.  Patient will use only water or unsweetened tea for hydration. B.  The need to stay away from risky substances including alcohol, smoking; obtaining 7 to 9 hours of restorative sleep, at least 150 minutes of moderate intensity exercise weekly, the importance of healthy social connections,  and stress reduction techniques were discussed. C.  A full color page of Calorie density of various food groups per pound showing examples of each food groups was provided to the patient.  - I have counseled him on diet and weight management by adopting a carbohydrate restricted/protein rich diet. Patient is encouraged to switch to unprocessed or minimally processed complex starch and increased protein intake (animal or plant source), fruits, and vegetables. -  he is advised to stick to a routine mealtimes to eat 3 meals a day and avoid unnecessary snacks (to snack only to correct hypoglycemia).   - he acknowledges that there is a room for improvement in his food and drink choices. - Suggestion is made for him to avoid simple carbohydrates from his diet including Cakes, Sweet Desserts, Ice Cream, Soda (diet and regular), Sweet Tea, Candies, Chips, Cookies, Store Bought Juices, Alcohol in Excess of 1-2 drinks a day, Artificial Sweeteners, Coffee Creamer, and "Sugar-free" Products. This will help patient to have more stable blood glucose profile and potentially avoid unintended weight gain.  - I have approached him with  the following individualized plan to manage his diabetes and patient agrees:   -He is advised to continue his Lantus/Basaglar but drop the dose to 40 units SQ nightly.  Will adjust his Novolog to 5-11 units TID with meals if glucose is above 90 and he is eating (Specific instructions on how to titrate insulin dosage based on glucose readings given to patient in writing).  He demonstrated understanding of how to properly dose insulin with SSI chart with me today.  -he is encouraged to start monitoring glucose 4 times daily, before meals and before bed, to log their readings on the clinic sheets provided, and bring them to review at follow up appointment in 3 months.  He could benefit from CGM device.  I sent in Dexcom G7 with receiver to his pharmacy.  - he is warned not to take insulin without proper monitoring per orders. - Adjustment parameters are given to him for hypo and hyperglycemia in writing. - he is encouraged to call clinic for blood glucose levels less than 70 or above 300 mg /dl. - he is advised to continue Metformin 500 mg po twice daily with meals, therapeutically suitable for patient.  He is advised to lower his Glipizide to  mg po daily with breakfast.  - he will be considered for incretin therapy as appropriate next visit.  - Specific targets for  A1c; LDL, HDL, and Triglycerides were discussed with the patient.  2) Blood Pressure /Hypertension:  his blood pressure is controlled to target.   he is advised to continue his current medications including Losartan 50 mg p.o. daily with breakfast.  3) Lipids/Hyperlipidemia:    There is no  recent lipid panel available to review nor is he on any lipid lowering medications.  Will check lipid panel on subsequent visits.  4)  Weight/Diet:  his Body mass index is 31.5 kg/m.  -  clearly complicating his diabetes care.   he is a candidate for weight loss. I discussed with him the fact that loss of 5 - 10% of his  current body weight will  have the most impact on his diabetes management.  Exercise, and detailed carbohydrates information provided  -  detailed on discharge instructions.  5) Chronic Care/Health Maintenance: -he is on ACEI/ARB and not on Statin medications and is encouraged to initiate and continue to follow up with Ophthalmology, Dentist, Podiatrist at least yearly or according to recommendations, and advised to stay away from smoking. I have recommended yearly flu vaccine and pneumonia vaccine at least every 5 years; moderate intensity exercise for up to 150 minutes weekly; and sleep for at least 7 hours a day.  - he is advised to maintain close follow up with Wilmon Pali, FNP for primary care needs, as well as his other providers for optimal and coordinated care.   - Time spent in this patient care: 60 min, of which > 50% was spent in counseling him about his diabetes and the rest reviewing his blood glucose logs, discussing his hypoglycemia and hyperglycemia episodes, reviewing his current and previous labs/studies (including abstraction from other facilities) and medications doses and developing a long term treatment plan based on the latest standards of care/guidelines; and documenting his care.    Please refer to Patient Instructions for Blood Glucose Monitoring and Insulin/Medications Dosing Guide" in media tab for additional information. Please also refer to "Patient Self Inventory" in the Media tab for reviewed elements of pertinent patient history.  [employer] participated in the discussions, expressed understanding, and voiced agreement with the above plans.  All questions were answered to his satisfaction. he is encouraged to contact clinic should he have any questions or concerns prior to his return visit.     Follow up plan: - Return in about 3 months (around 07/17/2023) for Diabetes F/U with A1c in office, No previsit labs, Bring meter and logs.    Ronny Bacon, [facility]  [facility] [address] [place], [place] [address] Phone: [phone] Fax: [phone]  04/16/2023, 9:54 AM

## 2023-04-16 NOTE — Telephone Encounter (Signed)

## 2023-04-20 ENCOUNTER — Ambulatory Visit (HOSPITAL_COMMUNITY): Payer: Medicaid Other | Attending: Internal Medicine

## 2023-04-20 DIAGNOSIS — I480 Paroxysmal atrial fibrillation: Secondary | ICD-10-CM | POA: Insufficient documentation

## 2023-04-20 DIAGNOSIS — Z01818 Encounter for other preprocedural examination: Secondary | ICD-10-CM | POA: Insufficient documentation

## 2023-04-20 LAB — MYOCARDIAL PERFUSION IMAGING
LV dias vol: 149 mL (ref 62–150)
LV sys vol: 78 mL
Nuc Stress EF: 47 %
Peak HR: 87 {beats}/min
Rest HR: 57 {beats}/min
Rest Nuclear Isotope Dose: 9.1 mCi
SDS: 0
SRS: 0
SSS: 0
ST Depression (mm): 0 mm
Stress Nuclear Isotope Dose: 29.5 mCi
TID: 1.08

## 2023-04-20 MED ORDER — TECHNETIUM TC 99M TETROFOSMIN IV KIT
9.1000 | PACK | Freq: Once | INTRAVENOUS | Status: AC | PRN
  Administered 2023-04-20: 9.1 via INTRAVENOUS

## 2023-04-20 MED ORDER — TECHNETIUM TC 99M TETROFOSMIN IV KIT
29.5000 | PACK | Freq: Once | INTRAVENOUS | Status: AC | PRN
  Administered 2023-04-20: 29.5 via INTRAVENOUS

## 2023-04-20 MED ORDER — REGADENOSON 0.4 MG/5ML IV SOLN
0.4000 mg | Freq: Once | INTRAVENOUS | Status: AC
Start: 2023-04-20 — End: 2023-04-20
  Administered 2023-04-20: 0.4 mg via INTRAVENOUS

## 2023-04-28 ENCOUNTER — Ambulatory Visit: Payer: Medicaid Other | Attending: Cardiovascular Disease | Admitting: Student

## 2023-04-28 ENCOUNTER — Telehealth: Payer: Self-pay

## 2023-04-28 ENCOUNTER — Other Ambulatory Visit (HOSPITAL_COMMUNITY): Payer: Self-pay

## 2023-04-28 DIAGNOSIS — Z01810 Encounter for preprocedural cardiovascular examination: Secondary | ICD-10-CM

## 2023-04-28 NOTE — Telephone Encounter (Signed)
Pharmacy Patient Advocate Encounter   Received notification from CoverMyMeds that prior authorization for Dexcom G7 sensor is required/requested.   Insurance verification completed.   The patient is insured through [facility] .   Per test claim: PA required; PA submitted to above mentioned insurance via CoverMyMeds Key/confirmation #/[id number] Status is pending

## 2023-04-28 NOTE — Progress Notes (Signed)
Virtual Visit via Telephone Note   Because of Henry Nguyen's co-morbid illnesses, he is at least at moderate risk for complications without adequate follow up.  This format is felt to be most appropriate for this patient at this time.  The patient did not have access to video technology/had technical difficulties with video requiring transitioning to audio format only (telephone).  All issues noted in this document were discussed and addressed.  No physical exam could be performed with this format.  Please refer to the patient's chart for his consent to telehealth for [facility].  Evaluation Performed:  Preoperative cardiovascular risk assessment _____________   Date:  04/28/2023   Patient ID:  Henry Nguyen, DOB [birth year], MRN [id number] Patient Location:  Home Provider location:   Office  Primary Care Provider:  Wilmon Pali, FNP Primary Cardiologist:  Donato Schultz, MD  Chief Complaint / Patient Profile   62 y.o. y/o male with a h/o PAF on anticoagulation, hypertension, hyperlipidemia, GERD, T2DM who is pending C5-6 surgical fusion by Dr. Ophelia Charter and presents today for telephonic preoperative cardiovascular risk assessment.  History of Present Illness    Henry Nguyen is a 62 y.o. male who presents via audio/video conferencing for a telehealth visit today.  Pt was last seen in cardiology clinic on 01/08/2023 by Lake Bells, PA-C.  At that time Henry Nguyen was stable from a cardiac standpoint.  The patient is now pending procedure as outlined above. Since his last visit, he is doing well. Patient denies shortness of breath, dyspnea on exertion, lower extremity edema, orthopnea or PND. No chest pain, pressure, or tightness. No palpitations. He stays active walking for 15 minutes twice a week. He also performs light to moderate household chores and yard work including raking leaves.   Past Medical History    Past Medical History:  Diagnosis Date   Arthritis     Diabetes mellitus without complication (HCC)    GERD (gastroesophageal reflux disease)    Hypertension    Past Surgical History:  Procedure Laterality Date   COLONOSCOPY  2010   COLONOSCOPY N/A 07/09/2018   Procedure: COLONOSCOPY;  Surgeon: West Bali, MD;  [facility];  Service: Endoscopy;  Laterality: N/A;  11:15   ESOPHAGOGASTRODUODENOSCOPY  2010   KNEE ARTHROSCOPY WITH MEDIAL MENISECTOMY Right 03/01/2021   Procedure: KNEE ARTHROSCOPY WITH MEDIAL MENISECTOMY;  Surgeon: Vickki Hearing, MD;  Location: [facility];  Service: Orthopedics;  Laterality: Right;   KNEE ARTHROSCOPY WITH MEDIAL MENISECTOMY Left 10/18/2021   Procedure: KNEE ARTHROSCOPY WITH PARTIAL MEDIAL MENISECTOMY TEAR;  Surgeon: Vickki Hearing, MD;  Location: [facility];  Service: Orthopedics;  Laterality: Left;   POLYPECTOMY  07/09/2018   Procedure: POLYPECTOMY;  Surgeon: West Bali, MD;  [facility];  Service: Endoscopy;;    Allergies  No Known Allergies  Home Medications    Prior to Admission medications   Medication Sig Start Date End Date Taking? Authorizing Provider  albuterol (VENTOLIN HFA) 108 (90 Base) MCG/ACT inhaler Inhale 1-2 puffs into the lungs every 6 (six) hours as needed for wheezing or shortness of breath.    [provider]  apixaban (ELIQUIS) 5 MG TABS tablet Take 1 tablet (5 mg total) by mouth 2 (two) times daily. 10/23/22   [name], MD  Continuous Glucose Sensor (DEXCOM G7 SENSOR) MISC Inject 1 Application into the skin as directed. Change sensor every 10 days as directed. 04/16/23   Dani Gobble, NP  furosemide (  LASIX) 20 MG tablet Take 1 tablet (20 mg total) by mouth daily as needed (swelling/wt gain). 02/11/23   Eustace Pen, PA-C  glipiZIDE (GLUCOTROL) 10 MG tablet Take 5 mg by mouth daily before breakfast.    [provider]  insulin glargine (LANTUS SOLOSTAR) 100 UNIT/ML Solostar Pen Inject 40 Units into the skin at bedtime.  08/05/21   [provider]  insulin lispro (HUMALOG) 100 UNIT/ML injection Inject 5-11 Units into the skin 3 (three) times daily with meals.    [provider]  losartan (COZAAR) 50 MG tablet Take 1 tablet (50 mg total) by mouth daily. 11/25/22   Eustace Pen, PA-C  metFORMIN (GLUCOPHAGE) 500 MG tablet Take 1 tablet (500 mg total) by mouth 2 (two) times daily with a meal. 04/16/23   Dani Gobble, NP  metoprolol succinate (TOPROL-XL) 50 MG 24 hr tablet Take 1 tablet (50 mg total) by mouth daily. Take with or immediately following a meal. 11/11/22   [name], MD  omeprazole (PRILOSEC) 20 MG capsule Take 20 mg by mouth daily before breakfast.  04/13/18   [provider]    Physical Exam    Vital Signs:  Henry Nguyen does not have vital signs available for review today.  Given telephonic nature of communication, physical exam is limited. AAOx3. NAD. Normal affect.  Speech and respirations are unlabored.  Accessory Clinical Findings    None  Assessment & Plan    Primary Cardiologist: Donato Schultz, MD  Preoperative cardiovascular risk assessment.  C5-6 cervical fusion by Dr. Ophelia Charter.  Chart reviewed as part of pre-operative protocol coverage. According to the RCRI, patient has a 0.9% risk of MACE. Patient reports activity equivalent to >4.0 METS (walks for 15 minutes twice a week, performs light to moderate household activities, performs yard work including raking leaves).   Given past medical history and time since last visit, based on ACC/AHA guidelines, Henry Nguyen would be at acceptable risk for the planned procedure without further cardiovascular testing.   Patient was advised that if he develops new symptoms prior to surgery to contact our office to arrange a follow-up appointment.  he verbalized understanding.  Per Pharm D, patient may hold Eliquis for 3 days prior to procedure.  Patient will not need bridging with Lovenox (enoxaparin)  around procedure.  I will route this recommendation to the requesting party via Epic fax function.  Please call with questions.  Time:   Today, I have spent 5 minutes with the patient with telehealth technology discussing medical history, symptoms, and management plan.     Carlos Levering, NP  04/28/2023, 7:28 AM

## 2023-05-01 ENCOUNTER — Telehealth: Payer: Self-pay

## 2023-05-01 NOTE — Telephone Encounter (Signed)
Pharmacy Patient Advocate Encounter  Received notification from [facility] that Prior Authorization for Dexcom G7 sensor has been APPROVED through 10/24/2023   PA #/Case ID/Reference #: [id number]

## 2023-05-01 NOTE — Telephone Encounter (Signed)
noted 

## 2023-05-01 NOTE — Telephone Encounter (Signed)
I called patient.  He stated he had A1c at [facility] in [place].  Found result in Care Everywhere of 7.6 on 04-17-23.  Went ahead and rescheduled surgery.  Cardiac clearance has been received.  Does not need another A1c.

## 2023-05-04 NOTE — Telephone Encounter (Signed)
Patient was called and made aware. 

## 2023-05-13 ENCOUNTER — Other Ambulatory Visit: Payer: Self-pay | Admitting: Physician Assistant

## 2023-05-15 NOTE — Pre-Procedure Instructions (Signed)
Surgical Instructions   Your procedure is scheduled on Wednesday, December 4th. Report to [facility] Main Entrance "A" at 10:30 A.M., then check in with the Admitting office. Any questions or running late day of surgery: call [phone]  Questions prior to your surgery date: call [phone], Monday-Friday, 8am-4pm. If you experience any cold or flu symptoms such as cough, fever, chills, shortness of breath, etc. between now and your scheduled surgery, please notify us at the above number.     Remember:  Do not eat after midnight the night before your surgery  You may drink clear liquids until 09:30 AM the morning of your surgery.   Clear liquids allowed are: Water, Non-Citrus Juices (without pulp), Carbonated Beverages, Clear Tea (no milk, honey, etc.), Black Coffee Only (NO MILK, CREAM OR POWDERED CREAMER of any kind), and Gatorade.  Patient Instructions  The night before surgery:  No food after midnight. ONLY clear liquids after midnight   The day of surgery (if you have diabetes): Drink ONE (1) 12 oz G2 given to you in your pre admission testing appointment by 09:30 AM the morning of surgery. Drink in one sitting. Do not sip.  This drink was given to you during your hospital  pre-op appointment visit.  Nothing else to drink after completing the  12 oz bottle of G2.         If you have questions, please contact your surgeon's office.    Take these medicines the morning of surgery with A SIP OF WATER  metoprolol succinate (TOPROL-XL)  omeprazole (PRILOSEC)   May take these medicines IF NEEDED: albuterol (VENTOLIN HFA)- bring inhaler with you on day of surgery    HOLD apixaban (ELIQUIS) 3 days prior to surgery. Last dose 11/30.  One week prior to surgery, STOP taking any Aspirin (unless otherwise instructed by your surgeon) Aleve, Naproxen, Ibuprofen, Motrin, Advil, Goody's, BC's, all herbal medications, fish oil, and non-prescription vitamins.  WHAT DO I DO ABOUT MY  DIABETES MEDICATION?   Do not take glipiZIDE (GLUCOTROL) or metFORMIN (GLUCOPHAGE) the morning of surgery.  THE NIGHT BEFORE SURGERY, take 15 units (50%) of SEMGLEE.       If your CBG is greater than 220 mg/dL, you may take  of your sliding scale (correction) dose of insulin lispro (HUMALOG).   HOW TO MANAGE YOUR DIABETES BEFORE AND AFTER SURGERY  Why is it important to control my blood sugar before and after surgery? Improving blood sugar levels before and after surgery helps healing and can limit problems. A way of improving blood sugar control is eating a healthy diet by:  Eating less sugar and carbohydrates  Increasing activity/exercise  Talking with your doctor about reaching your blood sugar goals High blood sugars (greater than 180 mg/dL) can raise your risk of infections and slow your recovery, so you will need to focus on controlling your diabetes during the weeks before surgery. Make sure that the doctor who takes care of your diabetes knows about your planned surgery including the date and location.  How do I manage my blood sugar before surgery? Check your blood sugar at least 4 times a day, starting 2 days before surgery, to make sure that the level is not too high or low.  Check your blood sugar the morning of your surgery when you wake up and every 2 hours until you get to the Short Stay unit.  If your blood sugar is less than 70 mg/dL, you will need to treat for low blood  sugar: Do not take insulin. Treat a low blood sugar (less than 70 mg/dL) with  cup of clear juice (cranberry or apple), 4 glucose tablets, OR glucose gel. Recheck blood sugar in 15 minutes after treatment (to make sure it is greater than 70 mg/dL). If your blood sugar is not greater than 70 mg/dL on recheck, call [phone] for further instructions. Report your blood sugar to the short stay nurse when you get to Short Stay.  If you are admitted to the hospital after surgery: Your blood sugar  will be checked by the staff and you will probably be given insulin after surgery (instead of oral diabetes medicines) to make sure you have good blood sugar levels. The goal for blood sugar control after surgery is 80-180 mg/dL.                     Do NOT Smoke (Tobacco/Vaping) for 24 hours prior to your procedure.  If you use a CPAP at night, you may bring your mask/headgear for your overnight stay.   You will be asked to remove any contacts, glasses, piercing's, hearing aid's, dentures/partials prior to surgery. Please bring cases for these items if needed.    Patients discharged the day of surgery will not be allowed to drive home, and someone needs to stay with them for 24 hours.  SURGICAL WAITING ROOM VISITATION Patients may have no more than 2 support people in the waiting area - these visitors may rotate.   Pre-op nurse will coordinate an appropriate time for 1 ADULT support person, who may not rotate, to accompany patient in pre-op.  Children under the age of 48 must have an adult with them who is not the patient and must remain in the main waiting area with an adult.  If the patient needs to stay at the hospital during part of their recovery, the visitor guidelines for inpatient rooms apply.  Please refer to the [facility] website for the visitor guidelines for any additional information.   If you received a COVID test during your pre-op visit  it is requested that you wear a mask when out in public, stay away from anyone that may not be feeling well and notify your surgeon if you develop symptoms. If you have been in contact with anyone that has tested positive in the last 10 days please notify you surgeon.      Pre-operative 5 CHG Bathing Instructions   You can play a key role in reducing the risk of infection after surgery. Your skin needs to be as free of germs as possible. You can reduce the number of germs on your skin by washing with CHG (chlorhexidine gluconate) soap  before surgery. CHG is an antiseptic soap that kills germs and continues to kill germs even after washing.   DO NOT use if you have an allergy to chlorhexidine/CHG or antibacterial soaps. If your skin becomes reddened or irritated, stop using the CHG and notify one of our RNs at [phone].   Please shower with the CHG soap starting 4 days before surgery using the following schedule:     Please keep in mind the following:  DO NOT shave, including legs and underarms, starting the day of your first shower.   You may shave your face at any point before/day of surgery.  Place clean sheets on your bed the day you start using CHG soap. Use a clean washcloth (not used since being washed) for each shower. DO NOT sleep  with pets once you start using the CHG.   CHG Shower Instructions:  Wash your face and private area with normal soap. If you choose to wash your hair, wash first with your normal shampoo.  After you use shampoo/soap, rinse your hair and body thoroughly to remove shampoo/soap residue.  Turn the water OFF and apply about 3 tablespoons (45 ml) of CHG soap to a CLEAN washcloth.  Apply CHG soap ONLY FROM YOUR NECK DOWN TO YOUR TOES (washing for 3-5 minutes)  DO NOT use CHG soap on face, private areas, open wounds, or sores.  Pay special attention to the area where your surgery is being performed.  If you are having back surgery, having someone wash your back for you may be helpful. Wait 2 minutes after CHG soap is applied, then you may rinse off the CHG soap.  Pat dry with a clean towel  Put on clean clothes/pajamas   If you choose to wear lotion, please use ONLY the CHG-compatible lotions on the back of this paper.   Additional instructions for the day of surgery: DO NOT APPLY any lotions, deodorants, cologne, or perfumes.   Do not bring valuables to the hospital. [facility] is not responsible for any belongings/valuables. Do not wear nail polish, gel polish, artificial nails, or  any other type of covering on natural nails (fingers and toes) Do not wear jewelry or makeup Put on clean/comfortable clothes.  Please brush your teeth.  Ask your nurse before applying any prescription medications to the skin.     CHG Compatible Lotions   Aveeno Moisturizing lotion  Cetaphil Moisturizing Cream  Cetaphil Moisturizing Lotion  Clairol Herbal Essence Moisturizing Lotion, Dry Skin  Clairol Herbal Essence Moisturizing Lotion, Extra Dry Skin  Clairol Herbal Essence Moisturizing Lotion, Normal Skin  Curel Age Defying Therapeutic Moisturizing Lotion with Alpha Hydroxy  Curel Extreme Care Body Lotion  Curel Soothing Hands Moisturizing Hand Lotion  Curel Therapeutic Moisturizing Cream, Fragrance-Free  Curel Therapeutic Moisturizing Lotion, Fragrance-Free  Curel Therapeutic Moisturizing Lotion, Original Formula  Eucerin Daily Replenishing Lotion  Eucerin Dry Skin Therapy Plus Alpha Hydroxy Crme  Eucerin Dry Skin Therapy Plus Alpha Hydroxy Lotion  Eucerin Original Crme  Eucerin Original Lotion  Eucerin Plus Crme Eucerin Plus Lotion  Eucerin TriLipid Replenishing Lotion  Keri Anti-Bacterial Hand Lotion  Keri Deep Conditioning Original Lotion Dry Skin Formula Softly Scented  Keri Deep Conditioning Original Lotion, Fragrance Free Sensitive Skin Formula  Keri Lotion Fast Absorbing Fragrance Free Sensitive Skin Formula  Keri Lotion Fast Absorbing Softly Scented Dry Skin Formula  Keri Original Lotion  Keri Skin Renewal Lotion Keri Silky Smooth Lotion  Keri Silky Smooth Sensitive Skin Lotion  Nivea Body Creamy Conditioning Oil  Nivea Body Extra Enriched Lotion  Nivea Body Original Lotion  Nivea Body Sheer Moisturizing Lotion Nivea Crme  Nivea Skin Firming Lotion  NutraDerm 30 Skin Lotion  NutraDerm Skin Lotion  NutraDerm Therapeutic Skin Cream  NutraDerm Therapeutic Skin Lotion  ProShield Protective Hand Cream  Provon moisturizing lotion  Please read over the  following fact sheets that you were given.

## 2023-05-18 ENCOUNTER — Encounter: Payer: Self-pay | Admitting: Physician Assistant

## 2023-05-18 ENCOUNTER — Inpatient Hospital Stay (HOSPITAL_COMMUNITY)
Admission: RE | Admit: 2023-05-18 | Discharge: 2023-05-18 | Disposition: A | Discharge status: Home / Self Care | Payer: Medicaid Other | Source: Ambulatory Visit

## 2023-05-18 ENCOUNTER — Ambulatory Visit: Payer: Medicaid Other | Admitting: Physician Assistant

## 2023-05-18 VITALS — BP 169/88 | HR 63

## 2023-05-18 DIAGNOSIS — M4802 Spinal stenosis, cervical region: Secondary | ICD-10-CM

## 2023-05-18 NOTE — H&P (Signed)
Henry Nguyen is an 62 y.o. male.   Chief Complaint: cervical radiculapathy HPI: HPI 62 year old male referred to me by Dr. Fuller Canada for cervical spondylosis and stenosis with foraminal stenosis at C5-6 with right C6 radiculopathy.  He has had persistent problems with neck pain arm pain for several months.  He did not get relief with the prednisone disc pack.  He has been on Robaxin, Zanaflex, gabapentin.  He has noticed weakness in his arm with gripping objects.  Extreme pain when he turns his neck particularly to the right.  He is right-hand dominant states he cannot open jars has to use his left hand.  Patient is a diabetic on insulin also takes metformin glipizide and Lantus.  Last A1c was 6.7 prior to that 6.6.  Patient does take medications for hypertension on aspirin a day.  Does have hypertension and currently arthritis previous knee arthroscopies.  Denies gait disturbance.   Past Medical History:  Diagnosis Date   Arthritis    Diabetes mellitus without complication (HCC)    GERD (gastroesophageal reflux disease)    Hypertension     Past Surgical History:  Procedure Laterality Date   COLONOSCOPY  2010   COLONOSCOPY N/A 07/09/2018   Procedure: COLONOSCOPY;  Surgeon: West Bali, MD;  [facility];  Service: Endoscopy;  Laterality: N/A;  11:15   ESOPHAGOGASTRODUODENOSCOPY  2010   KNEE ARTHROSCOPY WITH MEDIAL MENISECTOMY Right 03/01/2021   Procedure: KNEE ARTHROSCOPY WITH MEDIAL MENISECTOMY;  Surgeon: Vickki Hearing, MD;  Location: [facility];  Service: Orthopedics;  Laterality: Right;   KNEE ARTHROSCOPY WITH MEDIAL MENISECTOMY Left 10/18/2021   Procedure: KNEE ARTHROSCOPY WITH PARTIAL MEDIAL MENISECTOMY TEAR;  Surgeon: Vickki Hearing, MD;  Location: [facility];  Service: Orthopedics;  Laterality: Left;   POLYPECTOMY  07/09/2018   Procedure: POLYPECTOMY;  Surgeon: West Bali, MD;  [facility];  Service: Endoscopy;;    Family History  Problem  Relation Age of Onset   Heart attack Mother    CAD Mother    Diabetes Sister    Hypertension Sister    Colon cancer Neg Hx    Colon polyps Neg Hx    Social History:  reports that he has never smoked. He has never used smokeless tobacco. He reports current alcohol use of about 3.0 standard drinks of alcohol per week. He reports that he does not currently use drugs after having used the following drugs: "Crack" cocaine.  Allergies: No Known Allergies  (Not in a hospital admission)   No results found for this or any previous visit (from the past 48 hour(s)). No results found.  Review of Systems  All other systems reviewed and are negative.   There were no vitals taken for this visit. Physical Exam   Constitutional:      Appearance: He is well-developed.  HENT:     Head: Normocephalic and atraumatic.     Right Ear: External ear normal.     Left Ear: External ear normal.  Eyes:     Pupils: Pupils are equal, round, and reactive to light.  Neck:     Thyroid: No thyromegaly.     Trachea: No tracheal deviation.  Cardiovascular:     Rate and Rhythm: Normal rate.  Pulmonary:     Effort: Pulmonary effort is normal.     Breath sounds: No wheezing.  Abdominal:     General: Bowel sounds are normal.     Palpations: Abdomen is soft.  Musculoskeletal:  Cervical back: Neck supple.  Skin:    General: Skin is warm and dry.     Capillary Refill: Capillary refill takes less than 2 seconds.  Neurological:     Mental Status: He is alert and oriented to person, place, and time.  Psychiatric:        Behavior: Behavior normal.        Thought Content: Thought content normal.        Judgment: Judgment normal.        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  Slight wrist extension weakness decreased sensation C6 distribution.  No thenar atrophy interossei are strong he can make a fist.  Trace biceps weakness on the right normal on the  left no triceps weakness.  No lower extremity clonus normal heel-toe gait.  Biceps triceps reflexes are 2+ trace brachial radialis on the right 2+ on the lef Assessment/Plan Expand All Collapse All     Office Visit Note              Patient: Henry Nguyen                                    Date of Birth: 1961/01/20                                                  MRN: [id number] Visit Date: 09/25/2022                                                                     Requested by: Vickki Hearing, MD [address] [place],  [place] [address] PCP: Waldon Reining, MD     Assessment & Plan: Visit Diagnoses:  1. Spinal stenosis of cervical region   2. Other spondylosis with radiculopathy, cervical region   3. Neural foraminal stenosis of cervical spine       Plan: We discussed options including cervical epidural.  He is having significant cervical stenosis and anterolisthesis with severe foraminal stenosis C5-6.  We discussed dysphagia and dysphonia with single level cervical fusion overnight stay in the hospital use of postoperative collar use allograft and plate.  He only has single level disease at this point.  Questions were elicited and answered he understands and requests we proceed.        West Bali Lj Miyamoto, PA 05/18/2023, 8:23 AM

## 2023-05-18 NOTE — Progress Notes (Signed)
Office Visit Note   Patient: Henry Nguyen           Date of Birth: [birth year]           MRN: [id number] Visit Date: 05/18/2023              Requested by: Wilmon Pali, FNP [address] [place],  [place] [address] PCP: Wilmon Pali, FNP  Chief Complaint  Patient presents with   Neck - Follow-up    H&P      HPI: Patient is a 62 year old gentleman who presents today for his preoperative history and physical for upcoming C5-6 anterior cervical discectomy with Dr. Ophelia Charter.  He has a history of uncontrolled diabetes but is now under control.  Per his statement.  He is wearing a continuous glucose monitor.  He has cervical radiculopathy and has failed conservative treatment of the problem.  Assessment & Plan: Visit Diagnoses: Cervical radiculopathy with stenosis  Plan: Reviewed the risks of surgery with him which include but are not limited to bleeding, infection, anesthesia complications, nonunion, and the need for future surgery.  Full H&P dictated into the hospital system  Follow-Up Instructions: 1 week postop  Ortho Exam  Patient is alert, oriented, no adenopathy, well-dressed, normal affect, normal respiratory effort.  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  Slight wrist extension weakness decreased sensation C6 distribution.  No thenar atrophy interossei are strong he can make a fist.  Trace biceps weakness on the right normal on the left no triceps weakness.  No lower extremity clonus normal heel-toe gait.  Biceps triceps reflexes are 2+ trace brachial radialis on the right 2+ on the left.   Imaging: No results found. No images are attached to the encounter.  Labs: Lab Results  Component Value Date   HGBA1C 12.8 02/13/2023   HGBA1C >16 01/16/2023   HGBA1C 9.1 (H) 10/17/2022   ESRSEDRATE 8 12/02/2017     Lab Results  Component Value Date   ALBUMIN 2.9 (L) 10/23/2022   ALBUMIN 3.8 12/01/2017    Lab  Results  Component Value Date   MG 1.8 10/22/2022   MG 2.0 12/02/2017   No results found for: "VD25OH"  No results found for: "PREALBUMIN"    Latest Ref Rng & Units 11/25/2022   10:13 AM 10/22/2022    2:48 PM 10/17/2022    9:16 AM  CBC EXTENDED  WBC 4.0 - 10.5 K/uL 6.5  10.3  8.7   RBC 4.22 - 5.81 MIL/uL 4.92  5.52  5.32   Hemoglobin 13.0 - 17.0 g/dL 84.6  96.2  95.2   HCT 39.0 - 52.0 % 43.0  47.6  45.6   Platelets 150 - 400 K/uL 187  226  228      There is no height or weight on file to calculate BMI.  Orders:  No orders of the defined types were placed in this encounter.  No orders of the defined types were placed in this encounter.    Procedures: No procedures performed  Clinical Data: No additional findings.  ROS:  All other systems negative, except as noted in the HPI. Review of Systems  Objective: Vital Signs: There were no vitals taken for this visit.  Specialty Comments:  No specialty comments available.  PMFS History: Patient Active Problem List   Diagnosis Date Noted   Hypercoagulable state due to paroxysmal atrial fibrillation (HCC) 11/25/2022   Diabetes mellitus with hyperglycemia (HCC) 10/23/2022  Chronic kidney disease, stage 2 (mild) 10/23/2022   Atrial fibrillation with RVR (HCC) 10/22/2022   Spinal stenosis of cervical region 09/25/2022   Other spondylosis with radiculopathy, cervical region 09/25/2022   Neural foraminal stenosis of cervical spine 09/25/2022   S/P left knee arthroscopy 10/18/21  11/14/2021   Arthralgia of lower leg 03/13/2021   Long term (current) use of insulin (HCC) 03/13/2021   Mixed hyperlipidemia 03/13/2021   Osteoarthritis of knee 03/13/2021   Type 2 diabetes mellitus without complications (HCC) 03/13/2021   Vitamin D deficiency 03/13/2021   Folliculitis 03/13/2021   S/P right knee arthroscopy 03/01/21 03/12/2021   Acute medial meniscus tear of left knee    Special screening for malignant neoplasms, colon    Diabetic  hyperosmolar non-ketotic state (HCC) 12/01/2017   Hypertension 12/01/2017   GERD (gastroesophageal reflux disease) 12/01/2017   Hyponatremia 12/01/2017   AKI (acute kidney injury) (HCC) 12/01/2017   Rectal bleeding 12/01/2017   Past Medical History:  Diagnosis Date   Arthritis    Diabetes mellitus without complication (HCC)    GERD (gastroesophageal reflux disease)    Hypertension     Family History  Problem Relation Age of Onset   Heart attack Mother    CAD Mother    Diabetes Sister    Hypertension Sister    Colon cancer Neg Hx    Colon polyps Neg Hx     Past Surgical History:  Procedure Laterality Date   COLONOSCOPY  2010   COLONOSCOPY N/A 07/09/2018   Procedure: COLONOSCOPY;  Surgeon: West Bali, MD;  [facility];  Service: Endoscopy;  Laterality: N/A;  11:15   ESOPHAGOGASTRODUODENOSCOPY  2010   KNEE ARTHROSCOPY WITH MEDIAL MENISECTOMY Right 03/01/2021   Procedure: KNEE ARTHROSCOPY WITH MEDIAL MENISECTOMY;  Surgeon: Vickki Hearing, MD;  Location: [facility];  Service: Orthopedics;  Laterality: Right;   KNEE ARTHROSCOPY WITH MEDIAL MENISECTOMY Left 10/18/2021   Procedure: KNEE ARTHROSCOPY WITH PARTIAL MEDIAL MENISECTOMY TEAR;  Surgeon: Vickki Hearing, MD;  Location: [facility];  Service: Orthopedics;  Laterality: Left;   POLYPECTOMY  07/09/2018   Procedure: POLYPECTOMY;  Surgeon: West Bali, MD;  [facility];  Service: Endoscopy;;   Social History   Occupational History   Not on file  Tobacco Use   Smoking status: Never   Smokeless tobacco: Never  Vaping Use   Vaping status: Never Used  Substance and Sexual Activity   Alcohol use: Yes    Alcohol/week: 3.0 standard drinks of alcohol    Types: 3 Cans of beer per week   Drug use: Not Currently    Types: "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

## 2023-05-18 NOTE — H&P (View-Only) (Signed)
 Henry Nguyen is an 62 y.o. male.   Chief Complaint: cervical radiculapathy HPI: HPI 62 year old male referred to me by Dr. Fuller Nguyen for cervical spondylosis and stenosis with foraminal stenosis at C5-6 with right C6 radiculopathy.  He has had persistent problems with neck pain arm pain for several months.  He did not get relief with the prednisone disc pack.  He has been on Robaxin, Zanaflex, gabapentin.  He has noticed weakness in his arm with gripping objects.  Extreme pain when he turns his neck particularly to the right.  He is right-hand dominant states he cannot open jars has to use his left hand.  Patient is a diabetic on insulin also takes metformin glipizide and Lantus.  Last A1c was 6.7 prior to that 6.6.  Patient does take medications for hypertension on aspirin a day.  Does have hypertension and currently arthritis previous knee arthroscopies.  Denies gait disturbance.   Past Medical History:  Diagnosis Date   Arthritis    Diabetes mellitus without complication (HCC)    GERD (gastroesophageal reflux disease)    Hypertension     Past Surgical History:  Procedure Laterality Date   COLONOSCOPY  2010   COLONOSCOPY N/A 07/09/2018   Procedure: COLONOSCOPY;  Surgeon: West Bali, MD;  [facility];  Service: Endoscopy;  Laterality: N/A;  11:15   ESOPHAGOGASTRODUODENOSCOPY  2010   KNEE ARTHROSCOPY WITH MEDIAL MENISECTOMY Right 03/01/2021   Procedure: KNEE ARTHROSCOPY WITH MEDIAL MENISECTOMY;  Surgeon: Vickki Hearing, MD;  Location: [facility];  Service: Orthopedics;  Laterality: Right;   KNEE ARTHROSCOPY WITH MEDIAL MENISECTOMY Left 10/18/2021   Procedure: KNEE ARTHROSCOPY WITH PARTIAL MEDIAL MENISECTOMY TEAR;  Surgeon: Vickki Hearing, MD;  Location: [facility];  Service: Orthopedics;  Laterality: Left;   POLYPECTOMY  07/09/2018   Procedure: POLYPECTOMY;  Surgeon: West Bali, MD;  [facility];  Service: Endoscopy;;    Family History  Problem  Relation Age of Onset   Heart attack Mother    CAD Mother    Diabetes Sister    Hypertension Sister    Colon cancer Neg Hx    Colon polyps Neg Hx    Social History:  reports that he has never smoked. He has never used smokeless tobacco. He reports current alcohol use of about 3.0 standard drinks of alcohol per week. He reports that he does not currently use drugs after having used the following drugs: "Crack" cocaine.  Allergies: No Known Allergies  (Not in a hospital admission)   No results found for this or any previous visit (from the past 48 hour(s)). No results found.  Review of Systems  All other systems reviewed and are negative.   There were no vitals taken for this visit. Physical Exam   Constitutional:      Appearance: He is well-developed.  HENT:     Head: Normocephalic and atraumatic.     Right Ear: External ear normal.     Left Ear: External ear normal.  Eyes:     Pupils: Pupils are equal, round, and reactive to light.  Neck:     Thyroid: No thyromegaly.     Trachea: No tracheal deviation.  Cardiovascular:     Rate and Rhythm: Normal rate.  Pulmonary:     Effort: Pulmonary effort is normal.     Breath sounds: No wheezing.  Abdominal:     General: Bowel sounds are normal.     Palpations: Abdomen is soft.  Musculoskeletal:  Cervical back: Neck supple.  Skin:    General: Skin is warm and dry.     Capillary Refill: Capillary refill takes less than 2 seconds.  Neurological:     Mental Status: He is alert and oriented to person, place, and time.  Psychiatric:        Behavior: Behavior normal.        Thought Content: Thought content normal.        Judgment: Judgment normal.        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  Slight wrist extension weakness decreased sensation C6 distribution.  No thenar atrophy interossei are strong he can make a fist.  Trace biceps weakness on the right normal on the  left no triceps weakness.  No lower extremity clonus normal heel-toe gait.  Biceps triceps reflexes are 2+ trace brachial radialis on the right 2+ on the lef Assessment/Plan Expand All Collapse All     Office Visit Note              Patient: Henry Nguyen                                    Date of Birth: 1961/01/20                                                  MRN: [id number] Visit Date: 09/25/2022                                                                     Requested by: Vickki Hearing, MD [address] [place],  [place] [address] PCP: Waldon Reining, MD     Assessment & Plan: Visit Diagnoses:  1. Spinal stenosis of cervical region   2. Other spondylosis with radiculopathy, cervical region   3. Neural foraminal stenosis of cervical spine       Plan: We discussed options including cervical epidural.  He is having significant cervical stenosis and anterolisthesis with severe foraminal stenosis C5-6.  We discussed dysphagia and dysphonia with single level cervical fusion overnight stay in the hospital use of postoperative collar use allograft and plate.  He only has single level disease at this point.  Questions were elicited and answered he understands and requests we proceed.        West Bali Lj Miyamoto, PA 05/18/2023, 8:23 AM

## 2023-05-19 ENCOUNTER — Encounter (HOSPITAL_COMMUNITY): Payer: Self-pay

## 2023-05-19 NOTE — Pre-Procedure Instructions (Addendum)
Surgical Instructions   Your procedure is scheduled on Wednesday, December 4th. Report to [facility] Main Entrance "A" at 10:30 A.M., then check in with the Admitting office. Any questions or running late day of surgery: call [phone]  Questions prior to your surgery date: call [phone], Monday-Friday, 8am-4pm. If you experience any cold or flu symptoms such as cough, fever, chills, shortness of breath, etc. between now and your scheduled surgery, please notify us at the above number.     Remember:  Do not eat after midnight the night before your surgery  You may drink clear liquids until 09:30 AM the morning of your surgery.   Clear liquids allowed are: Water, Non-Citrus Juices (without pulp), Carbonated Beverages, Clear Tea (no milk, honey, etc.), Black Coffee Only (NO MILK, CREAM OR POWDERED CREAMER of any kind), and Gatorade.  Please complete your PRE-SURGERY G2 Drink that was provided to you by 9:30 AM, the morning of surgery.    Patient Instructions The night before surgery:  No food after midnight. ONLY clear liquids after midnight  The day of surgery (if you have diabetes): Drink ONE (1) 12 oz G2 given to you in your pre admission testing appointment by 09:30 AM the morning of surgery. Drink in one sitting. Do not sip.  This drink was given to you during your hospital  pre-op appointment visit.  Nothing else to drink after completing the  12 oz bottle of G2.         If you have questions, please contact your surgeon's office.    Take these medicines the morning of surgery with A SIP OF WATER  metoprolol succinate (TOPROL-XL)  omeprazole (PRILOSEC)   May take these medicines IF NEEDED: albuterol (VENTOLIN HFA)- bring inhaler with you on day of surgery   HOLD apixaban (ELIQUIS) 3 days prior to surgery. Last dose will be on Saturday, 11/30.  One week prior to surgery, STOP taking any Aspirin (unless otherwise instructed by your surgeon) Aleve, Naproxen,  Ibuprofen, Motrin, Advil, Goody's, BC's, all herbal medications, fish oil, and non-prescription vitamins.   WHAT DO I DO ABOUT MY DIABETES MEDICATION?  Do not take glipiZIDE (GLUCOTROL) or metFORMIN (GLUCOPHAGE) the morning of surgery.  THE NIGHT BEFORE SURGERY, take 15 units (50%) of SEMGLEE.       If your CBG is greater than 220 mg/dL, you may take  of your sliding scale (correction) dose of insulin lispro (HUMALOG).   HOW TO MANAGE YOUR DIABETES BEFORE AND AFTER SURGERY  Why is it important to control my blood sugar before and after surgery? Improving blood sugar levels before and after surgery helps healing and can limit problems. A way of improving blood sugar control is eating a healthy diet by:  Eating less sugar and carbohydrates  Increasing activity/exercise  Talking with your doctor about reaching your blood sugar goals High blood sugars (greater than 180 mg/dL) can raise your risk of infections and slow your recovery, so you will need to focus on controlling your diabetes during the weeks before surgery. Make sure that the doctor who takes care of your diabetes knows about your planned surgery including the date and location.  How do I manage my blood sugar before surgery? Check your blood sugar at least 4 times a day, starting 2 days before surgery, to make sure that the level is not too high or low.  Check your blood sugar the morning of your surgery when you wake up and every 2 hours until you get to  the Short Stay unit.  If your blood sugar is less than 70 mg/dL, you will need to treat for low blood sugar: Do not take insulin. Treat a low blood sugar (less than 70 mg/dL) with  cup of clear juice (cranberry or apple), 4 glucose tablets, OR glucose gel. Recheck blood sugar in 15 minutes after treatment (to make sure it is greater than 70 mg/dL). If your blood sugar is not greater than 70 mg/dL on recheck, call [phone] for further instructions. Report your blood  sugar to the short stay nurse when you get to Short Stay.  If you are admitted to the hospital after surgery: Your blood sugar will be checked by the staff and you will probably be given insulin after surgery (instead of oral diabetes medicines) to make sure you have good blood sugar levels. The goal for blood sugar control after surgery is 80-180 mg/dL.                       You will be asked to remove any contacts, glasses, piercing's, hearing aid's, dentures/partials prior to surgery. Please bring cases for these items if needed.     SURGICAL WAITING ROOM VISITATION Patients may have no more than 2 support people in the waiting area - these visitors may rotate.   Pre-op nurse will coordinate an appropriate time for 1 ADULT support person, who may not rotate, to accompany patient in pre-op.  Children under the age of 90 must have an adult with them who is not the patient and must remain in the main waiting area with an adult.  If the patient needs to stay at the hospital during part of their recovery, the visitor guidelines for inpatient rooms apply.  Please refer to the [facility] website for the visitor guidelines for any additional information.   If you received a COVID test during your pre-op visit  it is requested that you wear a mask when out in public, stay away from anyone that may not be feeling well and notify your surgeon if you develop symptoms. If you have been in contact with anyone that has tested positive in the last 10 days please notify you surgeon.      Pre-operative 5 CHG Bathing Instructions   You can play a key role in reducing the risk of infection after surgery. Your skin needs to be as free of germs as possible. You can reduce the number of germs on your skin by washing with CHG (chlorhexidine gluconate) soap before surgery. CHG is an antiseptic soap that kills germs and continues to kill germs even after washing.   DO NOT use if you have an allergy to  chlorhexidine/CHG or antibacterial soaps. If your skin becomes reddened or irritated, stop using the CHG and notify one of our RNs at [phone].   Please shower with the CHG soap starting 4 days before surgery using the following schedule:     Please keep in mind the following:  DO NOT shave, including legs and underarms, starting the day of your first shower.   You may shave your face at any point before/day of surgery.  Place clean sheets on your bed the day you start using CHG soap. Use a clean washcloth (not used since being washed) for each shower. DO NOT sleep with pets once you start using the CHG.   CHG Shower Instructions:  Wash your face and private area with normal soap. If you choose to wash your  hair, wash first with your normal shampoo.  After you use shampoo/soap, rinse your hair and body thoroughly to remove shampoo/soap residue.  Turn the water OFF and apply about 3 tablespoons (45 ml) of CHG soap to a CLEAN washcloth.  Apply CHG soap ONLY FROM YOUR NECK DOWN TO YOUR TOES (washing for 3-5 minutes)  DO NOT use CHG soap on face, private areas, open wounds, or sores.  Pay special attention to the area where your surgery is being performed.  If you are having back surgery, having someone wash your back for you may be helpful. Wait 2 minutes after CHG soap is applied, then you may rinse off the CHG soap.  Pat dry with a clean towel  Put on clean clothes/pajamas   If you choose to wear lotion, please use ONLY the CHG-compatible lotions on the back of this paper.   Additional instructions for the day of surgery: DO NOT APPLY any lotions, deodorants, cologne, or perfumes.   Do not bring valuables to the hospital. [facility] is not responsible for any belongings/valuables. Do not wear nail polish, gel polish, artificial nails, or any other type of covering on natural nails (fingers and toes) Do not wear jewelry or makeup Put on clean/comfortable clothes.  Please brush your  teeth.  Ask your nurse before applying any prescription medications to the skin.     CHG Compatible Lotions   Aveeno Moisturizing lotion  Cetaphil Moisturizing Cream  Cetaphil Moisturizing Lotion  Clairol Herbal Essence Moisturizing Lotion, Dry Skin  Clairol Herbal Essence Moisturizing Lotion, Extra Dry Skin  Clairol Herbal Essence Moisturizing Lotion, Normal Skin  Curel Age Defying Therapeutic Moisturizing Lotion with Alpha Hydroxy  Curel Extreme Care Body Lotion  Curel Soothing Hands Moisturizing Hand Lotion  Curel Therapeutic Moisturizing Cream, Fragrance-Free  Curel Therapeutic Moisturizing Lotion, Fragrance-Free  Curel Therapeutic Moisturizing Lotion, Original Formula  Eucerin Daily Replenishing Lotion  Eucerin Dry Skin Therapy Plus Alpha Hydroxy Crme  Eucerin Dry Skin Therapy Plus Alpha Hydroxy Lotion  Eucerin Original Crme  Eucerin Original Lotion  Eucerin Plus Crme Eucerin Plus Lotion  Eucerin TriLipid Replenishing Lotion  Keri Anti-Bacterial Hand Lotion  Keri Deep Conditioning Original Lotion Dry Skin Formula Softly Scented  Keri Deep Conditioning Original Lotion, Fragrance Free Sensitive Skin Formula  Keri Lotion Fast Absorbing Fragrance Free Sensitive Skin Formula  Keri Lotion Fast Absorbing Softly Scented Dry Skin Formula  Keri Original Lotion  Keri Skin Renewal Lotion Keri Silky Smooth Lotion  Keri Silky Smooth Sensitive Skin Lotion  Nivea Body Creamy Conditioning Oil  Nivea Body Extra Enriched Lotion  Nivea Body Original Lotion  Nivea Body Sheer Moisturizing Lotion Nivea Crme  Nivea Skin Firming Lotion  NutraDerm 30 Skin Lotion  NutraDerm Skin Lotion  NutraDerm Therapeutic Skin Cream  NutraDerm Therapeutic Skin Lotion  ProShield Protective Hand Cream  Provon moisturizing lotion  Please read over the following fact sheets that you were given.

## 2023-05-19 NOTE — Progress Notes (Signed)
PCP - Coral Ceo, FNP Cardiologist - Dr Donato Schultz  Chest x-ray - n/a EKG - 01/08/23 Stress Test - 04/20/23 ECHO LTD - 11/06/22 Cardiac Cath - n/a  ICD Pacemaker/Loop - n/a  Sleep Study -  n/a  Diabetes Type 2 Dexcom G7 System, Sensor located on upper left arm. Fasting Blood Sugar - 100-110s  CBG was 59 this morning at PAT appt.  Patient has not eaten this am.  Apple juice and peanut butter/graham crackers given during PAT appt.  After eating and drinking, CBG is now 60.  [place], [place], instructed to have patient wait additional 5 minutes and recheck blood sugar.  If CBG is rising upward, patient may be discharge home per [place], [place].  After waiting 5 minutes, CBG was 65 and patient was discharged home.  Do not take Glipizide and Metformin on the morning of surgery.  THE NIGHT BEFORE SURGERY, take 15 Units (50%) of Semglee Insulin.      If your CBG is greater than 220 mg/dL, you may take  of your sliding scale (correction) dose of Humalog Insulin.  If your blood sugar is less than 70 mg/dL, you will need to treat for low blood sugar: Treat a low blood sugar (less than 70 mg/dL) with  cup of clear juice (cranberry or apple), 4 glucose tablets, OR glucose gel. Recheck blood sugar in 15 minutes after treatment (to make sure it is greater than 70 mg/dL). If your blood sugar is not greater than 70 mg/dL on recheck, call [phone] for further instructions.  Blood Thinner Instructions:  Hold Eliquis 3 days prior to procedure.  Last dose will be on Saturday, 05/23/23.  ERAS with G2 Drink - Clear liquids til 9:30 am DOS   Anesthesia review: Yes  STOP now taking any Aspirin (unless otherwise instructed by your surgeon), Aleve, Naproxen, Ibuprofen, Motrin, Advil, Goody's, BC's, all herbal medications, fish oil, and all vitamins.   Coronavirus Screening Do you have any of the following symptoms:  Cough yes/no: No Fever (>100.46F)  yes/no: No Runny nose yes/no: No Sore throat  yes/no: No Difficulty breathing/shortness of breath  yes/no: No  Have you traveled in the last 14 days and where? yes/no: No  Patient verbalized understanding of instructions that were given to them at the PAT appointment. Patient was also instructed that they will need to review over the PAT instructions again at home before surgery.

## 2023-05-20 ENCOUNTER — Encounter (HOSPITAL_COMMUNITY): Payer: Self-pay

## 2023-05-20 ENCOUNTER — Other Ambulatory Visit: Payer: Self-pay

## 2023-05-20 ENCOUNTER — Encounter (HOSPITAL_COMMUNITY)
Admission: RE | Admit: 2023-05-20 | Discharge: 2023-05-20 | Disposition: A | Discharge status: Home / Self Care | Payer: Medicaid Other | Source: Ambulatory Visit | Attending: Orthopaedic Surgery | Admitting: Orthopaedic Surgery

## 2023-05-20 VITALS — BP 136/79 | HR 74 | Temp 98.4°F | Ht 68.0 in | Wt 214.7 lb

## 2023-05-20 DIAGNOSIS — K219 Gastro-esophageal reflux disease without esophagitis: Secondary | ICD-10-CM | POA: Diagnosis not present

## 2023-05-20 DIAGNOSIS — N1831 Chronic kidney disease, stage 3a: Secondary | ICD-10-CM | POA: Diagnosis not present

## 2023-05-20 DIAGNOSIS — Z794 Long term (current) use of insulin: Secondary | ICD-10-CM | POA: Insufficient documentation

## 2023-05-20 DIAGNOSIS — Z7901 Long term (current) use of anticoagulants: Secondary | ICD-10-CM | POA: Diagnosis not present

## 2023-05-20 DIAGNOSIS — Z01812 Encounter for preprocedural laboratory examination: Secondary | ICD-10-CM | POA: Diagnosis present

## 2023-05-20 DIAGNOSIS — Z01818 Encounter for other preprocedural examination: Secondary | ICD-10-CM

## 2023-05-20 DIAGNOSIS — Z79899 Other long term (current) drug therapy: Secondary | ICD-10-CM | POA: Insufficient documentation

## 2023-05-20 DIAGNOSIS — E119 Type 2 diabetes mellitus without complications: Secondary | ICD-10-CM | POA: Diagnosis not present

## 2023-05-20 DIAGNOSIS — I129 Hypertensive chronic kidney disease with stage 1 through stage 4 chronic kidney disease, or unspecified chronic kidney disease: Secondary | ICD-10-CM | POA: Insufficient documentation

## 2023-05-20 DIAGNOSIS — I480 Paroxysmal atrial fibrillation: Secondary | ICD-10-CM | POA: Diagnosis not present

## 2023-05-20 LAB — CBC
HCT: 47 % (ref 39.0–52.0)
Hemoglobin: 14.9 g/dL (ref 13.0–17.0)
MCH: 27.8 pg (ref 26.0–34.0)
MCHC: 31.7 g/dL (ref 30.0–36.0)
MCV: 87.7 fL (ref 80.0–100.0)
Platelets: 232 10*3/uL (ref 150–400)
RBC: 5.36 MIL/uL (ref 4.22–5.81)
RDW: 13.5 % (ref 11.5–15.5)
WBC: 7.3 10*3/uL (ref 4.0–10.5)
nRBC: 0 % (ref 0.0–0.2)

## 2023-05-20 LAB — BASIC METABOLIC PANEL
Anion gap: 12 (ref 5–15)
BUN: 12 mg/dL (ref 8–23)
CO2: 25 mmol/L (ref 22–32)
Calcium: 9 mg/dL (ref 8.9–10.3)
Chloride: 105 mmol/L (ref 98–111)
Creatinine, Ser: 1.13 mg/dL (ref 0.61–1.24)
GFR, Estimated: >60 mL/min (ref >60)
Glucose, Bld: 48 mg/dL — ABNORMAL LOW (ref 70–99)
Potassium: 3.2 mmol/L — ABNORMAL LOW (ref 3.5–5.1)
Sodium: 142 mmol/L (ref 135–145)

## 2023-05-20 LAB — HEMOGLOBIN A1C
Hgb A1c MFr Bld: 7.5 % — ABNORMAL HIGH (ref 4.8–5.6)
Mean Plasma Glucose: 168.55 mg/dL

## 2023-05-20 LAB — GLUCOSE, CAPILLARY
Glucose-Capillary: 59 mg/dL — ABNORMAL LOW (ref 70–99)
Glucose-Capillary: 60 mg/dL — ABNORMAL LOW (ref 70–99)
Glucose-Capillary: 65 mg/dL — ABNORMAL LOW (ref 70–99)

## 2023-05-20 LAB — SURGICAL PCR SCREEN
MRSA, PCR: NEGATIVE
Staphylococcus aureus: POSITIVE — AB

## 2023-05-25 NOTE — Anesthesia Preprocedure Evaluation (Signed)
Anesthesia Evaluation  Patient identified by MRN, date of birth, ID band Patient awake    Reviewed: Allergy & Precautions, NPO status , Patient's Chart, lab work & pertinent test results  Airway Mallampati: III  TM Distance: >3 FB Neck ROM: Full    Dental no notable dental hx. (+) Teeth Intact, Dental Advisory Given, Missing,    Pulmonary neg pulmonary ROS   Pulmonary exam normal breath sounds clear to auscultation       Cardiovascular hypertension, Pt. on medications + dysrhythmias Atrial Fibrillation  Rhythm:Regular Rate:Normal     Neuro/Psych  negative psych ROS   GI/Hepatic Neg liver ROS,GERD  Controlled and Medicated,,  Endo/Other  diabetes, Poorly Controlled, Type 2, Oral Hypoglycemic Agents, Insulin Dependent  A1c 9.1 Obesity BMI 32 FS 178 preop  Renal/GU Renal InsufficiencyRenal disease  negative genitourinary   Musculoskeletal  (+) Arthritis , Osteoarthritis,    Abdominal  (+) + obese  Peds  Hematology negative hematology ROS (+) Hb 14.9   Anesthesia Other Findings   Reproductive/Obstetrics negative OB ROS                             Anesthesia Physical Anesthesia Plan  ASA: 3  Anesthesia Plan: General   Post-op Pain Management: Dilaudid IV   Induction: Intravenous  PONV Risk Score and Plan: 2 and Ondansetron, Midazolam and Treatment may vary due to age or medical condition  Airway Management Planned: Oral ETT  Additional Equipment: None  Intra-op Plan:   Post-operative Plan: Extubation in OR  Informed Consent: I have reviewed the patients History and Physical, chart, labs and discussed the procedure including the risks, benefits and alternatives for the proposed anesthesia with the patient or authorized representative who has indicated his/her understanding and acceptance.     Dental advisory given  Plan Discussed with: CRNA  Anesthesia Plan Comments: (PAT  note by Antionette Poles, PA-C:  62 y.o. 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  Patient was initially scheduled to undergo surgery on 10/17/2022, however, upon presenting to short stay, he was noted to be in A-fib with RVR.  He had no awareness of atrial fibrillation and no symptoms at that time. He was admitted 5/1-2 and discharged on Eliquis 5 mg BID and diltiazem CD 120 mg daily. Echo showed EF 45% so diltiazem and lisinopril stopped. Patient started on Toprol 50 mg daily and losartan 25 mg daily. Patient is on Eliquis 5 mg BID for a CHADS2VASC score of 2.   On follow-up in the A-fib clinic on 01/08/2023 he was noted to be in normal sinus rhythm. Cardiac monitor 11/2022 showed predominantly sinus rhythm.  Stress test 04/20/2023 showed no ischemia.  He was cleared for surgery and progress note 04/28/2023 by Carlos Levering, NP stating, "Chart reviewed as part of pre-operative protocol coverage. According to the RCRI, patient has a 0.9% risk of MACE. Patient reports activity equivalent to >4.0 METS (walks for 15 minutes twice a week, performs light to moderate household activities, performs yard work including raking leaves). Given past medical history and time since last visit, based on ACC/AHA guidelines, Henry Nguyen would be at acceptable risk for the planned procedure without further cardiovascular testing. Patient was advised that if he develops new symptoms prior to surgery to contact our office to arrange a follow-up appointment.  he verbalized understanding. Per Pharm D, patient may hold Eliquis for 3  days prior to procedure.  Patient will not need bridging with Lovenox (enoxaparin) around procedure."  Preop labs reviewed, unremarkable.  EKG 01/08/2023: NSR.  Rate 81.  Nuclear stress 04/20/2023:   Findings are consistent with no ischemia. The study is intermediate risk.   No ST deviation was noted.   Left  ventricular function is abnormal. Global function is mildly reduced. Nuclear stress EF: 47%. The left ventricular ejection fraction is mildly decreased (45-54%). End diastolic cavity size is mildly enlarged. End systolic cavity size is mildly enlarged.  No reversible ischemia. LVEF 47%. Mildly dilated LV with mild global hypokinesis. This is an intermediate risk study. No prior for comparison.  Event monitor 01/06/2023: Enrollment period 11 days 13 hours  Predominant rhythm was sinus rhythm Less than 1% ventricular and supraventricular ectopy Maximum heart rate 162, minimum heart rate 42, average heart rate 88 Patient triggered episodes associated with sinus rhythm, sinus tachycardia versus atrial tachycardia  Echo 11/06/2022: 1. Left ventricular ejection fraction, by estimation, is 40 to 45%. The  left ventricle has mildly decreased function. The left ventricle  demonstrates global hypokinesis. There is mild left ventricular  hypertrophy. Left ventricular diastolic parameters  are consistent with Grade I diastolic dysfunction (impaired relaxation).  2. Right ventricular systolic function is normal. The right ventricular  size is normal.  3. The mitral valve is normal in structure. Trivial mitral valve  regurgitation. No evidence of mitral stenosis.  4. The aortic valve is normal in structure. Aortic valve regurgitation is  not visualized.    )       Anesthesia Quick Evaluation

## 2023-05-25 NOTE — Progress Notes (Signed)
Anesthesia Chart Review:  62 y.o. 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  Patient was initially scheduled to undergo surgery on 10/17/2022, however, upon presenting to short stay, he was noted to be in A-fib with RVR.  He had no awareness of atrial fibrillation and no symptoms at that time. He was admitted 5/1-2 and discharged on Eliquis 5 mg BID and diltiazem CD 120 mg daily. Echo showed EF 45% so diltiazem and lisinopril stopped. Patient started on Toprol 50 mg daily and losartan 25 mg daily. Patient is on Eliquis 5 mg BID for a CHADS2VASC score of 2.   On follow-up in the A-fib clinic on 01/08/2023 he was noted to be in normal sinus rhythm. Cardiac monitor 11/2022 showed predominantly sinus rhythm.  Stress test 04/20/2023 showed no ischemia.  He was cleared for surgery and progress note 04/28/2023 by Henry Levering, NP stating, "Chart reviewed as part of pre-operative protocol coverage. According to the RCRI, patient has a 0.9% risk of MACE. Patient reports activity equivalent to >4.0 METS (walks for 15 minutes twice a week, performs light to moderate household activities, performs yard work including raking leaves). Given past medical history and time since last visit, based on ACC/AHA guidelines, Henry Nguyen would be at acceptable risk for the planned procedure without further cardiovascular testing. Patient was advised that if he develops new symptoms prior to surgery to contact our office to arrange a follow-up appointment.  he verbalized understanding. Per Pharm D, patient may hold Eliquis for 3 days prior to procedure.  Patient will not need bridging with Lovenox (enoxaparin) around procedure."  Preop labs reviewed, unremarkable.  EKG 01/08/2023: NSR.  Rate 81.  Nuclear stress 04/20/2023:   Findings are consistent with no ischemia. The study is intermediate risk.   No ST deviation was noted.   Left ventricular  function is abnormal. Global function is mildly reduced. Nuclear stress EF: 47%. The left ventricular ejection fraction is mildly decreased (45-54%). End diastolic cavity size is mildly enlarged. End systolic cavity size is mildly enlarged.   No reversible ischemia. LVEF 47%. Mildly dilated LV with mild global hypokinesis. This is an intermediate risk study. No prior for comparison.  Event monitor 01/06/2023: Enrollment period 11 days 13 hours  Predominant rhythm was sinus rhythm Less than 1% ventricular and supraventricular ectopy Maximum heart rate 162, minimum heart rate 42, average heart rate 88 Patient triggered episodes associated with sinus rhythm, sinus tachycardia versus atrial tachycardia  Echo 11/06/2022: 1. Left ventricular ejection fraction, by estimation, is 40 to 45%. The  left ventricle has mildly decreased function. The left ventricle  demonstrates global hypokinesis. There is mild left ventricular  hypertrophy. Left ventricular diastolic parameters  are consistent with Grade I diastolic dysfunction (impaired relaxation).   2. Right ventricular systolic function is normal. The right ventricular  size is normal.   3. The mitral valve is normal in structure. Trivial mitral valve  regurgitation. No evidence of mitral stenosis.   4. The aortic valve is normal in structure. Aortic valve regurgitation is  not visualized.      Henry Nguyen [facility]/Anesthesiology Phone [phone] 05/25/2023 10:12 AM

## 2023-05-27 ENCOUNTER — Ambulatory Visit (HOSPITAL_COMMUNITY): Payer: Medicaid Other

## 2023-05-27 ENCOUNTER — Encounter (HOSPITAL_COMMUNITY)
Admission: RE | Disposition: A | Discharge status: Home / Self Care | Payer: Self-pay | Source: Home / Self Care | Attending: Orthopaedic Surgery

## 2023-05-27 ENCOUNTER — Ambulatory Visit (HOSPITAL_COMMUNITY)
Admission: RE | Admit: 2023-05-27 | Discharge: 2023-05-28 | Disposition: A | Discharge status: Home / Self Care | Payer: Medicaid Other | Attending: Orthopaedic Surgery | Admitting: Orthopaedic Surgery

## 2023-05-27 ENCOUNTER — Encounter (HOSPITAL_COMMUNITY): Payer: Self-pay | Admitting: Orthopaedic Surgery

## 2023-05-27 ENCOUNTER — Ambulatory Visit (HOSPITAL_COMMUNITY): Payer: Medicaid Other | Admitting: Physician Assistant

## 2023-05-27 ENCOUNTER — Other Ambulatory Visit: Payer: Self-pay

## 2023-05-27 ENCOUNTER — Ambulatory Visit (HOSPITAL_BASED_OUTPATIENT_CLINIC_OR_DEPARTMENT_OTHER): Payer: Medicaid Other

## 2023-05-27 DIAGNOSIS — Z794 Long term (current) use of insulin: Secondary | ICD-10-CM | POA: Insufficient documentation

## 2023-05-27 DIAGNOSIS — E119 Type 2 diabetes mellitus without complications: Secondary | ICD-10-CM | POA: Insufficient documentation

## 2023-05-27 DIAGNOSIS — Z7982 Long term (current) use of aspirin: Secondary | ICD-10-CM | POA: Diagnosis not present

## 2023-05-27 DIAGNOSIS — K219 Gastro-esophageal reflux disease without esophagitis: Secondary | ICD-10-CM | POA: Diagnosis not present

## 2023-05-27 DIAGNOSIS — E669 Obesity, unspecified: Secondary | ICD-10-CM | POA: Diagnosis not present

## 2023-05-27 DIAGNOSIS — I10 Essential (primary) hypertension: Secondary | ICD-10-CM | POA: Diagnosis not present

## 2023-05-27 DIAGNOSIS — M5412 Radiculopathy, cervical region: Secondary | ICD-10-CM

## 2023-05-27 DIAGNOSIS — M4312 Spondylolisthesis, cervical region: Secondary | ICD-10-CM | POA: Diagnosis not present

## 2023-05-27 DIAGNOSIS — Z6834 Body mass index (BMI) 34.0-34.9, adult: Secondary | ICD-10-CM | POA: Diagnosis not present

## 2023-05-27 DIAGNOSIS — E1165 Type 2 diabetes mellitus with hyperglycemia: Secondary | ICD-10-CM | POA: Insufficient documentation

## 2023-05-27 DIAGNOSIS — Z7984 Long term (current) use of oral hypoglycemic drugs: Secondary | ICD-10-CM | POA: Diagnosis not present

## 2023-05-27 DIAGNOSIS — M4722 Other spondylosis with radiculopathy, cervical region: Secondary | ICD-10-CM | POA: Diagnosis not present

## 2023-05-27 DIAGNOSIS — Z79899 Other long term (current) drug therapy: Secondary | ICD-10-CM | POA: Diagnosis not present

## 2023-05-27 DIAGNOSIS — M4802 Spinal stenosis, cervical region: Secondary | ICD-10-CM | POA: Diagnosis present

## 2023-05-27 DIAGNOSIS — Z981 Arthrodesis status: Secondary | ICD-10-CM

## 2023-05-27 HISTORY — PX: ANTERIOR CERVICAL DECOMP/DISCECTOMY FUSION: SHX1161

## 2023-05-27 LAB — GLUCOSE, CAPILLARY
Glucose-Capillary: 108 mg/dL — ABNORMAL HIGH (ref 70–99)
Glucose-Capillary: 92 mg/dL (ref 70–99)
Glucose-Capillary: 110 mg/dL — ABNORMAL HIGH (ref 70–99)
Glucose-Capillary: 103 mg/dL — ABNORMAL HIGH (ref 70–99)
Glucose-Capillary: 353 mg/dL — ABNORMAL HIGH (ref 70–99)

## 2023-05-27 SURGERY — ANTERIOR CERVICAL DECOMPRESSION/DISCECTOMY FUSION 1 LEVEL
Anesthesia: General

## 2023-05-27 MED ORDER — INSULIN ASPART 100 UNIT/ML IJ SOLN
0.0000 [IU] | Freq: Every day | INTRAMUSCULAR | Status: DC
  Administered 2023-05-27: 5 [IU] via SUBCUTANEOUS

## 2023-05-27 MED ORDER — METOPROLOL SUCCINATE ER 50 MG PO TB24
50.0000 mg | ORAL_TABLET | Freq: Every day | ORAL | Status: DC
  Administered 2023-05-28: 50 mg via ORAL
  Filled 2023-05-27: qty 1

## 2023-05-27 MED ORDER — INSULIN ASPART 100 UNIT/ML IJ SOLN
4.0000 [IU] | Freq: Three times a day (TID) | INTRAMUSCULAR | Status: DC
  Administered 2023-05-28: 4 [IU] via SUBCUTANEOUS

## 2023-05-27 MED ORDER — BUPIVACAINE-EPINEPHRINE (PF) 0.5% -1:200000 IJ SOLN
INTRAMUSCULAR | Status: AC
  Filled 2023-05-27: qty 30

## 2023-05-27 MED ORDER — SODIUM CHLORIDE 0.9% FLUSH: 3.0000 mL | Freq: Two times a day (BID) | INTRAVENOUS | Status: DC

## 2023-05-27 MED ORDER — OXYCODONE HCL 5 MG PO TABS: 5.0000 mg | ORAL_TABLET | Freq: Once | ORAL | Status: DC | PRN

## 2023-05-27 MED ORDER — ALBUMIN HUMAN 5 % IV SOLN: INTRAVENOUS | Status: DC | PRN

## 2023-05-27 MED ORDER — CHLORHEXIDINE GLUCONATE 0.12 % MT SOLN
15.0000 mL | Freq: Once | OROMUCOSAL | Status: AC
  Administered 2023-05-27: 15 mL via OROMUCOSAL
  Filled 2023-05-27: qty 15

## 2023-05-27 MED ORDER — MENTHOL 3 MG MT LOZG: 1.0000 | LOZENGE | OROMUCOSAL | Status: DC | PRN

## 2023-05-27 MED ORDER — POTASSIUM CHLORIDE IN NACL 20-0.45 MEQ/L-% IV SOLN
INTRAVENOUS | Status: AC
  Filled 2023-05-27: qty 1000

## 2023-05-27 MED ORDER — OXYCODONE HCL 5 MG PO TABS
10.0000 mg | ORAL_TABLET | ORAL | Status: DC | PRN
Start: 2023-05-27 — End: 2023-05-28
  Administered 2023-05-27 (×2): 10 mg via ORAL
  Filled 2023-05-27 (×2): qty 2

## 2023-05-27 MED ORDER — HYDROMORPHONE HCL 1 MG/ML IJ SOLN: 0.2500 mg | INTRAMUSCULAR | Status: DC | PRN

## 2023-05-27 MED ORDER — FUROSEMIDE 20 MG PO TABS
20.0000 mg | ORAL_TABLET | Freq: Every day | ORAL | Status: DC | PRN
Start: 2023-05-27 — End: 2023-05-28

## 2023-05-27 MED ORDER — 0.9 % SODIUM CHLORIDE (POUR BTL) OPTIME
TOPICAL | Status: DC | PRN
  Administered 2023-05-27: 1000 mL

## 2023-05-27 MED ORDER — SUGAMMADEX SODIUM 200 MG/2ML IV SOLN
INTRAVENOUS | Status: DC | PRN
  Administered 2023-05-27: 400 mg via INTRAVENOUS

## 2023-05-27 MED ORDER — CEFAZOLIN SODIUM-DEXTROSE 2-4 GM/100ML-% IV SOLN
2.0000 g | INTRAVENOUS | Status: AC
Start: 2023-05-27 — End: 2023-05-27
  Administered 2023-05-27: 2 g via INTRAVENOUS
  Filled 2023-05-27: qty 100

## 2023-05-27 MED ORDER — EPHEDRINE 5 MG/ML INJ
INTRAVENOUS | Status: AC
  Filled 2023-05-27: qty 5

## 2023-05-27 MED ORDER — BUPIVACAINE-EPINEPHRINE 0.5% -1:200000 IJ SOLN
INTRAMUSCULAR | Status: DC | PRN
  Administered 2023-05-27: 30 mL
  Administered 2023-05-27: 6 mL

## 2023-05-27 MED ORDER — PROPOFOL 10 MG/ML IV BOLUS
INTRAVENOUS | Status: AC
  Filled 2023-05-27: qty 20

## 2023-05-27 MED ORDER — METHOCARBAMOL 1000 MG/10ML IJ SOLN: 500.0000 mg | Freq: Four times a day (QID) | INTRAMUSCULAR | Status: DC | PRN

## 2023-05-27 MED ORDER — EPHEDRINE SULFATE-NACL 50-0.9 MG/10ML-% IV SOSY
PREFILLED_SYRINGE | INTRAVENOUS | Status: DC | PRN
  Administered 2023-05-27 (×4): 5 mg via INTRAVENOUS

## 2023-05-27 MED ORDER — DEXAMETHASONE SODIUM PHOSPHATE 10 MG/ML IJ SOLN
INTRAMUSCULAR | Status: AC
Start: 2023-05-27 — End: ?
  Filled 2023-05-27: qty 1

## 2023-05-27 MED ORDER — ALBUTEROL SULFATE (2.5 MG/3ML) 0.083% IN NEBU: 2.5000 mg | INHALATION_SOLUTION | Freq: Four times a day (QID) | RESPIRATORY_TRACT | Status: DC | PRN

## 2023-05-27 MED ORDER — LIDOCAINE 2% (20 MG/ML) 5 ML SYRINGE
INTRAMUSCULAR | Status: DC | PRN
  Administered 2023-05-27: 100 mg via INTRAVENOUS

## 2023-05-27 MED ORDER — PHENOL 1.4 % MT LIQD: 1.0000 | OROMUCOSAL | Status: DC | PRN

## 2023-05-27 MED ORDER — METFORMIN HCL 500 MG PO TABS
500.0000 mg | ORAL_TABLET | Freq: Two times a day (BID) | ORAL | Status: DC
  Administered 2023-05-27 – 2023-05-28 (×2): 500 mg via ORAL
  Filled 2023-05-27 (×2): qty 1

## 2023-05-27 MED ORDER — OXYCODONE HCL 5 MG PO TABS
5.0000 mg | ORAL_TABLET | ORAL | Status: DC | PRN
  Administered 2023-05-28: 5 mg via ORAL
  Filled 2023-05-27: qty 1

## 2023-05-27 MED ORDER — MIDAZOLAM HCL 2 MG/2ML IJ SOLN
INTRAMUSCULAR | Status: DC | PRN
  Administered 2023-05-27: 1 mg via INTRAVENOUS

## 2023-05-27 MED ORDER — SODIUM CHLORIDE 0.9 % IV SOLN: 12.5000 mg | INTRAVENOUS | Status: DC | PRN

## 2023-05-27 MED ORDER — MIDAZOLAM HCL 2 MG/2ML IJ SOLN
INTRAMUSCULAR | Status: AC
Start: 2023-05-27 — End: ?
  Filled 2023-05-27: qty 2

## 2023-05-27 MED ORDER — ONDANSETRON HCL 4 MG PO TABS: 4.0000 mg | ORAL_TABLET | Freq: Four times a day (QID) | ORAL | Status: DC | PRN

## 2023-05-27 MED ORDER — OXYCODONE HCL 5 MG/5ML PO SOLN: 5.0000 mg | Freq: Once | ORAL | Status: DC | PRN

## 2023-05-27 MED ORDER — PANTOPRAZOLE SODIUM 40 MG PO TBEC
40.0000 mg | DELAYED_RELEASE_TABLET | Freq: Every day | ORAL | Status: DC
  Administered 2023-05-28: 40 mg via ORAL
  Filled 2023-05-27: qty 1

## 2023-05-27 MED ORDER — HYDROMORPHONE HCL 1 MG/ML IJ SOLN: 1.0000 mg | INTRAMUSCULAR | Status: DC | PRN

## 2023-05-27 MED ORDER — LIDOCAINE 2% (20 MG/ML) 5 ML SYRINGE
INTRAMUSCULAR | Status: AC
  Filled 2023-05-27: qty 5

## 2023-05-27 MED ORDER — SODIUM CHLORIDE 0.9 % IV SOLN: 250.0000 mL | INTRAVENOUS | Status: DC

## 2023-05-27 MED ORDER — SODIUM CHLORIDE 0.9% FLUSH: 3.0000 mL | INTRAVENOUS | Status: DC | PRN

## 2023-05-27 MED ORDER — ACETAMINOPHEN 650 MG RE SUPP: 650.0000 mg | RECTAL | Status: DC | PRN

## 2023-05-27 MED ORDER — GLIPIZIDE 5 MG PO TABS
10.0000 mg | ORAL_TABLET | Freq: Every day | ORAL | Status: DC
  Administered 2023-05-28: 10 mg via ORAL
  Filled 2023-05-27: qty 2

## 2023-05-27 MED ORDER — LOSARTAN POTASSIUM 50 MG PO TABS
50.0000 mg | ORAL_TABLET | Freq: Every day | ORAL | Status: DC
  Administered 2023-05-27 – 2023-05-28 (×2): 50 mg via ORAL
  Filled 2023-05-27 (×2): qty 1

## 2023-05-27 MED ORDER — INSULIN GLARGINE-YFGN 100 UNIT/ML ~~LOC~~ SOLN
30.0000 [IU] | Freq: Every day | SUBCUTANEOUS | Status: DC
  Administered 2023-05-27: 30 [IU] via SUBCUTANEOUS
  Filled 2023-05-27 (×2): qty 0.3

## 2023-05-27 MED ORDER — ACETAMINOPHEN 325 MG PO TABS: 650.0000 mg | ORAL_TABLET | ORAL | Status: DC | PRN

## 2023-05-27 MED ORDER — AMISULPRIDE (ANTIEMETIC) 5 MG/2ML IV SOLN: 10.0000 mg | Freq: Once | INTRAVENOUS | Status: DC | PRN

## 2023-05-27 MED ORDER — ONDANSETRON HCL 4 MG/2ML IJ SOLN: 4.0000 mg | Freq: Four times a day (QID) | INTRAMUSCULAR | Status: DC | PRN

## 2023-05-27 MED ORDER — FENTANYL CITRATE (PF) 250 MCG/5ML IJ SOLN
INTRAMUSCULAR | Status: AC
  Filled 2023-05-27: qty 5

## 2023-05-27 MED ORDER — INSULIN ASPART 100 UNIT/ML IJ SOLN
0.0000 [IU] | Freq: Three times a day (TID) | INTRAMUSCULAR | Status: DC
  Administered 2023-05-28: 3 [IU] via SUBCUTANEOUS

## 2023-05-27 MED ORDER — ONDANSETRON HCL 4 MG/2ML IJ SOLN
INTRAMUSCULAR | Status: DC | PRN
  Administered 2023-05-27: 4 mg via INTRAVENOUS

## 2023-05-27 MED ORDER — PHENYLEPHRINE HCL-NACL 20-0.9 MG/250ML-% IV SOLN
INTRAVENOUS | Status: DC | PRN
  Administered 2023-05-27: 25 ug/min via INTRAVENOUS

## 2023-05-27 MED ORDER — ORAL CARE MOUTH RINSE: 15.0000 mL | Freq: Once | OROMUCOSAL | Status: AC

## 2023-05-27 MED ORDER — METHOCARBAMOL 500 MG PO TABS
500.0000 mg | ORAL_TABLET | Freq: Four times a day (QID) | ORAL | Status: DC | PRN
  Administered 2023-05-27: 500 mg via ORAL
  Filled 2023-05-27: qty 1

## 2023-05-27 MED ORDER — DEXAMETHASONE SODIUM PHOSPHATE 10 MG/ML IJ SOLN
INTRAMUSCULAR | Status: DC | PRN
  Administered 2023-05-27: 5 mg via INTRAVENOUS

## 2023-05-27 MED ORDER — ONDANSETRON HCL 4 MG/2ML IJ SOLN
INTRAMUSCULAR | Status: AC
  Filled 2023-05-27: qty 2

## 2023-05-27 MED ORDER — ROCURONIUM BROMIDE 10 MG/ML (PF) SYRINGE
PREFILLED_SYRINGE | INTRAVENOUS | Status: AC
  Filled 2023-05-27: qty 10

## 2023-05-27 MED ORDER — LACTATED RINGERS IV SOLN: INTRAVENOUS | Status: DC

## 2023-05-27 MED ORDER — FENTANYL CITRATE (PF) 250 MCG/5ML IJ SOLN
INTRAMUSCULAR | Status: DC | PRN
  Administered 2023-05-27: 100 ug via INTRAVENOUS
  Administered 2023-05-27: 50 ug via INTRAVENOUS

## 2023-05-27 MED ORDER — ROCURONIUM BROMIDE 10 MG/ML (PF) SYRINGE
PREFILLED_SYRINGE | INTRAVENOUS | Status: DC | PRN
  Administered 2023-05-27: 100 mg via INTRAVENOUS

## 2023-05-27 MED ORDER — PROPOFOL 10 MG/ML IV BOLUS
INTRAVENOUS | Status: DC | PRN
  Administered 2023-05-27: 150 mg via INTRAVENOUS

## 2023-05-27 SURGICAL SUPPLY — 49 items
BAG COUNTER SPONGE SURGICOUNT (BAG) ×1 IMPLANT
BENZOIN TINCTURE PRP APPL 2/3 (GAUZE/BANDAGES/DRESSINGS) ×1 IMPLANT
BIT DRILL SPINE QC 14 (BIT) IMPLANT
BLADE CLIPPER SURG (BLADE) IMPLANT
BONE CC-ACS 11X14X7 6D (Bone Implant) ×1 IMPLANT
BUR ROUND FLUTED 4 SOFT TCH (BURR) ×1 IMPLANT
CHIPS BONE CANC-ACS11X14X7 6D (Bone Implant) IMPLANT
COLLAR CERV LO CONTOUR FIRM DE (SOFTGOODS) ×1 IMPLANT
COVER MAYO STAND STRL (DRAPES) ×1 IMPLANT
COVER SURGICAL LIGHT HANDLE (MISCELLANEOUS) ×1 IMPLANT
DRAPE C-ARM 42X72 X-RAY (DRAPES) ×1 IMPLANT
DRAPE HALF SHEET 40X57 (DRAPES) ×1 IMPLANT
DRAPE MICROSCOPE SLANT 54X150 (MISCELLANEOUS) ×1 IMPLANT
DURAPREP 6ML APPLICATOR 50/CS (WOUND CARE) ×1 IMPLANT
ELECT COATED BLADE 2.86 ST (ELECTRODE) ×1 IMPLANT
ELECT REM PT RETURN 9FT ADLT (ELECTROSURGICAL) ×1
ELECTRODE REM PT RTRN 9FT ADLT (ELECTROSURGICAL) ×1 IMPLANT
EVACUATOR 1/8 PVC DRAIN (DRAIN) ×1 IMPLANT
GAUZE SPONGE 4X4 12PLY STRL (GAUZE/BANDAGES/DRESSINGS) ×1 IMPLANT
GLOVE BIOGEL PI IND STRL 8 (GLOVE) ×2 IMPLANT
GLOVE ORTHO TXT STRL SZ7.5 (GLOVE) ×2 IMPLANT
GOWN STRL REUS W/ TWL LRG LVL3 (GOWN DISPOSABLE) ×1 IMPLANT
GOWN STRL REUS W/ TWL XL LVL3 (GOWN DISPOSABLE) ×1 IMPLANT
GOWN STRL REUS W/TWL 2XL LVL3 (GOWN DISPOSABLE) ×1 IMPLANT
HALTER HD/CHIN CERV TRACTION D (MISCELLANEOUS) ×1 IMPLANT
KIT BASIN OR (CUSTOM PROCEDURE TRAY) ×1 IMPLANT
KIT TURNOVER KIT B (KITS) ×1 IMPLANT
MANIFOLD NEPTUNE II (INSTRUMENTS) ×1 IMPLANT
NDL 25GX 5/8IN NON SAFETY (NEEDLE) ×1 IMPLANT
NEEDLE 25GX 5/8IN NON SAFETY (NEEDLE) ×1
NS IRRIG 1000ML POUR BTL (IV SOLUTION) ×1 IMPLANT
PACK ORTHO CERVICAL (CUSTOM PROCEDURE TRAY) ×1 IMPLANT
PAD ARMBOARD 7.5X6 YLW CONV (MISCELLANEOUS) ×2 IMPLANT
PATTIES SURGICAL .5 X.5 (GAUZE/BANDAGES/DRESSINGS) IMPLANT
PIN TEMP FIXATION KIRSCHNER (EXFIX) IMPLANT
PLATE ANT CERV XTEND 1 LV 14 (Plate) IMPLANT
POSITIONER HEAD DONUT 9IN (MISCELLANEOUS) ×1 IMPLANT
SCREW XTD VAR 4.2 SELF TAP (Screw) IMPLANT
STRIP CLOSURE SKIN 1/2X4 (GAUZE/BANDAGES/DRESSINGS) ×1 IMPLANT
SURGIFLO W/THROMBIN 8M KIT (HEMOSTASIS) IMPLANT
SUT BONE WAX W31G (SUTURE) ×1 IMPLANT
SUT VIC AB 3-0 PS2 18XBRD (SUTURE) ×1 IMPLANT
SUT VIC AB 4-0 PS2 27 (SUTURE) ×1 IMPLANT
SYR 10ML LL (SYRINGE) IMPLANT
SYR BULB EAR ULCER 3OZ GRN STR (SYRINGE) ×1 IMPLANT
TAPE CLOTH 4X10 WHT NS (GAUZE/BANDAGES/DRESSINGS) IMPLANT
TOWEL GREEN STERILE (TOWEL DISPOSABLE) ×1 IMPLANT
TOWEL GREEN STERILE FF (TOWEL DISPOSABLE) ×1 IMPLANT
WATER STERILE IRR 1000ML POUR (IV SOLUTION) ×1 IMPLANT

## 2023-05-27 NOTE — Interval H&P Note (Signed)
History and Physical Interval Note:  05/27/2023 12:30 PM  Henry Nguyen  has presented today for surgery, with the diagnosis of C5-6 spondylosis, stenosis.  The various methods of treatment have been discussed with the patient and family. After consideration of risks, benefits and other options for treatment, the patient has consented to  Procedure(s) with comments: C5-6 ANTERIOR CERVICAL DISCECTOMY FUSION, ALLOGRAFT, PLATE (N/A) - Needs RNFA as a surgical intervention.  The patient's history has been reviewed, patient examined, no change in status, stable for surgery.  I have reviewed the patient's chart and labs.  Questions were answered to the patient's satisfaction.     Eldred Manges

## 2023-05-27 NOTE — Progress Notes (Signed)
Orthopedic Tech Progress Note Patient Details:  Henry Nguyen 04/14/1961 [id number]  Delivered extra soft collar to pt. Left at bedside as instructed.  Ortho Devices Type of Ortho Device: Soft collar Ortho Device/Splint Interventions: Theotis Burrow 05/27/2023, 3:44 PM

## 2023-05-27 NOTE — Anesthesia Postprocedure Evaluation (Signed)
Anesthesia Post Note  Patient: Henry Nguyen  Procedure(s) Performed: C5-6 ANTERIOR CERVICAL DISCECTOMY FUSION, ALLOGRAFT, PLATE     Patient location during evaluation: PACU Anesthesia Type: General Level of consciousness: awake and alert Pain management: pain level controlled Vital Signs Assessment: post-procedure vital signs reviewed and stable Respiratory status: spontaneous breathing, nonlabored ventilation and respiratory function stable Cardiovascular status: blood pressure returned to baseline and stable Postop Assessment: no apparent nausea or vomiting Anesthetic complications: no   No notable events documented.  Last Vitals:  Vitals:   05/27/23 1529 05/27/23 1543  BP: (!) 145/93 (!) 153/94  Pulse: 71 77  Resp: 17 20  Temp: 36.8 C 36.6 C  SpO2: 94% 97%    Last Pain:  Vitals:   05/27/23 1515  TempSrc:   PainSc: 0-No pain                 Lowella Curb

## 2023-05-27 NOTE — Anesthesia Procedure Notes (Cosign Needed)
Procedure Name: Intubation Date/Time: 05/27/2023 12:54 PM  Performed by: Lowella Curb, MDPre-anesthesia Checklist: Patient identified, Emergency Drugs available, Suction available and Patient being monitored Patient Re-evaluated:Patient Re-evaluated prior to induction Oxygen Delivery Method: Circle system utilized Preoxygenation: Pre-oxygenation with 100% oxygen Induction Type: IV induction Ventilation: Mask ventilation without difficulty Laryngoscope Size: Glidescope and 4 Grade View: Grade I Tube type: Oral Tube size: 7.5 mm Number of attempts: 1 Airway Equipment and Method: Stylet and Oral airway Placement Confirmation: ETT inserted through vocal cords under direct vision, positive ETCO2 and breath sounds checked- equal and bilateral Secured at: 22 cm Tube secured with: Tape Dental Injury: Teeth and Oropharynx as per pre-operative assessment  Comments: Inserted by Hassel Neth

## 2023-05-27 NOTE — Transfer of Care (Signed)
Immediate Anesthesia Transfer of Care Note  Patient: Henry Nguyen  Procedure(s) Performed: C5-6 ANTERIOR CERVICAL DISCECTOMY FUSION, ALLOGRAFT, PLATE  Patient Location: PACU  Anesthesia Type:General  Level of Consciousness: awake, drowsy, and patient cooperative  Airway & Oxygen Therapy: Patient Spontanous Breathing and Patient connected to face mask oxygen  Post-op Assessment: Report given to RN and Post -op Vital signs reviewed and stable  Post vital signs: Reviewed and stable  Last Vitals:  Vitals Value Taken Time  BP 136/83 1450  Temp    Pulse 70 1448  Resp 16 1448  SpO2 100% 1448    Last Pain:  Vitals:   05/27/23 1047  TempSrc:   PainSc: 0-No pain         Complications: No notable events documented.

## 2023-05-27 NOTE — Interval H&P Note (Signed)
History and Physical Interval Note:  05/27/2023 12:31 PM  Henry Nguyen  has presented today for surgery, with the diagnosis of C5-6 spondylosis, stenosis.  The various methods of treatment have been discussed with the patient and family. After consideration of risks, benefits and other options for treatment, the patient has consented to  Procedure(s) with comments: C5-6 ANTERIOR CERVICAL DISCECTOMY FUSION, ALLOGRAFT, PLATE (N/A) - Needs RNFA as a surgical intervention.  The patient's history has been reviewed, patient examined, no change in status, stable for surgery.  I have reviewed the patient's chart and labs.  Questions were answered to the patient's satisfaction.     Eldred Manges

## 2023-05-27 NOTE — Op Note (Signed)
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  Surgeon: Annell Greening, MD  Assistant: Georgann Housekeeper, RNFA medically necessary and present for the entire procedure  EBL approximately 100 cc  Drains 1 Hemovac neck  Anesthesia General Orotracheal +6 cc Marcaine skin local  Implants:mplants  BONE CC-ACS 11X14X7 6D - [id number]  Inventory Item: BONE CC-ACS 11X14X7 6D Serial no.: [id number] Model/Cat no.: [id number]  Implant name: BONE CC-ACS 11X14X7 6D - [id number] Laterality: N/A Area: Cervical Level 5-6  Manufacturer: [employer] CERVICAL BONE SPACERS Date of Manufacture:   Action: Implanted Number Used: 1   Device Identifier: Device Identifier Type:   PLATE ANT CERV XTEND 1 LV 14 - [id number]  Inventory Item: PLATE ANT CERV XTEND 1 LV 14 Serial no.: Model/Cat no.: [id number]  Implant name: PLATE ANT CERV XTEND 1 LV 14 - [id number] Laterality: N/A Area: Cervical Level 5-6  Manufacturer: [employer] Date of Manufacture:   Action: Implanted Number Used: 1   Device Identifier: Device Identifier Type:   SCREW XTD VAR 4.2 SELF TAP - [id number]   Inventory Item: SCREW XTD VAR 4.2 SELF TAP Serial no.: Model/Cat no.: [id number]  Implant name: SCREW XTD VAR 4.2 SELF TAP - [id number] Laterality: N/A Area: Cervical Level 5-6  Manufacturer: [employer] Date of Manufacture:   Action: Implanted Number Used: 4   Device Identifier: Device Identifier Type:     Procedure after standard prepping and draping without altered traction applied without weight arms tucked to the side wrist restraints pads over the ulnar nerve neck was prepped with DuraPrep there is squared with towels sterile skin marker Betadine Steri-Drape sterile Mayo stand at the head and thyroid sheets and draped.  Timeout procedure was completed 2 g Ancef was given prophylactically.  Incision was made based on palpable landmarks starting at  the midline extending to the left platysma is divided in line with the fibers and blunt dissection down after coagulating 1 very small vein down to the longus Coley.  Prominent spur at C5-6 was noted and a short 25 needle was placed with a straight hemostat and sterilely draped C arm confirmed the appropriate level.  Level was marked by removing a big chunk of the disc.  Self-retaining Clara retractors were placed with blades cephalad caudad teeth plates left and right underneath the longus Coley muscle.  Operative microscope was draped brought in discectomy was performed progressing back to posterior longitudinal ligaments with particular attention to the right side where the foraminal was tighter than the left with decompression stripping of the uncovertebral joints.  6 spacer was somewhat tight 7 gave better distraction and was selected for 7 graft.  Traction pulled by the CRNA countersunk 2 mm checked under C arm good position.  14 mm plate was selected held with the silver prong in the inferior prong hole and then screws were placed checked intermittently with C arm plate was adjusted good position and final spot pictures are taken followed by the tiny locking screwdriver locking all screws to prevent backout.  Operative field was dry.  Some Surgiflo been used in the epidural space for some mild bone oozing.  Bone wax had been used anteriorly over the anterior aspect of vertebral bodies.  Hemovacs placed on the left side and out technique using the trocar.  Platysma closed with 304 0 Vicryl subcuticular closure tincture benzoin Steri-Strips Tegaderm marked infiltration 6 cc.  Steri-Strips 4 x 4's tape and soft cervical collar  with collar extender.  Instrument count needle count was correct.  Patient tolerated procedure well transferred care room in stable condition.

## 2023-05-28 ENCOUNTER — Encounter (HOSPITAL_COMMUNITY): Payer: Self-pay | Admitting: Orthopaedic Surgery

## 2023-05-28 DIAGNOSIS — M4722 Other spondylosis with radiculopathy, cervical region: Secondary | ICD-10-CM | POA: Diagnosis not present

## 2023-05-28 LAB — GLUCOSE, CAPILLARY: Glucose-Capillary: 169 mg/dL — ABNORMAL HIGH (ref 70–99)

## 2023-05-28 MED ORDER — METHOCARBAMOL 500 MG PO TABS: 500.0000 mg | ORAL_TABLET | Freq: Four times a day (QID) | ORAL | 0 refills | Status: AC | PRN

## 2023-05-28 MED ORDER — OXYCODONE-ACETAMINOPHEN 5-325 MG PO TABS: 1.0000 | ORAL_TABLET | Freq: Four times a day (QID) | ORAL | 0 refills | Status: DC | PRN

## 2023-05-28 NOTE — Evaluation (Signed)
Physical Therapy Evaluation and Discharge  Patient Details Name: Henry Nguyen MRN: [id number] DOB: 11-27-1960 Today's Date: 05/28/2023  History of Present Illness  The pt is a 62 yo male presenting 12/4 for ACDF C5-6.PMH includes: arthritis, DM II, and HTN.  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 He has arranged additional assistance to help with physical aspects of caring for his mother after surgery. The pt was able to demo all sit-stand transfers, ambulation, and bed mobility without need for physical assistance and good adherence to spinal precautions. He was educated on progressive walking program, safety with mobility at home, cervical precautions, and safe exercises to maintain mobility after surgery. Pt verbalized understanding of all education, no further acute PT needs identified. Pt can return home with family support when medically cleared.         If plan is discharge home, recommend the following: Assist for transportation   Can travel by private vehicle        Equipment Recommendations None recommended by PT  Recommendations for Other Services       Functional Status Assessment Patient has had a recent decline in their functional status and demonstrates the ability to make significant improvements in function in a reasonable and predictable amount of time.     Precautions / Restrictions Precautions Precautions: Cervical Precaution Booklet Issued: Yes (comment) Precaution Comments: discussed verbally, pt able to apply to mobility Required Braces or Orthoses: Cervical Brace Cervical Brace: Soft collar;At all times Restrictions Weight Bearing Restrictions: No      Mobility  Bed Mobility Overal bed mobility: Needs Assistance Bed Mobility: Supine to Sit     Supine to sit: Supervision     General bed mobility  comments: cues for log roll, pt verbalized understanding    Transfers Overall transfer level: Needs assistance Equipment used: None Transfers: Sit to/from Stand Sit to Stand: Independent           General transfer comment: no assistance or DME, good stability with stand    Ambulation/Gait Ambulation/Gait assistance: Supervision, Independent Gait Distance (Feet): 300 Feet Assistive device: None Gait Pattern/deviations: WFL(Within Functional Limits) Gait velocity: 0.71 m/s Gait velocity interpretation: 1.31 - 2.62 ft/sec, indicative of limited community ambulator          Balance Overall balance assessment: Needs assistance Sitting-balance support: No upper extremity supported Sitting balance-Leahy Scale: Normal     Standing balance support: No upper extremity supported Standing balance-Leahy Scale: Good       Tandem Stance - Right Leg: 1 Tandem Stance - Left Leg: 1 Rhomberg - Eyes Opened: 15 Rhomberg - Eyes Closed: 15 (increased sway)                 Pertinent Vitals/Pain Pain Assessment Pain Assessment: Faces Faces Pain Scale: Hurts a little bit Pain Location: throat Pain Descriptors / Indicators: Discomfort Pain Intervention(s): Limited activity within patient's tolerance, Monitored during session    Home Living Family/patient expects to be discharged to:: Private residence Living Arrangements: Parent Available Help at Discharge: Family;Available 24 hours/day Type of Home: House Home Access: Ramped entrance       Home Layout: One level Home Equipment: Agricultural consultant (2 wheels);Cane - single point;Grab bars - toilet;Grab bars - tub/shower Additional Comments: Pt is care taker of his mom; pt's sister and son in law are planning on staying to assist  with pt and mom    Prior Function Prior Level of Function : Independent/Modified Independent             Mobility Comments: no AD ADLs Comments: indep, drives     Extremity/Trunk Assessment    Upper Extremity Assessment Upper Extremity Assessment: Overall WFL for tasks assessed;Defer to OT evaluation    Lower Extremity Assessment Lower Extremity Assessment: Overall WFL for tasks assessed    Cervical / Trunk Assessment Cervical / Trunk Assessment: Neck Surgery  Communication   Communication Communication: No apparent difficulties  Cognition Arousal: Alert Behavior During Therapy: WFL for tasks assessed/performed Overall Cognitive Status: Within Functional Limits for tasks assessed                                 General Comments: great unerstanding of cervical precautions        General Comments General comments (skin integrity, edema, etc.): VSS, goo dunderstanding of all precautions and safety    Exercises Other Exercises Other Exercises: educated on progressive walking program and repeated sit-stand   Assessment/Plan    PT Assessment Patient does not need any further PT services  PT Problem List Decreased activity tolerance;Decreased balance       PT Treatment Interventions DME instruction;Gait training;Functional mobility training    PT Goals (Current goals can be found in the Care Plan section)  Acute Rehab PT Goals Patient Stated Goal: return home PT Goal Formulation: All assessment and education complete, DC therapy Time For Goal Achievement: 06/11/23 Potential to Achieve Goals: Good     AM-PAC PT "6 Clicks" Mobility  Outcome Measure Help needed turning from your back to your side while in a flat bed without using bedrails?: None Help needed moving from lying on your back to sitting on the side of a flat bed without using bedrails?: None Help needed moving to and from a bed to a chair (including a wheelchair)?: A Little Help needed standing up from a chair using your arms (e.g., wheelchair or bedside chair)?: A Little Help needed to walk in hospital room?: A Little Help needed climbing 3-5 steps with a railing? : A Little 6 Click  Score: 20    End of Session Equipment Utilized During Treatment: Gait belt Activity Tolerance: Patient tolerated treatment well Patient left: in bed;with call bell/phone within reach Nurse Communication: Mobility status PT Visit Diagnosis: Unsteadiness on feet (R26.81)    Time: 4782-9562 PT Time Calculation (min) (ACUTE ONLY): 18 min   Charges:   PT Evaluation $PT Eval Low Complexity: 1 Low   PT General Charges $$ ACUTE PT VISIT: 1 Visit         Vickki Muff, PT, DPT   Acute Rehabilitation Department Office [phone] Secure Chat Communication Preferred  Ronnie Derby 05/28/2023, 9:54 AM

## 2023-05-28 NOTE — Evaluation (Signed)
Occupational Therapy Evaluation Patient Details Name: Henry Nguyen MRN: [id number] DOB: 1961/01/30 Today's Date: 05/28/2023   History of Present Illness The pt is a 62 yo male presenting 12/4 for ACDF C5-6.PMH includes: arthritis, DM II, and HTN.   Clinical Impression   Henry Nguyen was evaluated s/p the above admission list. He is indep and takes care of his mother at baseline. Pt has family coming to town to assist with pt and care giving needs at discharge. Upon evaluation the pt was limited by cervical precautions, mild surgical pain and limited activity tolerance. Overall he demonstrated mod I ability to complete ADLs and mobility without AD/DME. Provided cues and education on spinal precautions and compensatory techniques throughout, handout provided and pt demonstrated great recall during ADLs and mobility. Pt does not require further acute OT services. Recommend d/c home with support of family.           If plan is discharge home, recommend the following: Assist for transportation;Assistance with cooking/housework    Functional Status Assessment  Patient has had a recent decline in their functional status and demonstrates the ability to make significant improvements in function in a reasonable and predictable amount of time.  Equipment Recommendations  None recommended by OT       Precautions / Restrictions Precautions Precautions: Cervical Precaution Booklet Issued: Yes (comment) Required Braces or Orthoses: Cervical Brace Cervical Brace: Soft collar;At all times Restrictions Weight Bearing Restrictions: No      Mobility Bed Mobility Overal bed mobility: Needs Assistance             General bed mobility comments: OOB upon arrival, pt with great verbal recall of log roll    Transfers Overall transfer level: Needs assistance Equipment used: None Transfers: Sit to/from Stand Sit to Stand: Independent                  Balance Overall balance assessment:  No apparent balance deficits (not formally assessed)             ADL either performed or assessed with clinical judgement   ADL Overall ADL's : Modified independent               General ADL Comments: after review of spinal precautions, pt demonstrated mod I ability to complete all ADLs without AD/DME     Vision Baseline Vision/History: 1 Wears glasses Vision Assessment?: No apparent visual deficits     Perception Perception: Within Functional Limits       Praxis Praxis: WFL       Pertinent Vitals/Pain Pain Assessment Pain Assessment: Faces Faces Pain Scale: Hurts a little bit Pain Location: throat Pain Descriptors / Indicators: Discomfort Pain Intervention(s): Monitored during session     Extremity/Trunk Assessment Upper Extremity Assessment Upper Extremity Assessment: Overall WFL for tasks assessed   Lower Extremity Assessment Lower Extremity Assessment: Defer to PT evaluation   Cervical / Trunk Assessment Cervical / Trunk Assessment: Neck Surgery   Communication Communication Communication: No apparent difficulties   Cognition Arousal: Alert Behavior During Therapy: WFL for tasks assessed/performed Overall Cognitive Status: Within Functional Limits for tasks assessed           General Comments: great unerstanding of cervical precautions     General Comments  VSS, great understanding of precautions            Home Living Family/patient expects to be discharged to:: Private residence Living Arrangements: Parent Available Help at Discharge: Family;Available 24 hours/day Type of Home: House  Home Layout: One level     Bathroom Shower/Tub: Chief Strategy Officer: Handicapped height     Home Equipment: Agricultural consultant (2 wheels);Cane - single point;Grab bars - toilet;Grab bars - tub/shower   Additional Comments: Pt is care taker of his mom; pt's sister and son in law are planning on staying to assist with pt and mom       Prior Functioning/Environment Prior Level of Function : Independent/Modified Independent             Mobility Comments: no AD ADLs Comments: indep, drives        OT Problem List: Decreased activity tolerance         OT Goals(Current goals can be found in the care plan section) Acute Rehab OT Goals Patient Stated Goal: home OT Goal Formulation: With patient Time For Goal Achievement: 05/28/23 Potential to Achieve Goals: Good   AM-PAC OT "6 Clicks" Daily Activity     Outcome Measure Help from another person eating meals?: None Help from another person taking care of personal grooming?: None Help from another person toileting, which includes using toliet, bedpan, or urinal?: None Help from another person bathing (including washing, rinsing, drying)?: None Help from another person to put on and taking off regular upper body clothing?: None Help from another person to put on and taking off regular lower body clothing?: None 6 Click Score: 24   End of Session Equipment Utilized During Treatment: Cervical collar Nurse Communication: Mobility status  Activity Tolerance: Patient tolerated treatment well Patient left: in bed;with call bell/phone within reach  OT Visit Diagnosis: Pain                Time: 9629-5284 OT Time Calculation (min): 16 min Charges:  OT General Charges $OT Visit: 1 Visit OT Evaluation $OT Eval Low Complexity: 1 Low  Derenda Mis, OTR/L Acute Rehabilitation Services Office [phone] Secure Chat Communication Preferred   Donia Pounds 05/28/2023, 9:33 AM

## 2023-05-28 NOTE — Progress Notes (Signed)
Patient alert and oriented, void, ambulate. D/c instructions explain and given to the patient all questions answered to patient satisfaction. Patient d/c home per order.

## 2023-05-28 NOTE — Progress Notes (Signed)
Patient ID: Henry Nguyen, male   DOB: September 09, 1960, 62 y.o.   MRN: [id number]   Subjective: 1 Day Post-Op Procedure(s) (LRB): C5-6 ANTERIOR CERVICAL DISCECTOMY FUSION, ALLOGRAFT, PLATE (N/A) Patient reports pain as mild , arm pain gone.   Objective: Vital signs in last 24 hours: Temp:  [97.8 F (36.6 C)-98.8 F (37.1 C)] 98.2 F (36.8 C) (12/05 0740) Pulse Rate:  [59-77] 59 (12/05 0740) Resp:  [15-20] 18 (12/05 0740) BP: (104-157)/(52-95) 145/70 (12/05 0740) SpO2:  [93 %-98 %] 97 % (12/05 0740) Weight:  [102.1 kg] 102.1 kg (12/04 1033)  Intake/Output from previous day: 12/04 0701 - 12/05 0700 In: 950 [I.V.:600; IV Piggyback:350] Out: 60 [Drains:20; Blood:40] Intake/Output this shift: No intake/output data recorded.  No results for input(s): "HGB" in the last 72 hours. No results for input(s): "WBC", "RBC", "HCT", "PLT" in the last 72 hours. No results for input(s): "NA", "K", "CL", "CO2", "BUN", "CREATININE", "GLUCOSE", "CALCIUM" in the last 72 hours. No results for input(s): "LABPT", "INR" in the last 72 hours.  Neurologically intact DG Cervical Spine 2 or 3 views  Result Date: 05/27/2023 CLINICAL DATA:  Elective surgery. EXAM: CERVICAL SPINE - 2-3 VIEW COMPARISON:  None Available. FINDINGS: Three fluoroscopic spot views of the cervical spine obtained in the operating room. Anterior fusion hardware C5-C6. Fluoroscopy time 11 seconds. Dose 3.64 mGy. IMPRESSION: Intraoperative fluoroscopy during cervical fusion. Electronically Signed   By: Narda Rutherford M.D.   On: 05/27/2023 15:47   DG C-Arm 1-60 Min-No Report  Result Date: 05/27/2023 Fluoroscopy was utilized by the requesting physician.  No radiographic interpretation.   DG C-Arm 1-60 Min-No Report  Result Date: 05/27/2023 Fluoroscopy was utilized by the requesting physician.  No radiographic interpretation.   DG C-Arm 1-60 Min-No Report  Result Date: 05/27/2023 Fluoroscopy was utilized by the requesting physician.   No radiographic interpretation.    Assessment/Plan: 1 Day Post-Op Procedure(s) (LRB): C5-6 ANTERIOR CERVICAL DISCECTOMY FUSION, ALLOGRAFT, PLATE (N/A) Plan: drain pulled, new dressing , discharge.   Eldred Manges 05/28/2023, 8:17 AM

## 2023-05-28 NOTE — Discharge Instructions (Signed)
Restart Eliquis tonight when you get home. Leave dressing on until you see Dr. Ophelia Charter next week.  You will do better sleeping in a beachchair recliner position with less neck swelling.  Stay with softer food until swallowing returns to normal.  Keep her collar on at all times except when he switched to the extra collar for showering.  New medications has been sent into your pharmacy, [facility] in [place].

## 2023-06-01 ENCOUNTER — Other Ambulatory Visit: Payer: Self-pay | Admitting: Nurse Practitioner

## 2023-06-12 ENCOUNTER — Telehealth: Payer: Self-pay | Admitting: Orthopaedic Surgery

## 2023-06-12 NOTE — Telephone Encounter (Signed)
Patient called needing Rx refilled Oxycodone. The number to contact patient is [phone]

## 2023-06-12 NOTE — Progress Notes (Unsigned)
Referring Provider:Ziomek, Renae Fickle, MD  Primary Care Physician:  Shelby Dubin, FNP Primary Gastroenterologist:  Dr. Marletta Lor  No chief complaint on file.   HPI:   Henry Nguyen is a 62 y.o. male presenting today at the request of Chancy Milroy, MD to discuss scheduling surveillance colonoscopy.  He has medical history significant for heart failure with reduced ejection fraction (40-45%) and diastolic dysfunction, HTN, type 2 diabetes, CKD, paroxysmal atrial fibrillation on Eliquis.   Last colonoscopy 07/09/2018: - One 4 mm polyp at the hepatic flexure, removed with a cold snare. Complete resection. Polyp tissue not retrieved.  - MODERATE Diverticulosis in the entire examined colon.  - External and internal hemorrhoids.  - Redundant colon. -Recommended surveillance in 5-10 years.   Today:    Past Medical History:  Diagnosis Date   Arthritis    Diabetes mellitus without complication (HCC)    type 2 - Dexcom G7 System   GERD (gastroesophageal reflux disease)    Hypertension     Past Surgical History:  Procedure Laterality Date   ANTERIOR CERVICAL DECOMP/DISCECTOMY FUSION N/A 05/27/2023   Procedure: C5-6 ANTERIOR CERVICAL DISCECTOMY FUSION, ALLOGRAFT, PLATE;  Surgeon: Eldred Manges, MD;  Location: [facility] OR;  Service: Orthopedics;  Laterality: N/A;  Needs RNFA   COLONOSCOPY  2010   COLONOSCOPY N/A 07/09/2018   Procedure: COLONOSCOPY;  Surgeon: West Bali, MD;  Location: [facility]ITE;  Service: Endoscopy;  Laterality: N/A;  11:15   ESOPHAGOGASTRODUODENOSCOPY  2010   KNEE ARTHROSCOPY WITH MEDIAL MENISECTOMY Right 03/01/2021   Procedure: KNEE ARTHROSCOPY WITH MEDIAL MENISECTOMY;  Surgeon: Vickki Hearing, MD;  Location: [facility] ORS;  Service: Orthopedics;  Laterality: Right;   KNEE ARTHROSCOPY WITH MEDIAL MENISECTOMY Left 10/18/2021   Procedure: KNEE ARTHROSCOPY WITH PARTIAL MEDIAL MENISECTOMY TEAR;  Surgeon: Vickki Hearing, MD;  Location: [facility] ORS;  Service: Orthopedics;   Laterality: Left;   POLYPECTOMY  07/09/2018   Procedure: POLYPECTOMY;  Surgeon: West Bali, MD;  Location: [facility];  Service: Endoscopy;;   UPPER GI ENDOSCOPY  08/06/2004    Current Outpatient Medications  Medication Sig Dispense Refill   albuterol (VENTOLIN HFA) 108 (90 Base) MCG/ACT inhaler Inhale 1-2 puffs into the lungs every 6 (six) hours as needed for wheezing or shortness of breath.     apixaban (ELIQUIS) 5 MG TABS tablet Take 1 tablet (5 mg total) by mouth 2 (two) times daily. 60 tablet 5   Continuous Glucose Receiver (DEXCOM G7 RECEIVER) DEVI AS DIRECTED 1 each 0   Continuous Glucose Sensor (DEXCOM G7 SENSOR) MISC Inject 1 Application into the skin as directed. Change sensor every 10 days as directed. 9 each 3   furosemide (LASIX) 20 MG tablet Take 1 tablet (20 mg total) by mouth daily as needed (swelling/wt gain). 30 tablet 1   glipiZIDE (GLUCOTROL) 10 MG tablet Take 10 mg by mouth daily before breakfast.     insulin lispro (HUMALOG) 100 UNIT/ML injection Inject 5-11 Units into the skin 3 (three) times daily with meals.     losartan (COZAAR) 50 MG tablet Take 1 tablet (50 mg total) by mouth daily. 30 tablet 3   metFORMIN (GLUCOPHAGE) 500 MG tablet Take 1 tablet (500 mg total) by mouth 2 (two) times daily with a meal. 180 tablet 1   methocarbamol (ROBAXIN) 500 MG tablet Take 1 tablet (500 mg total) by mouth every 6 (six) hours as needed for muscle spasms. 40 tablet 0   metoprolol succinate (TOPROL-XL) 50 MG 24  hr tablet Take 1 tablet (50 mg total) by mouth daily. Take with or immediately following a meal. 90 tablet 3   omeprazole (PRILOSEC) 20 MG capsule Take 20 mg by mouth daily before breakfast.   5   oxyCODONE-acetaminophen (PERCOCET) 5-325 MG tablet Take 1-2 tablets by mouth every 6 (six) hours as needed for severe pain (pain score 7-10). 40 tablet 0   SEMGLEE, YFGN, 100 UNIT/ML Pen Inject 30 Units into the skin at bedtime.     No current facility-administered  medications for this visit.    Allergies as of 06/18/2023   (No Known Allergies)    Family History  Problem Relation Age of Onset   Heart attack Mother    CAD Mother    Diabetes Sister    Hypertension Sister    Colon cancer Neg Hx    Colon polyps Neg Hx     Social History   Socioeconomic History   Marital status: Divorced    Spouse name: Not on file   Number of children: 0   Years of education: Not on file   Highest education level: Not on file  Occupational History   Not on file  Tobacco Use   Smoking status: Never   Smokeless tobacco: Never  Vaping Use   Vaping status: Never Used  Substance and Sexual Activity   Alcohol use: Yes    Alcohol/week: 3.0 standard drinks of alcohol    Types: 3 Cans of beer per week    Comment: Beer   Drug use: Not Currently    Types: "Crack" 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   Sexual activity: Not Currently    Birth control/protection: None  Other Topics Concern   Not on file  Social History Narrative   Working a Production manager. Single. No kids.   Social Drivers of Corporate investment banker Strain: Low Risk  (01/17/2023)   Received from [facility]   Overall Financial Resource Strain (CARDIA)    Difficulty of Paying Living Expenses: Not hard at all  Food Insecurity: No Food Insecurity (01/17/2023)   Received from [facility]   Hunger Vital Sign    Worried About Running Out of Food in the Last Year: Never true    Ran Out of Food in the Last Year: Never true  Transportation Needs: No Transportation Needs (01/17/2023)   Received from [facility] - Transportation    Lack of Transportation (Medical): No    Lack of Transportation (Non-Medical): No  Physical Activity: Not on file  Stress: Not on file  Social Connections: Not on file  Intimate Partner Violence: Not At Risk (01/17/2023)   Received from [facility]   Humiliation, Afraid, Rape, and Kick questionnaire    Fear of  Current or Ex-Partner: No    Emotionally Abused: No    Physically Abused: No    Sexually Abused: No    Review of Systems: Gen: Denies any fever, chills, fatigue, weight loss, lack of appetite.  CV: Denies chest pain, heart palpitations, peripheral edema, syncope.  Resp: Denies shortness of breath at rest or with exertion. Denies wheezing or cough.  GI: Denies dysphagia or odynophagia. Denies jaundice, hematemesis, fecal incontinence. GU : Denies urinary burning, urinary frequency, urinary hesitancy MS: Denies joint pain, muscle weakness, cramps, or limitation of movement.  Derm: Denies rash, itching, dry skin Psych: Denies depression, anxiety, memory loss, and confusion Heme: Denies bruising,  bleeding, and enlarged lymph nodes.  Physical Exam: There were no vitals taken for this visit. General:   Alert and oriented. Pleasant and cooperative. Well-nourished and well-developed.  Head:  Normocephalic and atraumatic. Eyes:  Without icterus, sclera clear and conjunctiva pink.  Ears:  Normal auditory acuity. Lungs:  Clear to auscultation bilaterally. No wheezes, rales, or rhonchi. No distress.  Heart:  S1, S2 present without murmurs appreciated.  Abdomen:  +BS, soft, non-tender and non-distended. No HSM noted. No guarding or rebound. No masses appreciated.  Rectal:  Deferred  Msk:  Symmetrical without gross deformities. Normal posture. Extremities:  Without edema. Neurologic:  Alert and  oriented x4;  grossly normal neurologically. Skin:  Intact without significant lesions or rashes. Psych:  Alert and cooperative. Normal mood and affect.    Assessment:     Plan:  ***   Ermalinda Memos, PA-C [facility] 06/18/2023

## 2023-06-15 ENCOUNTER — Other Ambulatory Visit: Payer: Self-pay | Admitting: Orthopaedic Surgery

## 2023-06-15 ENCOUNTER — Telehealth: Payer: Self-pay | Admitting: Orthopaedic Surgery

## 2023-06-15 MED ORDER — TRAMADOL HCL 50 MG PO TABS: 50.0000 mg | ORAL_TABLET | Freq: Three times a day (TID) | ORAL | 0 refills | Status: AC | PRN

## 2023-06-15 NOTE — Telephone Encounter (Signed)
Patient called and need a refill on Oxycodone and a muscle relaxer. CB#[phone]

## 2023-06-15 NOTE — Telephone Encounter (Signed)
I left voicemail for patient requesting return call to schedule appointment.

## 2023-06-15 NOTE — Telephone Encounter (Signed)
I left voicemail for patient requesting return call to schedule appointment. Offered this Thursday in [place], this Friday in [place], or to work in to schedule next week. Asked for patient to return my call to schedule.   Please make patient appointment if he returns call. He is post op and needs to be seen.

## 2023-06-18 ENCOUNTER — Encounter: Payer: Medicaid Other | Admitting: Orthopaedic Surgery

## 2023-06-18 ENCOUNTER — Telehealth: Payer: Self-pay | Admitting: *Deleted

## 2023-06-18 ENCOUNTER — Encounter: Payer: Self-pay | Admitting: Gastroenterology

## 2023-06-18 ENCOUNTER — Ambulatory Visit: Payer: Medicaid Other | Admitting: Gastroenterology

## 2023-06-18 VITALS — BP 152/78 | HR 65 | Temp 98.1°F | Ht 68.5 in | Wt 214.2 lb

## 2023-06-18 DIAGNOSIS — Z860100 Personal history of colon polyps, unspecified: Secondary | ICD-10-CM | POA: Diagnosis not present

## 2023-06-18 DIAGNOSIS — Z09 Encounter for follow-up examination after completed treatment for conditions other than malignant neoplasm: Secondary | ICD-10-CM

## 2023-06-18 NOTE — Patient Instructions (Signed)
We will get you scheduled for colonoscopy in the near future with Dr. Marletta Lor. We will get the okay from your cardiologist to hold Eliquis for 2 days prior to your colonoscopy. 1 day prior to your colonoscopy: Take one half dose of Semglee at bedtime, take your mealtime insulin as needed.  You can take metformin and glipizide as prescribed. Day of your colonoscopy: Do not take any morning diabetes medications.  We will see back in the office as needed.  It was nice to see you today!  Ermalinda Memos, PA-C [facility]

## 2023-06-18 NOTE — Telephone Encounter (Signed)
  Request for patient to stop medication prior to procedure or is needing cleareance  06/18/23  Henry Nguyen [birth year]  What type of surgery is being performed? Colonoscopy  When is surgery scheduled? TBD  What type of clearance is required (medical or pharmacy to hold medication or both? medication  Are there any medications that need to be held prior to surgery and how long? Eliquis x 2 days  Name of physician performing surgery?  Dr.Carver [facility] at [employer]: [phone] Fax: [phone]  Anethesia type (none, local, MAC, general)? MAC

## 2023-06-18 NOTE — Telephone Encounter (Signed)
Patient with diagnosis of A Fib on Eliquis for anticoagulation.    Procedure: colonoscopy Date of procedure: TBD   CHA2DS2-VASc Score = 2  This indicates a 2.2% annual risk of stroke. The patient's score is based upon: CHF History: 0 HTN History: 1 Diabetes History: 1 Stroke History: 0 Vascular Disease History: 0 Age Score: 0 Gender Score: 0     CrCl 92 ml/min Platelet count 232K   Per office protocol, patient can hold Eliquis for 2 days prior to procedure.    **This guidance is not considered finalized until pre-operative APP has relayed final recommendations.**

## 2023-06-18 NOTE — Telephone Encounter (Signed)
   Patient Name: Henry Nguyen  DOB: 1961-01-30 MRN: [id number]  Primary Cardiologist: Donato Schultz, MD  Clinical pharmacists have reviewed the patient's past medical history, labs, and current medications as part of preoperative protocol coverage. The following recommendations have been made: Patient with diagnosis of A Fib on Eliquis for anticoagulation.     Procedure: colonoscopy Date of procedure: TBD   CHA2DS2-VASc Score = 2  This indicates a 2.2% annual risk of stroke. The patient's score is based upon: CHF History: 0 HTN History: 1 Diabetes History: 1 Stroke History: 0 Vascular Disease History: 0 Age Score: 0 Gender Score: 0    CrCl 92 ml/min Platelet count 232K   Per office protocol, patient can hold Eliquis for 2 days prior to procedure.  Please resume when safe to do so from a bleeding standpoint.    I will route this recommendation to the requesting party via Epic fax function and remove from pre-op pool.  Please call with questions.  Rip Harbour, NP 06/18/2023, 12:25 PM

## 2023-06-18 NOTE — Telephone Encounter (Signed)
Patient called back, appointment has been scheduled. °

## 2023-06-22 NOTE — Telephone Encounter (Signed)
Please advise. Thank you

## 2023-06-22 NOTE — Telephone Encounter (Signed)
Okay to hold Eliquis x 2 days prior to colonoscopy.  Please proceed with scheduling.

## 2023-06-25 ENCOUNTER — Other Ambulatory Visit (INDEPENDENT_AMBULATORY_CARE_PROVIDER_SITE_OTHER): Payer: Medicaid Other

## 2023-06-25 ENCOUNTER — Ambulatory Visit: Payer: Medicaid Other | Admitting: Orthopaedic Surgery

## 2023-06-25 VITALS — Ht 68.5 in | Wt 214.0 lb

## 2023-06-25 DIAGNOSIS — Z981 Arthrodesis status: Secondary | ICD-10-CM | POA: Diagnosis not present

## 2023-06-25 DIAGNOSIS — Z91199 Patient's noncompliance with other medical treatment and regimen due to unspecified reason: Secondary | ICD-10-CM

## 2023-06-25 NOTE — Progress Notes (Signed)
 Post-Op Visit Note   Patient: Henry Nguyen           Date of Birth: Dec 27, 1960           MRN: [id number] Visit Date: 06/25/2023 PCP: Bucio, Elsa C, FNP   Assessment & Plan: 63 year old male returns he is noncompliant did not keep any of his visits despite being called in this first time it has been seen since his surgery a month ago.  Does not have his collar did not have Steri-Strips on there is no dressing.  Fortunately the incisions healed.  X-rays show no backout of the screws and graft is in place.  He states his neck still hurting and needs not getting relief with Tylenol .  He agrees to go home post collar on return in 4 weeks.  He can use some ibuprofen  today for discomfort.  On return in 4 weeks lateral flexion-extension C-spine x-rays.  Chief Complaint:  Chief Complaint  Patient presents with   Neck - Routine Post Op    05/27/2023 C5-6 ACDF   Visit Diagnoses:  1. Status post cervical spinal fusion   2. Noncompliance with treatment     Plan: Collar coverage given .He agrees to wear his collar he requested more pain medication we discussed with him he needs to follow his postop instructions and he can use some ibuprofen .  Return in 4 weeks for a flexion-extension lateral C-spine x-ray out of collar.  Follow-Up Instructions: No follow-ups on file.   Orders:  Orders Placed This Encounter  Procedures   XR Cervical Spine 2 or 3 views   No orders of the defined types were placed in this encounter.   Imaging: No results found.  PMFS History: Patient Active Problem List   Diagnosis Date Noted   Noncompliance with treatment 06/25/2023   S/P cervical spinal fusion 05/27/2023   Hypercoagulable state due to paroxysmal atrial fibrillation (HCC) 11/25/2022   Diabetes mellitus with hyperglycemia (HCC) 10/23/2022   Chronic kidney disease, stage 2 (mild) 10/23/2022   Atrial fibrillation with RVR (HCC) 10/22/2022   Spinal stenosis of cervical region 09/25/2022   Other  spondylosis with radiculopathy, cervical region 09/25/2022   Neural foraminal stenosis of cervical spine 09/25/2022   S/P left knee arthroscopy 10/18/21  11/14/2021   Arthralgia of lower leg 03/13/2021   Long term (current) use of insulin  (HCC) 03/13/2021   Mixed hyperlipidemia 03/13/2021   Osteoarthritis of knee 03/13/2021   Type 2 diabetes mellitus without complications (HCC) 03/13/2021   Vitamin D deficiency 03/13/2021   Folliculitis 03/13/2021   S/P right knee arthroscopy 03/01/21 03/12/2021   Acute medial meniscus tear of left knee    Special screening for malignant neoplasms, colon    Diabetic hyperosmolar non-ketotic state (HCC) 12/01/2017   Hypertension 12/01/2017   GERD (gastroesophageal reflux disease) 12/01/2017   Hyponatremia 12/01/2017   AKI (acute kidney injury) (HCC) 12/01/2017   Rectal bleeding 12/01/2017   Past Medical History:  Diagnosis Date   Arthritis    Diabetes mellitus without complication (HCC)    type 2 - Dexcom G7 System   GERD (gastroesophageal reflux disease)    Heart failure (HCC)    Hypertension    PAF (paroxysmal atrial fibrillation) (HCC)     Family History  Problem Relation Age of Onset   Heart attack Mother    CAD Mother    Diabetes Sister    Hypertension Sister    Colon cancer Neg Hx    Colon polyps Neg Hx  Past Surgical History:  Procedure Laterality Date   ANTERIOR CERVICAL DECOMP/DISCECTOMY FUSION N/A 05/27/2023   Procedure: C5-6 ANTERIOR CERVICAL DISCECTOMY FUSION, ALLOGRAFT, PLATE;  Surgeon: Barbarann Oneil BROCKS, MD;  Location: [facility];  Service: Orthopedics;  Laterality: N/A;  Needs RNFA   COLONOSCOPY  2010   COLONOSCOPY N/A 07/09/2018   Procedure: COLONOSCOPY;  Surgeon: Harvey Margo CROME, MD;  Location: [facility];  Service: Endoscopy;  Laterality: N/A;  11:15   ESOPHAGOGASTRODUODENOSCOPY  2010   KNEE ARTHROSCOPY WITH MEDIAL MENISECTOMY Right 03/01/2021   Procedure: KNEE ARTHROSCOPY WITH MEDIAL MENISECTOMY;  Surgeon: Margrette Taft BRAVO, MD;  Location: [facility];  Service: Orthopedics;  Laterality: Right;   KNEE ARTHROSCOPY WITH MEDIAL MENISECTOMY Left 10/18/2021   Procedure: KNEE ARTHROSCOPY WITH PARTIAL MEDIAL MENISECTOMY TEAR;  Surgeon: Margrette Taft BRAVO, MD;  Location: [facility];  Service: Orthopedics;  Laterality: Left;   POLYPECTOMY  07/09/2018   Procedure: POLYPECTOMY;  Surgeon: Harvey Margo CROME, MD;  [facility];  Service: Endoscopy;;   UPPER GI ENDOSCOPY  08/06/2004   Social History   Occupational History   Not on file  Tobacco Use   Smoking status: Never   Smokeless tobacco: Never  Vaping Use   Vaping status: Never Used  Substance and Sexual Activity   Alcohol use: Yes    Alcohol/week: 3.0 standard drinks of alcohol    Types: 3 Cans of beer per week    Comment: Beer   Drug use: Not Currently    Types: 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   Sexual activity: Not Currently    Birth control/protection: None

## 2023-06-25 NOTE — Telephone Encounter (Signed)
 LMTRC

## 2023-06-26 ENCOUNTER — Encounter: Payer: Self-pay | Admitting: *Deleted

## 2023-06-26 ENCOUNTER — Other Ambulatory Visit: Payer: Self-pay | Admitting: *Deleted

## 2023-06-26 MED ORDER — PEG 3350-KCL-NA BICARB-NACL 420 G PO SOLR: 4000.0000 mL | Freq: Once | ORAL | 0 refills | Status: AC

## 2023-06-26 NOTE — Telephone Encounter (Signed)
 Pt has been scheduled for 07/20/23. Instructions mailed and prep sent to the pharmacy.

## 2023-06-30 ENCOUNTER — Encounter: Payer: Self-pay | Admitting: *Deleted

## 2023-07-14 NOTE — Patient Instructions (Signed)
Henry Nguyen  07/14/2023     @PREFPERIOPPHARMACY @   Your procedure is scheduled on  07/20/2023.    Report to [facility] at  1145  A.M.   Call this number if you have problems the morning of surgery:  [phone]  If you experience any cold or flu symptoms such as cough, fever, chills, shortness of breath, etc. between now and your scheduled surgery, please notify us at the above number.   Remember:        Your last dose of eliquis should be on 07/17/2023.        Tale 1/2 of your usual Semglee dosage the night before your procedure.        DO NOT take any medications for diabetes the morning of your procedure.        Use your inhaler before you come and bring your rescue inhaler with you.   Follow the diet and prep instructions given to you by the office.   You may drink clear liquids until 0945 am on 07/20/2023.     Clear liquids allowed are:                    Water, Juice (No red color; non-citric and without pulp; diabetics please choose diet or no sugar options), Carbonated beverages (diabetics please choose diet or no sugar options), Clear Tea (No creamer, milk, or cream, including half & half and powdered creamer), Black Coffee Only (No creamer, milk or cream, including half & half and powdered creamer), and Clear Sports drink (No red color; diabetics please choose diet or no sugar options)    Take these medicines the morning of surgery with A SIP OF WATER       methocarbamol (if needed), metoprolol, omeprazole, tramadol (if needed).    Do not wear jewelry, make-up or nail polish, including gel polish,  artificial nails, or any other type of covering on natural nails (fingers and  toes).  Do not wear lotions, powders, or perfumes, or deodorant.  Do not shave 48 hours prior to surgery.  Men may shave face and neck.  Do not bring valuables to the hospital.  [facility] is not responsible for any belongings or valuables.  Contacts, dentures or bridgework  may not be worn into surgery.  Leave your suitcase in the car.  After surgery it may be brought to your room.  For patients admitted to the hospital, discharge time will be determined by your treatment team.  Patients discharged the day of surgery will not be allowed to drive home and must have someone with them for 24 hours.    Special instructions:   DO NOT smoke tobacco or vape for 24 hours before your procedure.  Please read over the following fact sheets that you were given. Anesthesia Post-op Instructions and Care and Recovery After Surgery      Colonoscopy, Adult, Care After The following information offers guidance on how to care for yourself after your procedure. Your health care provider may also give you more specific instructions. If you have problems or questions, contact your health care provider. What can I expect after the procedure? After the procedure, it is common to have: A small amount of blood in your stool for 24 hours after the procedure. Some gas. Mild cramping or bloating of your abdomen. Follow these instructions at home: Eating and drinking  Drink enough fluid to keep your urine pale yellow. Follow instructions from your  health care provider about eating or drinking restrictions. Resume your normal diet as told by your health care provider. Avoid heavy or fried foods that are hard to digest. Activity Rest as told by your health care provider. Avoid sitting for a long time without moving. Get up to take short walks every 1-2 hours. This is important to improve blood flow and breathing. Ask for help if you feel weak or unsteady. Return to your normal activities as told by your health care provider. Ask your health care provider what activities are safe for you. Managing cramping and bloating  Try walking around when you have cramps or feel bloated. If directed, apply heat to your abdomen as told by your health care provider. Use the heat source that your  health care provider recommends, such as a moist heat pack or a heating pad. Place a towel between your skin and the heat source. Leave the heat on for 20-30 minutes. Remove the heat if your skin turns bright red. This is especially important if you are unable to feel pain, heat, or cold. You have a greater risk of getting burned. General instructions If you were given a sedative during the procedure, it can affect you for several hours. Do not drive or operate machinery until your health care provider says that it is safe. For the first 24 hours after the procedure: Do not sign important documents. Do not drink alcohol. Do your regular daily activities at a slower pace than normal. Eat soft foods that are easy to digest. Take over-the-counter and prescription medicines only as told by your health care provider. Keep all follow-up visits. This is important. Contact a health care provider if: You have blood in your stool 2-3 days after the procedure. Get help right away if: You have more than a small spotting of blood in your stool. You have large blood clots in your stool. You have swelling of your abdomen. You have nausea or vomiting. You have a fever. You have increasing pain in your abdomen that is not relieved with medicine. These symptoms may be an emergency. Get help right away. Call 911. Do not wait to see if the symptoms will go away. Do not drive yourself to the hospital. Summary After the procedure, it is common to have a small amount of blood in your stool. You may also have mild cramping and bloating of your abdomen. If you were given a sedative during the procedure, it can affect you for several hours. Do not drive or operate machinery until your health care provider says that it is safe. Get help right away if you have a lot of blood in your stool, nausea or vomiting, a fever, or increased pain in your abdomen. This information is not intended to replace advice given to you  by your health care provider. Make sure you discuss any questions you have with your health care provider. Document Revised: 07/22/2022 Document Reviewed: 01/30/2021 Elsevier Patient Education  2024 Else[employer].Monitored Anesthesia Care, Care After The following information offers guidance on how to care for yourself after your procedure. Your health care provider may also give you more specific instructions. If you have problems or questions, contact your health care provider. What can I expect after the procedure? After the procedure, it is common to have: Tiredness. Little or no memory about what happened during or after the procedure. Impaired judgment when it comes to making decisions. Nausea or vomiting. Some trouble with balance. Follow these instructions at home:  For the time period you were told by your health care provider:  Rest. Do not participate in activities where you could fall or become injured. Do not drive or use machinery. Do not drink alcohol. Do not take sleeping pills or medicines that cause drowsiness. Do not make important decisions or sign legal documents. Do not take care of children on your own. Medicines Take over-the-counter and prescription medicines only as told by your health care provider. If you were prescribed antibiotics, take them as told by your health care provider. Do not stop using the antibiotic even if you start to feel better. Eating and drinking Follow instructions from your health care provider about what you may eat and drink. Drink enough fluid to keep your urine pale yellow. If you vomit: Drink clear fluids slowly and in small amounts as you are able. Clear fluids include water, ice chips, low-calorie sports drinks, and fruit juice that has water added to it (diluted fruit juice). Eat light and bland foods in small amounts as you are able. These foods include bananas, applesauce, rice, lean meats, toast, and crackers. General  instructions  Have a responsible adult stay with you for the time you are told. It is important to have someone help care for you until you are awake and alert. If you have sleep apnea, surgery and some medicines can increase your risk for breathing problems. Follow instructions from your health care provider about wearing your sleep device: When you are sleeping. This includes during daytime naps. While taking prescription pain medicines, sleeping medicines, or medicines that make you drowsy. Do not use any products that contain nicotine or tobacco. These products include cigarettes, chewing tobacco, and vaping devices, such as e-cigarettes. If you need help quitting, ask your health care provider. Contact a health care provider if: You feel nauseous or vomit every time you eat or drink. You feel light-headed. You are still sleepy or having trouble with balance after 24 hours. You get a rash. You have a fever. You have redness or swelling around the IV site. Get help right away if: You have trouble breathing. You have new confusion after you get home. These symptoms may be an emergency. Get help right away. Call 911. Do not wait to see if the symptoms will go away. Do not drive yourself to the hospital. This information is not intended to replace advice given to you by your health care provider. Make sure you discuss any questions you have with your health care provider. Document Revised: 11/04/2021 Document Reviewed: 11/04/2021 Elsevier Patient Education  2024 [employer].

## 2023-07-15 ENCOUNTER — Encounter (HOSPITAL_COMMUNITY)
Admission: RE | Admit: 2023-07-15 | Discharge: 2023-07-15 | Disposition: A | Discharge status: Home / Self Care | Payer: Medicaid Other | Source: Ambulatory Visit | Attending: Internal Medicine | Admitting: Internal Medicine

## 2023-07-15 ENCOUNTER — Encounter (HOSPITAL_COMMUNITY): Payer: Self-pay

## 2023-07-15 VITALS — BP 135/76 | HR 82 | Temp 97.7°F | Resp 18 | Ht 68.5 in | Wt 214.0 lb

## 2023-07-15 DIAGNOSIS — E119 Type 2 diabetes mellitus without complications: Secondary | ICD-10-CM | POA: Diagnosis not present

## 2023-07-15 DIAGNOSIS — Z01812 Encounter for preprocedural laboratory examination: Secondary | ICD-10-CM | POA: Diagnosis present

## 2023-07-15 HISTORY — DX: Cardiac arrhythmia, unspecified: I49.9

## 2023-07-15 LAB — BASIC METABOLIC PANEL
Anion gap: 8 (ref 5–15)
BUN: 19 mg/dL (ref 8–23)
CO2: 28 mmol/L (ref 22–32)
Calcium: 8.7 mg/dL — ABNORMAL LOW (ref 8.9–10.3)
Chloride: 108 mmol/L (ref 98–111)
Creatinine, Ser: 0.97 mg/dL (ref 0.61–1.24)
GFR, Estimated: >60 mL/min (ref >60)
Glucose, Bld: 39 mg/dL — CL (ref 70–99)
Potassium: 4.2 mmol/L (ref 3.5–5.1)
Sodium: 144 mmol/L (ref 135–145)

## 2023-07-15 NOTE — Progress Notes (Signed)
CBG 39.  Attempted to contact patient but no answer.  Talked to his sister and instructed her to let him know about the low blood sugar. He will need to take action quickly. She verbalized understanding and will instruct patient to call us back at [phone].

## 2023-07-16 ENCOUNTER — Encounter (HOSPITAL_COMMUNITY): Payer: Self-pay | Admitting: Internal Medicine

## 2023-07-16 ENCOUNTER — Ambulatory Visit (HOSPITAL_COMMUNITY)
Admission: RE | Admit: 2023-07-16 | Discharge: 2023-07-16 | Disposition: A | Discharge status: Home / Self Care | Payer: Medicaid Other | Source: Ambulatory Visit | Attending: Internal Medicine | Admitting: Internal Medicine

## 2023-07-16 VITALS — BP 140/72 | HR 73 | Ht 68.5 in | Wt 227.2 lb

## 2023-07-16 DIAGNOSIS — N1831 Chronic kidney disease, stage 3a: Secondary | ICD-10-CM | POA: Diagnosis not present

## 2023-07-16 DIAGNOSIS — I480 Paroxysmal atrial fibrillation: Secondary | ICD-10-CM | POA: Insufficient documentation

## 2023-07-16 DIAGNOSIS — K219 Gastro-esophageal reflux disease without esophagitis: Secondary | ICD-10-CM | POA: Insufficient documentation

## 2023-07-16 DIAGNOSIS — D6869 Other thrombophilia: Secondary | ICD-10-CM | POA: Insufficient documentation

## 2023-07-16 DIAGNOSIS — Z7901 Long term (current) use of anticoagulants: Secondary | ICD-10-CM | POA: Insufficient documentation

## 2023-07-16 DIAGNOSIS — E1122 Type 2 diabetes mellitus with diabetic chronic kidney disease: Secondary | ICD-10-CM | POA: Diagnosis present

## 2023-07-16 DIAGNOSIS — Z7984 Long term (current) use of oral hypoglycemic drugs: Secondary | ICD-10-CM | POA: Diagnosis not present

## 2023-07-16 DIAGNOSIS — I129 Hypertensive chronic kidney disease with stage 1 through stage 4 chronic kidney disease, or unspecified chronic kidney disease: Secondary | ICD-10-CM | POA: Diagnosis present

## 2023-07-16 DIAGNOSIS — R9431 Abnormal electrocardiogram [ECG] [EKG]: Secondary | ICD-10-CM | POA: Diagnosis not present

## 2023-07-16 NOTE — Progress Notes (Signed)
Primary Care Physician: Shelby Dubin, FNP Primary Cardiologist: Dr. Anne Fu Primary Electrophysiologist: None Referring Physician: Dr. Donette Larry [name] is a 63 y.o. male with a history of HTN, T2DM, GERD, CKD stage 2-3a, and paroxysmal atrial fibrillation who presents for consultation in the [facility]. The patient was scheduled for cervical spine fusion and ECG on 4/26 for pre-admission testing showed Afib with RVR. He does not appear to have cardiac awareness of his Afib. He was admitted 5/1-2 and discharged on Eliquis 5 mg BID and diltiazem CD 120 mg daily. Echo showed EF 45% so diltiazem and lisinopril stopped. Patient started on Toprol 50 mg daily and losartan 25 mg daily. Patient is on Eliquis 5 mg BID for a CHADS2VASC score of 2.  On evaluation today, he is currently in NSR. He did not have cardiac awareness of his Afib. He does not have a smart phone but has a BP cuff at home. He feels well overall.  He is compliant with anticoagulation and has not missed any doses. He has no bleeding concerns. He has a few drinks of alcohol occasionally but denies excess alcohol intake. He does snore per nieces at home.   On follow up 01/08/23, he is currently in NSR. Cardiac monitor 11/2022 showed predominantly sinus rhythm. He feels well overall. No bleeding issues on Eliquis.   On follow up 07/16/23, he is currently in NSR. He has had no episodes of Afib since last office visit. No bleeding issues on Eliquis.   Today, he denies symptoms of palpitations, chest pain, shortness of breath, orthopnea, PND, lower extremity edema, dizziness, presyncope, syncope, snoring, daytime somnolence, bleeding, or neurologic sequela. The patient is tolerating medications without difficulties and is otherwise without complaint today.   Atrial Fibrillation Risk Factors:  he does have symptoms or diagnosis of sleep apnea. he does not have a history of rheumatic fever. he does have  a history of alcohol use. The patient does not have a history of early familial atrial fibrillation or other arrhythmias.  he has a BMI of Body mass index is 34.04 kg/m.Marland Kitchen Filed Weights   07/16/23 0915  Weight: 103.1 kg      Family History  Problem Relation Age of Onset   Heart attack Mother    CAD Mother    Diabetes Sister    Hypertension Sister    Colon cancer Neg Hx    Colon polyps Neg Hx     Atrial Fibrillation Management history:  Previous antiarrhythmic drugs: None Previous cardioversions: None Previous ablations: None Anticoagulation history: Eliquis 5 mg BID   Past Medical History:  Diagnosis Date   Arthritis    Diabetes mellitus without complication (HCC)    type 2 - Dexcom G7 System   Dysrhythmia    GERD (gastroesophageal reflux disease)    Heart failure (HCC)    Hypertension    PAF (paroxysmal atrial fibrillation) (HCC)    Past Surgical History:  Procedure Laterality Date   ANTERIOR CERVICAL DECOMP/DISCECTOMY FUSION N/A 05/27/2023   Procedure: C5-6 ANTERIOR CERVICAL DISCECTOMY FUSION, ALLOGRAFT, PLATE;  Surgeon: Henry Manges, MD;  Location: [facility] OR;  Service: Orthopedics;  Laterality: N/A;  Needs RNFA   COLONOSCOPY  2010   COLONOSCOPY N/A 07/09/2018   Procedure: COLONOSCOPY;  Surgeon: West Bali, MD;  [facility]: [facility];  Service: Endoscopy;  Laterality: N/A;  11:15   ESOPHAGOGASTRODUODENOSCOPY  2010   KNEE ARTHROSCOPY WITH MEDIAL MENISECTOMY Right 03/01/2021   Procedure: KNEE  ARTHROSCOPY WITH MEDIAL MENISECTOMY;  Surgeon: Vickki Hearing, MD;  Location: [facility];  Service: Orthopedics;  Laterality: Right;   KNEE ARTHROSCOPY WITH MEDIAL MENISECTOMY Left 10/18/2021   Procedure: KNEE ARTHROSCOPY WITH PARTIAL MEDIAL MENISECTOMY TEAR;  Surgeon: Vickki Hearing, MD;  Location: [facility];  Service: Orthopedics;  Laterality: Left;   POLYPECTOMY  07/09/2018   Procedure: POLYPECTOMY;  Surgeon: West Bali, MD;  [facility]: [facility];  Service:  Endoscopy;;   UPPER GI ENDOSCOPY  08/06/2004    Current Outpatient Medications  Medication Sig Dispense Refill   albuterol (VENTOLIN HFA) 108 (90 Base) MCG/ACT inhaler Inhale 1-2 puffs into the lungs every 6 (six) hours as needed for wheezing or shortness of breath.     apixaban (ELIQUIS) 5 MG TABS tablet Take 1 tablet (5 mg total) by mouth 2 (two) times daily. 60 tablet 5   Continuous Glucose Receiver (DEXCOM G7 RECEIVER) DEVI AS DIRECTED 1 each 0   Continuous Glucose Sensor (DEXCOM G7 SENSOR) MISC Inject 1 Application into the skin as directed. Change sensor every 10 days as directed. 9 each 3   furosemide (LASIX) 20 MG tablet Take 1 tablet (20 mg total) by mouth daily as needed (swelling/wt gain). 30 tablet 1   glipiZIDE (GLUCOTROL) 10 MG tablet Take 10 mg by mouth daily before breakfast.     insulin lispro (HUMALOG) 100 UNIT/ML injection Inject 5-11 Units into the skin 3 (three) times daily with meals.     losartan (COZAAR) 50 MG tablet Take 1 tablet (50 mg total) by mouth daily. 30 tablet 3   metFORMIN (GLUCOPHAGE) 500 MG tablet Take 1 tablet (500 mg total) by mouth 2 (two) times daily with a meal. 180 tablet 1   methocarbamol (ROBAXIN) 500 MG tablet Take 1 tablet (500 mg total) by mouth every 6 (six) hours as needed for muscle spasms. 40 tablet 0   metoprolol succinate (TOPROL-XL) 50 MG 24 hr tablet Take 1 tablet (50 mg total) by mouth daily. Take with or immediately following a meal. 90 tablet 3   omeprazole (PRILOSEC) 20 MG capsule Take 20 mg by mouth daily before breakfast.   5   SEMGLEE, YFGN, 100 UNIT/ML Pen Inject 30 Units into the skin at bedtime.     traMADol (ULTRAM) 50 MG tablet Take 1 tablet (50 mg total) by mouth every 8 (eight) hours as needed. 20 tablet 0   No current facility-administered medications for this encounter.    No Known Allergies  ROS- All systems are reviewed and negative except as per the HPI above.  Physical Exam: Vitals:   07/16/23 0915  BP: (!)  140/72  Pulse: 73  Weight: 103.1 kg  Height: 5' 8.5" (1.74 m)    GEN- The patient is well appearing, alert and oriented x 3 today.   Neck - no JVD or carotid bruit noted Lungs- Clear to ausculation bilaterally, normal work of breathing Heart- Regular rate and rhythm, no murmurs, rubs or gallops, PMI not laterally displaced Extremities- no clubbing, cyanosis, or edema Skin - no rash or ecchymosis noted   Wt Readings from Last 3 Encounters:  07/16/23 103.1 kg  07/15/23 97.1 kg  06/25/23 97.1 kg    EKG today demonstrates  Vent. rate 73 BPM PR interval 146 ms QRS duration 72 ms QT/QTcB 390/429 ms P-R-T axes 56 -28 -65 Normal sinus rhythm Anterior infarct , age undetermined ST & T wave abnormality, consider inferolateral ischemia Abnormal ECG When compared with ECG of 08-Jan-2023 08:55,  PREVIOUS ECG IS PRESENT  Echo 11/06/22 demonstrated: 1. Left ventricular ejection fraction, by estimation, is 40 to 45%. The  left ventricle has mildly decreased function. The left ventricle  demonstrates global hypokinesis. There is mild left ventricular  hypertrophy. Left ventricular diastolic parameters  are consistent with Grade I diastolic dysfunction (impaired relaxation).   2. Right ventricular systolic function is normal. The right ventricular  size is normal.   3. The mitral valve is normal in structure. Trivial mitral valve  regurgitation. No evidence of mitral stenosis.   4. The aortic valve is normal in structure. Aortic valve regurgitation is  not visualized.   Cardiac monitor 11/2022: Enrollment period 11 days 13 hours  Predominant rhythm was sinus rhythm Less than 1% ventricular and supraventricular ectopy Maximum heart rate 162, minimum heart rate 42, average heart rate 88 Patient triggered episodes associated with sinus rhythm, sinus tachycardia versus atrial tachycardia   Will Camnitz, MD  Epic records are reviewed at length today.  CHA2DS2-VASc Score = 2  The  patient's score is based upon: CHF History: 0 HTN History: 1 Diabetes History: 1 Stroke History: 0 Vascular Disease History: 0 Age Score: 0 Gender Score: 0       ASSESSMENT AND PLAN: Paroxysmal Atrial Fibrillation (ICD10:  I48.0) The patient's CHA2DS2-VASc score is 2, indicating a 2.2% annual risk of stroke.    He is currently in NSR. He has not had episodes of Afib in the past several months.   Continue conservative observation. Continue Toprol 50 mg daily.   2. Secondary Hypercoagulable State (ICD10:  D68.69) The patient is at significant risk for stroke/thromboembolism based upon his CHA2DS2-VASc Score of 2.  Continue Apixaban (Eliquis).  No missed doses.    3. HTN Recommended to check BP at home periodically.   Follow up 1 year Afib clinic.   Lake Bells, PA-C Afib Clinic [facility] [address] [place], [place] [address] [phone] 07/16/2023 9:36 AM

## 2023-07-20 ENCOUNTER — Ambulatory Visit (HOSPITAL_COMMUNITY)
Admission: RE | Admit: 2023-07-20 | Discharge: 2023-07-20 | Disposition: A | Discharge status: Home / Self Care | Payer: Medicaid Other | Attending: Internal Medicine | Admitting: Internal Medicine

## 2023-07-20 ENCOUNTER — Ambulatory Visit (HOSPITAL_BASED_OUTPATIENT_CLINIC_OR_DEPARTMENT_OTHER): Payer: Medicaid Other | Admitting: Anesthesiology

## 2023-07-20 ENCOUNTER — Other Ambulatory Visit: Payer: Self-pay

## 2023-07-20 ENCOUNTER — Ambulatory Visit (HOSPITAL_COMMUNITY): Payer: Medicaid Other | Admitting: Anesthesiology

## 2023-07-20 ENCOUNTER — Encounter (HOSPITAL_COMMUNITY): Payer: Self-pay | Admitting: Internal Medicine

## 2023-07-20 ENCOUNTER — Encounter (HOSPITAL_COMMUNITY)
Admission: RE | Disposition: A | Discharge status: Home / Self Care | Payer: Self-pay | Source: Home / Self Care | Attending: Internal Medicine

## 2023-07-20 DIAGNOSIS — K621 Rectal polyp: Secondary | ICD-10-CM

## 2023-07-20 DIAGNOSIS — I110 Hypertensive heart disease with heart failure: Secondary | ICD-10-CM | POA: Diagnosis not present

## 2023-07-20 DIAGNOSIS — K5730 Diverticulosis of large intestine without perforation or abscess without bleeding: Secondary | ICD-10-CM | POA: Diagnosis not present

## 2023-07-20 DIAGNOSIS — K648 Other hemorrhoids: Secondary | ICD-10-CM | POA: Diagnosis not present

## 2023-07-20 DIAGNOSIS — I509 Heart failure, unspecified: Secondary | ICD-10-CM | POA: Insufficient documentation

## 2023-07-20 DIAGNOSIS — K219 Gastro-esophageal reflux disease without esophagitis: Secondary | ICD-10-CM | POA: Insufficient documentation

## 2023-07-20 DIAGNOSIS — E119 Type 2 diabetes mellitus without complications: Secondary | ICD-10-CM | POA: Diagnosis not present

## 2023-07-20 DIAGNOSIS — I480 Paroxysmal atrial fibrillation: Secondary | ICD-10-CM | POA: Insufficient documentation

## 2023-07-20 DIAGNOSIS — Z1211 Encounter for screening for malignant neoplasm of colon: Secondary | ICD-10-CM | POA: Diagnosis present

## 2023-07-20 DIAGNOSIS — Z794 Long term (current) use of insulin: Secondary | ICD-10-CM | POA: Diagnosis not present

## 2023-07-20 DIAGNOSIS — Z7901 Long term (current) use of anticoagulants: Secondary | ICD-10-CM | POA: Diagnosis not present

## 2023-07-20 DIAGNOSIS — Z860100 Personal history of colon polyps, unspecified: Secondary | ICD-10-CM | POA: Diagnosis not present

## 2023-07-20 DIAGNOSIS — Z7984 Long term (current) use of oral hypoglycemic drugs: Secondary | ICD-10-CM | POA: Diagnosis not present

## 2023-07-20 HISTORY — PX: POLYPECTOMY: SHX5525

## 2023-07-20 HISTORY — PX: COLONOSCOPY WITH PROPOFOL: SHX5780

## 2023-07-20 LAB — GLUCOSE, CAPILLARY: Glucose-Capillary: 112 mg/dL — ABNORMAL HIGH (ref 70–99)

## 2023-07-20 SURGERY — COLONOSCOPY WITH PROPOFOL
Anesthesia: General

## 2023-07-20 MED ORDER — PROPOFOL 500 MG/50ML IV EMUL
INTRAVENOUS | Status: DC | PRN
  Administered 2023-07-20: 100 ug/kg/min via INTRAVENOUS

## 2023-07-20 MED ORDER — PROPOFOL 10 MG/ML IV BOLUS
INTRAVENOUS | Status: DC | PRN
  Administered 2023-07-20: 150 mg via INTRAVENOUS

## 2023-07-20 MED ORDER — LACTATED RINGERS IV SOLN: INTRAVENOUS | Status: DC

## 2023-07-20 NOTE — Anesthesia Procedure Notes (Signed)
Date/Time: 07/20/2023 2:34 PM  Performed by: Franco Nones, CRNAPre-anesthesia Checklist: Patient identified, Emergency Drugs available, Suction available, Timeout performed and Patient being monitored Patient Re-evaluated:Patient Re-evaluated prior to induction Oxygen Delivery Method: Nasal cannula Comments: Optiflow

## 2023-07-20 NOTE — Anesthesia Preprocedure Evaluation (Signed)
Anesthesia Evaluation  Patient identified by MRN, date of birth, ID band Patient awake    Reviewed: Allergy & Precautions, H&P , NPO status , Patient's Chart, lab work & pertinent test results, reviewed documented beta blocker date and time   Airway Mallampati: II  TM Distance: >3 FB Neck ROM: full    Dental no notable dental hx.    Pulmonary neg pulmonary ROS   Pulmonary exam normal breath sounds clear to auscultation       Cardiovascular Exercise Tolerance: Good hypertension, + dysrhythmias Atrial Fibrillation  Rhythm:regular Rate:Normal     Neuro/Psych  Neuromuscular disease  negative psych ROS   GI/Hepatic Neg liver ROS,GERD  ,,  Endo/Other  diabetes    Renal/GU Renal disease  negative genitourinary   Musculoskeletal   Abdominal   Peds  Hematology negative hematology ROS (+)   Anesthesia Other Findings   Reproductive/Obstetrics negative OB ROS                             Anesthesia Physical Anesthesia Plan  ASA: 3  Anesthesia Plan: General   Post-op Pain Management:    Induction:   PONV Risk Score and Plan: Propofol infusion  Airway Management Planned:   Additional Equipment:   Intra-op Plan:   Post-operative Plan:   Informed Consent: I have reviewed the patients History and Physical, chart, labs and discussed the procedure including the risks, benefits and alternatives for the proposed anesthesia with the patient or authorized representative who has indicated his/her understanding and acceptance.     Dental Advisory Given  Plan Discussed with: CRNA  Anesthesia Plan Comments:        Anesthesia Quick Evaluation

## 2023-07-20 NOTE — Op Note (Signed)
[facility] Patient Name: Henry Nguyen Procedure Date: 07/20/2023 2:28 PM MRN: [id number] Date of Birth: 1960/11/25 Attending MD: Hennie Duos. Marletta Lor , [place], [id number] CSN: [id number] Age: 63 Admit Type: Outpatient Procedure:                Colonoscopy Indications:              Surveillance: Personal history of adenomatous                            polyps on last colonoscopy 5 years ago Providers:                Hennie Duos. Marletta Lor, DO, Sheran Fava, Lennice Sites Technician, Technician Referring MD:              Medicines:                See the Anesthesia note for documentation of the                            administered medications Complications:            No immediate complications. Estimated Blood Loss:     Estimated blood loss was minimal. Procedure:                Pre-Anesthesia Assessment:                           - The anesthesia plan was to use monitored                            anesthesia care (MAC).                           After obtaining informed consent, the colonoscope                            was passed under direct vision. Throughout the                            procedure, the patient's blood pressure, pulse, and                            oxygen saturations were monitored continuously. The                            PCF-HQ190L ([id number]) scope was introduced through                            the anus and advanced to the the terminal ileum,                            with identification of the appendiceal orifice and                            IC valve. The colonoscopy  was performed without                            difficulty. The patient tolerated the procedure                            well. The quality of the bowel preparation was                            evaluated using the BBPS [facility] Bowel Preparation                            Scale) with scores of: Right Colon = 2 (minor                            amount  of residual staining, small fragments of                            stool and/or opaque liquid, but mucosa seen well),                            Transverse Colon = 2 (minor amount of residual                            staining, small fragments of stool and/or opaque                            liquid, but mucosa seen well) and Left Colon = 2                            (minor amount of residual staining, small fragments                            of stool and/or opaque liquid, but mucosa seen                            well). The total BBPS score equals 6. The quality                            of the bowel preparation was good. Scope In: 2:39:25 PM Scope Out: 2:59:22 PM Scope Withdrawal Time: 0 hours 16 minutes 54 seconds  Total Procedure Duration: 0 hours 19 minutes 57 seconds  Findings:      Non-bleeding internal hemorrhoids were found during retroflexion.      Many large-mouthed and small-mouthed diverticula were found in the       entire colon.      A 4 mm polyp was found in the rectum. The polyp was sessile. The polyp       was removed with a cold snare. Resection and retrieval were complete.      The terminal ileum appeared normal.      The exam was otherwise without abnormality. Impression:               - Non-bleeding internal hemorrhoids.                           -  Diverticulosis in the entire examined colon.                           - One 4 mm polyp in the rectum, removed with a cold                            snare. Resected and retrieved.                           - The examined portion of the ileum was normal.                           - The examination was otherwise normal. Moderate Sedation:      Per Anesthesia Care Recommendation:           - Patient has a contact number available for                            emergencies. The signs and symptoms of potential                            delayed complications were discussed with the                            patient.  Return to normal activities tomorrow.                            Written discharge instructions were provided to the                            patient.                           - Resume previous diet.                           - Continue present medications.                           - Await pathology results.                           - Repeat colonoscopy in 5 years for surveillance.                           - Return to GI clinic PRN. Procedure Code(s):        --- Professional ---                           [phone], Colonoscopy, flexible; with removal of                            tumor(s), polyp(s), or other lesion(s) by snare                            technique Diagnosis Code(s):        ---  Professional ---                           Z86.010, Personal history of colonic polyps                           K64.8, Other hemorrhoids                           D12.8, Benign neoplasm of rectum                           K57.30, Diverticulosis of large intestine without                            perforation or abscess without bleeding CPT copyright 2022 American Medical Association. All rights reserved. The codes documented in this report are preliminary and upon coder review may  be revised to meet current compliance requirements. Hennie Duos. Marletta Lor, DO Hennie Duos. Marletta Lor, DO 07/20/2023 3:02:51 PM This report has been signed electronically. Number of Addenda: 0

## 2023-07-20 NOTE — H&P (Signed)
Primary Care Physician:  Shelby Dubin, FNP Primary Gastroenterologist:  Dr. Marletta Lor  Pre-Procedure History & Physical: HPI:  Henry Nguyen is a 63 y.o. male is here for a colonoscopy to be performed for surveillance purposes, personal history of colon polyps   Past Medical History:  Diagnosis Date   Arthritis    Diabetes mellitus without complication (HCC)    type 2 - Dexcom G7 System   Dysrhythmia    GERD (gastroesophageal reflux disease)    Heart failure (HCC)    Hypertension    PAF (paroxysmal atrial fibrillation) (HCC)     Past Surgical History:  Procedure Laterality Date   ANTERIOR CERVICAL DECOMP/DISCECTOMY FUSION N/A 05/27/2023   Procedure: C5-6 ANTERIOR CERVICAL DISCECTOMY FUSION, ALLOGRAFT, PLATE;  Surgeon: Eldred Manges, MD;  Location: [facility] OR;  Service: Orthopedics;  Laterality: N/A;  Needs RNFA   COLONOSCOPY  2010   COLONOSCOPY N/A 07/09/2018   Procedure: COLONOSCOPY;  Surgeon: West Bali, MD;  Location: [facility] ENDO SUITE;  Service: Endoscopy;  Laterality: N/A;  11:15   ESOPHAGOGASTRODUODENOSCOPY  2010   KNEE ARTHROSCOPY WITH MEDIAL MENISECTOMY Right 03/01/2021   Procedure: KNEE ARTHROSCOPY WITH MEDIAL MENISECTOMY;  Surgeon: Vickki Hearing, MD;  Location: [facility] ORS;  Service: Orthopedics;  Laterality: Right;   KNEE ARTHROSCOPY WITH MEDIAL MENISECTOMY Left 10/18/2021   Procedure: KNEE ARTHROSCOPY WITH PARTIAL MEDIAL MENISECTOMY TEAR;  Surgeon: Vickki Hearing, MD;  Location: [facility] ORS;  Service: Orthopedics;  Laterality: Left;   POLYPECTOMY  07/09/2018   Procedure: POLYPECTOMY;  Surgeon: West Bali, MD;  Location: [facility] END[facility] SU[facility];  Service: Endoscopy;;   UPPER GI ENDOSCOPY  08/06/2004    Prior to Admission medications   Medication Sig Start Date End Date Taking? Authorizing Provider  albuterol (VENTOLIN HFA) 108 (90 Base) MCG/ACT inhaler Inhale 1-2 puffs into the lungs every 6 (six) hours as needed for wheezing or shortness of breath.   Yes [provider]  Continuous Glucose Receiver (DEXCOM G7 RECEIVER) DEVI AS DIRECTED 06/01/23  Yes Dani Gobble, NP  furosemide (LASIX) 20 MG tablet Take 1 tablet (20 mg total) by mouth daily as needed (swelling/wt gain). 02/11/23  Yes Eustace Pen, PA-C  glipiZIDE (GLUCOTROL) 10 MG tablet Take 10 mg by mouth daily before breakfast.   Yes [provider]  insulin lispro (HUMALOG) 100 UNIT/ML injection Inject 5-11 Units into the skin 3 (three) times daily with meals.   Yes [provider]  losartan (COZAAR) 50 MG tablet Take 1 tablet (50 mg total) by mouth daily. 11/25/22  Yes Eustace Pen, PA-C  metFORMIN (GLUCOPHAGE) 500 MG tablet Take 1 tablet (500 mg total) by mouth 2 (two) times daily with a meal. 04/16/23  Yes Reardon, Freddi Starr, NP  methocarbamol (ROBAXIN) 500 MG tablet Take 1 tablet (500 mg total) by mouth every 6 (six) hours as needed for muscle spasms. 05/28/23  Yes Eldred Manges, MD  metoprolol succinate (TOPROL-XL) 50 MG 24 hr tablet Take 1 tablet (50 mg total) by mouth daily. Take with or immediately following a meal. 11/11/22  Yes [name], MD  omeprazole (PRILOSEC) 20 MG capsule Take 20 mg by mouth daily before breakfast.  04/13/18  Yes [provider]  SEMGLEE, YFGN, 100 UNIT/ML Pen Inject 30 Units into the skin at bedtime. 04/20/23  Yes [provider]  traMADol (ULTRAM) 50 MG tablet Take 1 tablet (50 mg total) by mouth every 8 (eight) hours as needed. 06/15/23  Yes Yates,  Veverly Fells, MD  apixaban (ELIQUIS) 5 MG TABS tablet Take 1 tablet (5 mg total) by mouth 2 (two) times daily. 10/23/22   [name], MD  Continuous Glucose Sensor (DEXCOM G7 SENSOR) MISC Inject 1 Application into the skin as directed. Change sensor every 10 days as directed. 04/16/23   Dani Gobble, NP    Allergies as of 06/26/2023   (No Known Allergies)    Family History  Problem Relation Age of Onset   Heart attack Mother    CAD Mother    Diabetes Sister     Hypertension Sister    Colon cancer Neg Hx    Colon polyps Neg Hx     Social History   Socioeconomic History   Marital status: Divorced    Spouse name: Not on file   Number of children: 0   Years of education: Not on file   Highest education level: Not on file  Occupational History   Not on file  Tobacco Use   Smoking status: Never   Smokeless tobacco: Never   Tobacco comments:    Never smoked as of 07/16/23  Vaping Use   Vaping status: Never Used  Substance and Sexual Activity   Alcohol use: Not Currently    Alcohol/week: 3.0 standard drinks of alcohol    Types: 3 Cans of beer per week    Comment: not currently as of 07/16/23   Drug use: Not Currently    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   Sexual activity: Not Currently    Birth control/protection: None  Other Topics Concern   Not on file  Social History Narrative   Working a Production manager. Single. No kids.   Social Drivers of Corporate investment banker Strain: Low Risk  (01/17/2023)   Received from [facility]   Overall Financial Resource Strain (CARDIA)    Difficulty of Paying Living Expenses: Not hard at all  Food Insecurity: No Food Insecurity (01/17/2023)   Received from [facility]   Hunger Vital Sign    Worried About Running Out of Food in the Last Year: Never true    Ran Out of Food in the Last Year: Never true  Transportation Needs: No Transportation Needs (01/17/2023)   Received from [facility] - Transportation    Lack of Transportation (Medical): No    Lack of Transportation (Non-Medical): No  Physical Activity: Not on file  Stress: Not on file  Social Connections: Not on file  Intimate Partner Violence: Not At Risk (01/17/2023)   Received from [facility]   Humiliation, Afraid, Rape, and Kick questionnaire    Fear of Current or Ex-Partner: No    Emotionally Abused: No    Physically Abused: No    Sexually Abused: No    Review of Systems: See HPI,  otherwise negative ROS  Physical Exam: Vital signs in last 24 hours: Temp:  [98.6 F (37 C)] 98.6 F (37 C) (01/27 1232) Pulse Rate:  [62] 62 (01/27 1232) Resp:  [16] 16 (01/27 1232) BP: (121)/(78) 121/78 (01/27 1232) SpO2:  [100 %] 100 % (01/27 1232) Weight:  [103.1 kg] 103.1 kg (01/27 1232)   General:   Alert,  Well-developed, well-nourished, pleasant and cooperative in NAD Head:  Normocephalic and atraumatic. Eyes:  Sclera clear, no icterus.   Conjunctiva pink. Ears:  Normal auditory acuity. Nose:  No deformity, discharge,  or lesions. Msk:  Symmetrical without gross  deformities. Normal posture. Extremities:  Without clubbing or edema. Neurologic:  Alert and  oriented x4;  grossly normal neurologically. Skin:  Intact without significant lesions or rashes. Psych:  Alert and cooperative. Normal mood and affect.  Impression/Plan: Maddock Finigan is here for a colonoscopy to be performed for surveillance purposes, personal history of colon polyps  The risks of the procedure including infection, bleed, or perforation as well as benefits, limitations, alternatives and imponderables have been reviewed with the patient. Questions have been answered. All parties agreeable.

## 2023-07-20 NOTE — Transfer of Care (Signed)
Immediate Anesthesia Transfer of Care Note  Patient: Henry Nguyen  Procedure(s) Performed: COLONOSCOPY WITH PROPOFOL POLYPECTOMY  Patient Location: Short Stay  Anesthesia Type:General  Level of Consciousness: awake and patient cooperative  Airway & Oxygen Therapy: Patient Spontanous Breathing  Post-op Assessment: Report given to RN and Post -op Vital signs reviewed and stable  Post vital signs: Reviewed and stable  Last Vitals:  Vitals Value Taken Time  BP 102/68 07/20/23 1504  Temp 36.5 C 07/20/23 1504  Pulse 81 07/20/23 1504  Resp 17 07/20/23 1504  SpO2 100 % 07/20/23 1504    Last Pain:  Vitals:   07/20/23 1504  TempSrc: Oral  PainSc: 0-No pain         Complications: No notable events documented.

## 2023-07-20 NOTE — Discharge Instructions (Addendum)
Colonoscopy Discharge Instructions  Read the instructions outlined below and refer to this sheet in the next few weeks. These discharge instructions provide you with general information on caring for yourself after you leave the hospital. Your doctor may also give you specific instructions. While your treatment has been planned according to the most current medical practices available, unavoidable complications occasionally occur.   ACTIVITY You may resume your regular activity, but move at a slower pace for the next 24 hours.  Take frequent rest periods for the next 24 hours.  Walking will help get rid of the air and reduce the bloated feeling in your belly (abdomen).  No driving for 24 hours (because of the medicine (anesthesia) used during the test).   Do not sign any important legal documents or operate any machinery for 24 hours (because of the anesthesia used during the test).  NUTRITION Drink plenty of fluids.  You may resume your normal diet as instructed by your doctor.  Begin with a light meal and progress to your normal diet. Heavy or fried foods are harder to digest and may make you feel sick to your stomach (nauseated).  Avoid alcoholic beverages for 24 hours or as instructed.  MEDICATIONS You may resume your normal medications unless your doctor tells you otherwise.  WHAT YOU CAN EXPECT TODAY Some feelings of bloating in the abdomen.  Passage of more gas than usual.  Spotting of blood in your stool or on the toilet paper.  IF YOU HAD POLYPS REMOVED DURING THE COLONOSCOPY: No aspirin products for 7 days or as instructed.  No alcohol for 7 days or as instructed.  Eat a soft diet for the next 24 hours.  FINDING OUT THE RESULTS OF YOUR TEST Not all test results are available during your visit. If your test results are not back during the visit, make an appointment with your caregiver to find out the results. Do not assume everything is normal if you have not heard from your  caregiver or the medical facility. It is important for you to follow up on all of your test results.  SEEK IMMEDIATE MEDICAL ATTENTION IF: You have more than a spotting of blood in your stool.  Your belly is swollen (abdominal distention).  You are nauseated or vomiting.  You have a temperature over 101.  You have abdominal pain or discomfort that is severe or gets worse throughout the day.   Your colonoscopy revealed 1 polyp(s) which I removed successfully. Await pathology results, my office will contact you. I recommend repeating colonoscopy in 5 years for surveillance purposes.  You also have diverticulosis and internal hemorrhoids. I would recommend increasing fiber in your diet or adding OTC Benefiber/Metamucil. Be sure to drink at least 4 to 6 glasses of water daily. Follow-up with GI as needed.   I hope you have a great rest of your week!  Hennie Duos. Marletta Lor, D.O. Gastroenterology and Hepatology [facility]

## 2023-07-21 ENCOUNTER — Ambulatory Visit: Payer: Medicaid Other | Admitting: Nurse Practitioner

## 2023-07-21 ENCOUNTER — Encounter: Payer: Self-pay | Admitting: Nurse Practitioner

## 2023-07-21 VITALS — Ht 68.5 in | Wt 228.4 lb

## 2023-07-21 DIAGNOSIS — I10 Essential (primary) hypertension: Secondary | ICD-10-CM

## 2023-07-21 DIAGNOSIS — E782 Mixed hyperlipidemia: Secondary | ICD-10-CM | POA: Diagnosis not present

## 2023-07-21 DIAGNOSIS — E1165 Type 2 diabetes mellitus with hyperglycemia: Secondary | ICD-10-CM

## 2023-07-21 DIAGNOSIS — Z794 Long term (current) use of insulin: Secondary | ICD-10-CM | POA: Diagnosis not present

## 2023-07-21 DIAGNOSIS — Z7984 Long term (current) use of oral hypoglycemic drugs: Secondary | ICD-10-CM | POA: Diagnosis not present

## 2023-07-21 LAB — POCT GLYCOSYLATED HEMOGLOBIN (HGB A1C): Hemoglobin A1C: 6.4 % — AB (ref 4.0–5.6)

## 2023-07-21 MED ORDER — METFORMIN HCL 500 MG PO TABS: 500.0000 mg | ORAL_TABLET | Freq: Every day | ORAL | 3 refills | Status: AC

## 2023-07-21 MED ORDER — INSULIN LISPRO (1 UNIT DIAL) 100 UNIT/ML (KWIKPEN): 0.0000 [IU] | PEN_INJECTOR | Freq: Three times a day (TID) | SUBCUTANEOUS | 3 refills | Status: AC

## 2023-07-21 MED ORDER — SEMGLEE (YFGN) 100 UNIT/ML ~~LOC~~ SOPN: 20.0000 [IU] | PEN_INJECTOR | Freq: Every day | SUBCUTANEOUS | 3 refills | Status: AC

## 2023-07-21 NOTE — Progress Notes (Signed)
Endocrinology Follow Up Note       07/21/2023, 10:02 AM   Subjective:    Patient ID: Henry Nguyen, male    DOB: 1960-10-27.  Henry Nguyen is being seen in follow up after being seen in consultation for management of currently uncontrolled symptomatic diabetes requested by  Bucio, Julian Reil, FNP.   Past Medical History:  Diagnosis Date   Arthritis    Diabetes mellitus without complication (HCC)    type 2 - Dexcom G7 System   Dysrhythmia    GERD (gastroesophageal reflux disease)    Heart failure (HCC)    Hypertension    PAF (paroxysmal atrial fibrillation) (HCC)     Past Surgical History:  Procedure Laterality Date   ANTERIOR CERVICAL DECOMP/DISCECTOMY FUSION N/A 05/27/2023   Procedure: C5-6 ANTERIOR CERVICAL DISCECTOMY FUSION, ALLOGRAFT, PLATE;  Surgeon: Eldred Manges, MD;  Location: [facility];  Service: Orthopedics;  Laterality: N/A;  Needs RNFA   COLONOSCOPY  2010   COLONOSCOPY N/A 07/09/2018   Procedure: COLONOSCOPY;  Surgeon: West Bali, MD;  [facility];  Service: Endoscopy;  Laterality: N/A;  11:15   COLONOSCOPY WITH PROPOFOL N/A 07/20/2023   Procedure: COLONOSCOPY WITH PROPOFOL;  Surgeon: Lanelle Bal, DO;  [facility];  Service: Endoscopy;  Laterality: N/A;  1:30 pm, asa 3   ESOPHAGOGASTRODUODENOSCOPY  2010   KNEE ARTHROSCOPY WITH MEDIAL MENISECTOMY Right 03/01/2021   Procedure: KNEE ARTHROSCOPY WITH MEDIAL MENISECTOMY;  Surgeon: Vickki Hearing, MD;  Location: [facility];  Service: Orthopedics;  Laterality: Right;   KNEE ARTHROSCOPY WITH MEDIAL MENISECTOMY Left 10/18/2021   Procedure: KNEE ARTHROSCOPY WITH PARTIAL MEDIAL MENISECTOMY TEAR;  Surgeon: Vickki Hearing, MD;  Location: [facility];  Service: Orthopedics;  Laterality: Left;   POLYPECTOMY  07/09/2018   Procedure: POLYPECTOMY;  Surgeon: West Bali, MD;  [facility];  Service: Endoscopy;;    POLYPECTOMY  07/20/2023   Procedure: POLYPECTOMY;  Surgeon: Lanelle Bal, DO;  [facility];  Service: Endoscopy;;   UPPER GI ENDOSCOPY  08/06/2004    Social History   Socioeconomic History   Marital status: Divorced    Spouse name: Not on file   Number of children: 0   Years of education: Not on file   Highest education level: Not on file  Occupational History   Not on file  Tobacco Use   Smoking status: Never   Smokeless tobacco: Never   Tobacco comments:    Never smoked as of 07/16/23  Vaping Use   Vaping status: Never Used  Substance and Sexual Activity   Alcohol use: Not Currently    Alcohol/week: 3.0 standard drinks of alcohol    Types: 3 Cans of beer per week    Comment: not currently as of 07/16/23   Drug use: Not Currently    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   Sexual activity: Not Currently    Birth control/protection: None  Other Topics Concern   Not on file  Social History Narrative   Working a Production manager. Single. No kids.   Social Drivers of Corporate investment banker Strain: Low Risk  (  01/17/2023)   Received from [facility]   Overall Financial Resource Strain (CARDIA)    Difficulty of Paying Living Expenses: Not hard at all  Food Insecurity: No Food Insecurity (01/17/2023)   Received from [facility]   Hunger Vital Sign    Worried About Running Out of Food in the Last Year: Never true    Ran Out of Food in the Last Year: Never true  Transportation Needs: No Transportation Needs (01/17/2023)   Received from [facility] - Transportation    Lack of Transportation (Medical): No    Lack of Transportation (Non-Medical): No  Physical Activity: Not on file  Stress: Not on file  Social Connections: Not on file    Family History  Problem Relation Age of Onset   Heart attack Mother    CAD Mother    Diabetes Sister    Hypertension Sister    Colon cancer Neg Hx    Colon polyps Neg Hx      Outpatient Encounter Medications as of 07/21/2023  Medication Sig   albuterol (VENTOLIN HFA) 108 (90 Base) MCG/ACT inhaler Inhale 1-2 puffs into the lungs every 6 (six) hours as needed for wheezing or shortness of breath.   apixaban (ELIQUIS) 5 MG TABS tablet Take 1 tablet (5 mg total) by mouth 2 (two) times daily.   Continuous Glucose Receiver (DEXCOM G7 RECEIVER) DEVI AS DIRECTED   Continuous Glucose Sensor (DEXCOM G7 SENSOR) MISC Inject 1 Application into the skin as directed. Change sensor every 10 days as directed.   furosemide (LASIX) 20 MG tablet Take 1 tablet (20 mg total) by mouth daily as needed (swelling/wt gain).   insulin lispro (HUMALOG KWIKPEN) 100 UNIT/ML KwikPen Inject 0-6 Units into the skin 3 (three) times daily.   losartan (COZAAR) 50 MG tablet Take 1 tablet (50 mg total) by mouth daily.   methocarbamol (ROBAXIN) 500 MG tablet Take 1 tablet (500 mg total) by mouth every 6 (six) hours as needed for muscle spasms.   metoprolol succinate (TOPROL-XL) 50 MG 24 hr tablet Take 1 tablet (50 mg total) by mouth daily. Take with or immediately following a meal.   omeprazole (PRILOSEC) 20 MG capsule Take 20 mg by mouth daily before breakfast.    traMADol (ULTRAM) 50 MG tablet Take 1 tablet (50 mg total) by mouth every 8 (eight) hours as needed.   [DISCONTINUED] glipiZIDE (GLUCOTROL) 10 MG tablet Take 10 mg by mouth daily before breakfast.   [DISCONTINUED] insulin lispro (HUMALOG) 100 UNIT/ML injection Inject 0-6 Units into the skin 3 (three) times daily with meals.   [DISCONTINUED] metFORMIN (GLUCOPHAGE) 500 MG tablet Take 1 tablet (500 mg total) by mouth 2 (two) times daily with a meal.   [DISCONTINUED] SEMGLEE, YFGN, 100 UNIT/ML Pen Inject 20 Units into the skin at bedtime.   metFORMIN (GLUCOPHAGE) 500 MG tablet Take 1 tablet (500 mg total) by mouth daily with breakfast.   SEMGLEE, YFGN, 100 UNIT/ML Pen Inject 20 Units into the skin at bedtime.   No facility-administered encounter  medications on file as of 07/21/2023.    ALLERGIES: No Known Allergies  VACCINATION STATUS: Immunization History  Administered Date(s) Administered   Influenza,inj,Quad PF,6+ Mos 03/22/2018, 03/30/2019, 03/27/2020   Influenza,trivalent, recombinat, inj, PF 05/29/2017    Diabetes He presents for his follow-up diabetic visit. He has type 2 diabetes mellitus. Onset time: diagnosed at approx age of 68. His disease course has been improving. Hypoglycemia symptoms include nervousness/anxiousness, sweats and tremors.  Associated symptoms include blurred vision, fatigue, foot paresthesias, polydipsia and polyuria. Hypoglycemia complications include blackouts, hospitalization and required assistance. Symptoms are stable. Diabetic complications include nephropathy. Risk factors for coronary artery disease include diabetes mellitus, dyslipidemia, family history, obesity, male sex and hypertension. Current diabetic treatment includes intensive insulin program and oral agent (dual therapy). He is compliant with treatment most of the time. His weight is stable. He is following a generally healthy diet. When asked about meal planning, he reported none. He has not had a previous visit with a dietitian. He participates in exercise intermittently. His home blood glucose trend is decreasing rapidly. His overall blood glucose range is 140-180 mg/dl. (He presents today with his CGM showing improved glycemic profile with frequent hypoglycemia.  His POCT A1c today is 6.4%, improving from last visit of 12.8%.  He has drastically changed his diet.  He did have EMS called on him between visits for severe hypoglycemia.  He notes he had a mix up with his insulin was taking the prescribed amount from his PCP not from my recommendations last visit.  Analysis of his CGM shows TIR 45%, TAR 31%, TBR 24%, with a GMI of 6.8%.) An ACE inhibitor/angiotensin II receptor blocker is being taken. He does not see a podiatrist.Eye exam is  current.     Review of systems  Constitutional: + Minimally fluctuating body weight,  current Body mass index is 34.22 kg/m. , no fatigue, no subjective hyperthermia, no subjective hypothermia Eyes: no blurry vision, no xerophthalmia ENT: no sore throat, no nodules palpated in throat, no dysphagia/odynophagia, no hoarseness Cardiovascular: no chest pain, no shortness of breath, no palpitations, no leg swelling Respiratory: no cough, no shortness of breath Gastrointestinal: no nausea/vomiting/diarrhea Musculoskeletal: no muscle/joint aches Skin: no rashes, no hyperemia Neurological: no tremors, no numbness, no tingling, no dizziness Psychiatric: no depression, no anxiety  Objective:     Ht 5' 8.5" (1.74 m)   Wt 228 lb 6.4 oz (103.6 kg)   BMI 34.22 kg/m   Wt Readings from Last 3 Encounters:  07/21/23 228 lb 6.4 oz (103.6 kg)  07/20/23 227 lb 3.2 oz (103.1 kg)  07/16/23 227 lb 3.2 oz (103.1 kg)     BP Readings from Last 3 Encounters:  07/20/23 102/68  07/16/23 (!) 140/72  07/15/23 135/76      Physical Exam- Limited  Constitutional:  Body mass index is 34.22 kg/m. , not in acute distress, normal state of mind Eyes:  EOMI, no exophthalmos Musculoskeletal: no gross deformities, strength intact in all four extremities, no gross restriction of joint movements Skin:  no rashes, no hyperemia Neurological: no tremor with outstretched hands   Diabetic Foot Exam - Simple   No data filed     CMP ( most recent) CMP     Component Value Date/Time   NA 144 07/15/2023 1021   K 4.2 07/15/2023 1021   CL 108 07/15/2023 1021   CO2 28 07/15/2023 1021   GLUCOSE 39 (LL) 07/15/2023 1021   BUN 19 07/15/2023 1021   CREATININE 0.97 07/15/2023 1021   CALCIUM 8.7 (L) 07/15/2023 1021   PROT 5.6 (L) 10/23/2022 0055   ALBUMIN 2.9 (L) 10/23/2022 0055   AST 14 (L) 10/23/2022 0055   ALT 14 10/23/2022 0055   ALKPHOS 74 10/23/2022 0055   BILITOT 0.6 10/23/2022 0055   GFRNONAA >60  07/15/2023 1021     Diabetic Labs (most recent): Lab Results  Component Value Date   HGBA1C 6.4 (A) 07/21/2023   HGBA1C  7.5 (H) 05/20/2023   HGBA1C 12.8 02/13/2023     Lipid Panel ( most recent) Lipid Panel  No results found for: "CHOL", "TRIG", "HDL", "CHOLHDL", "VLDL", "LDLCALC", "LDLDIRECT", "LABVLDL"    Lab Results  Component Value Date   TSH 1.250 10/22/2022   TSH 0.480 12/02/2017           Assessment & Plan:   1) Type 2 diabetes mellitus with hyperglycemia, with long-term current use of insulin (HCC)  He presents today with his CGM showing improved glycemic profile with frequent hypoglycemia.  His POCT A1c today is 6.4%, improving from last visit of 12.8%.  He has drastically changed his diet.  He did have EMS called on him between visits for severe hypoglycemia.  He notes he had a mix up with his insulin was taking the prescribed amount from his PCP not from my recommendations last visit.  Analysis of his CGM shows TIR 45%, TAR 31%, TBR 24%, with a GMI of 6.8%.  - Henry Nguyen has currently uncontrolled symptomatic type 2 DM since 63 years of age.   -Recent labs reviewed.  - I had a long discussion with him about the progressive nature of diabetes and the pathology behind its complications. -his diabetes is complicated by CKD stage 3a and he remains at a high risk for more acute and chronic complications which include CAD, CVA, CKD, retinopathy, and neuropathy. These are all discussed in detail with him.  The following Lifestyle Medicine recommendations according to American College of Lifestyle Medicine [facility]) were discussed and offered to patient and he agrees to start the journey:  A. Whole Foods, Plant-based plate comprising of fruits and vegetables, plant-based proteins, whole-grain carbohydrates was discussed in detail with the patient.   A list for source of those nutrients were also provided to the patient.  Patient will use only water or unsweetened tea  for hydration. B.  The need to stay away from risky substances including alcohol, smoking; obtaining 7 to 9 hours of restorative sleep, at least 150 minutes of moderate intensity exercise weekly, the importance of healthy social connections,  and stress reduction techniques were discussed. C.  A full color page of Calorie density of various food groups per pound showing examples of each food groups was provided to the patient.  - Nutritional counseling repeated at each appointment due to patients tendency to fall back in to old habits.  - The patient admits there is a room for improvement in their diet and drink choices. -  Suggestion is made for the patient to avoid simple carbohydrates from their diet including Cakes, Sweet Desserts / Pastries, Ice Cream, Soda (diet and regular), Sweet Tea, Candies, Chips, Cookies, Sweet Pastries, Store Bought Juices, Alcohol in Excess of 1-2 drinks a day, Artificial Sweeteners, Coffee Creamer, and "Sugar-free" Products. This will help patient to have stable blood glucose profile and potentially avoid unintended weight gain.   - I encouraged the patient to switch to unprocessed or minimally processed complex starch and increased protein intake (animal or plant source), fruits, and vegetables.   - Patient is advised to stick to a routine mealtimes to eat 3 meals a day and avoid unnecessary snacks (to snack only to correct hypoglycemia).  - I have approached him with the following individualized plan to manage his diabetes and patient agrees:   -He is advised to continue his lower his Semglee to 20 units SQ nightly and lower his Humalot to 0-6 units TID with meals if glucose is above  90 and he is eating (Specific instructions on how to titrate insulin dosage based on glucose readings given to patient in writing).  I advised him to STOP the Glipizide altogether today.  He can continue his Metformin 500 mg po daily (could not tolerate BID due to GI upset).  -he is  encouraged to start monitoring glucose 4 times daily (using his CGM), before meals and before bed, and to call the clinic if he has glucose levels below 70 or above 300 for 3 tests in a row.  - he is warned not to take insulin without proper monitoring per orders. - Adjustment parameters are given to him for hypo and hyperglycemia in writing.  - Specific targets for  A1c; LDL, HDL, and Triglycerides were discussed with the patient.  2) Blood Pressure /Hypertension:  his blood pressure is controlled to target.   he is advised to continue his current medications including Losartan 50 mg p.o. daily with breakfast.  3) Lipids/Hyperlipidemia:    There is no recent lipid panel available to review nor is he on any lipid lowering medications.  Will check lipid panel on subsequent visits.  4)  Weight/Diet:  his Body mass index is 34.22 kg/m.  -  clearly complicating his diabetes care.   he is a candidate for weight loss. I discussed with him the fact that loss of 5 - 10% of his  current body weight will have the most impact on his diabetes management.  Exercise, and detailed carbohydrates information provided  -  detailed on discharge instructions.  5) Chronic Care/Health Maintenance: -he is on ACEI/ARB and not on Statin medications and is encouraged to initiate and continue to follow up with Ophthalmology, Dentist, Podiatrist at least yearly or according to recommendations, and advised to stay away from smoking. I have recommended yearly flu vaccine and pneumonia vaccine at least every 5 years; moderate intensity exercise for up to 150 minutes weekly; and sleep for at least 7 hours a day.  - he is advised to maintain close follow up with Bucio, Julian Reil, FNP for primary care needs, as well as his other providers for optimal and coordinated care.     I spent  43  minutes in the care of the patient today including review of labs from CMP, Lipids, Thyroid Function, Hematology (current and previous  including abstractions from other facilities); face-to-face time discussing  his blood glucose readings/logs, discussing hypoglycemia and hyperglycemia episodes and symptoms, medications doses, his options of short and long term treatment based on the latest standards of care / guidelines;  discussion about incorporating lifestyle medicine;  and documenting the encounter. Risk reduction counseling performed per USPSTF guidelines to reduce obesity and cardiovascular risk factors.     Please refer to Patient Instructions for Blood Glucose Monitoring and Insulin/Medications Dosing Guide"  in media tab for additional information. Please  also refer to " Patient Self Inventory" in the Media  tab for reviewed elements of pertinent patient history.  [employer] participated in the discussions, expressed understanding, and voiced agreement with the above plans.  All questions were answered to his satisfaction. he is encouraged to contact clinic should he have any questions or concerns prior to his return visit.     Follow up plan: - Return in about 4 months (around 11/18/2023) for Diabetes F/U with A1c in office, No previsit labs, Bring meter and logs.    Ronny Bacon, [facility] [address] [place], [place] [address] Phone: [phone] Fax:  [phone]  07/21/2023, 10:02 AM

## 2023-07-22 LAB — SURGICAL PATHOLOGY

## 2023-07-23 ENCOUNTER — Other Ambulatory Visit (INDEPENDENT_AMBULATORY_CARE_PROVIDER_SITE_OTHER): Payer: Medicaid Other

## 2023-07-23 ENCOUNTER — Encounter: Payer: Self-pay | Admitting: Orthopaedic Surgery

## 2023-07-23 ENCOUNTER — Ambulatory Visit: Payer: Medicaid Other | Admitting: Orthopaedic Surgery

## 2023-07-23 VITALS — Ht 68.5 in | Wt 228.0 lb

## 2023-07-23 DIAGNOSIS — Z981 Arthrodesis status: Secondary | ICD-10-CM

## 2023-07-23 NOTE — Progress Notes (Signed)
Post-Op Visit Note   Patient: Henry Nguyen           Date of Birth: 10-27-1960           MRN: [id number] Visit Date: 07/23/2023 PCP: Shelby Dubin, FNP   Assessment & Plan: 6 weeks post single level C5-6 ACDF.  X-rays show no motion good position and graft plate and screws.  Discontinue collar.  Arm pain and no radicular symptoms .  Chief Complaint:  Chief Complaint  Patient presents with   Neck - Routine Post Op, Follow-up    05/27/2023 C5-6 ACDF   Visit Diagnoses:  1. Status post cervical spinal fusion     Plan: Reviewed x-rays discontinue collar having surgical results follow-up as needed.  Follow-Up Instructions: No follow-ups on file.   Orders:  Orders Placed This Encounter  Procedures   XR Cervical Spine 2 or 3 views   No orders of the defined types were placed in this encounter.   Imaging: No results found.  PMFS History: Patient Active Problem List   Diagnosis Date Noted   Paroxysmal atrial fibrillation (HCC) 07/16/2023   Noncompliance with treatment 06/25/2023   S/P cervical spinal fusion 05/27/2023   Hypercoagulable state due to paroxysmal atrial fibrillation (HCC) 11/25/2022   Diabetes mellitus with hyperglycemia (HCC) 10/23/2022   Chronic kidney disease, stage 2 (mild) 10/23/2022   Atrial fibrillation with RVR (HCC) 10/22/2022   Spinal stenosis of cervical region 09/25/2022   Other spondylosis with radiculopathy, cervical region 09/25/2022   Neural foraminal stenosis of cervical spine 09/25/2022   S/P left knee arthroscopy 10/18/21  11/14/2021   Arthralgia of lower leg 03/13/2021   Long term (current) use of insulin (HCC) 03/13/2021   Mixed hyperlipidemia 03/13/2021   Osteoarthritis of knee 03/13/2021   Type 2 diabetes mellitus without complications (HCC) 03/13/2021   Vitamin D deficiency 03/13/2021   Folliculitis 03/13/2021   S/P right knee arthroscopy 03/01/21 03/12/2021   Acute medial meniscus tear of left knee    Special screening for  malignant neoplasms, colon    Diabetic hyperosmolar non-ketotic state (HCC) 12/01/2017   Hypertension 12/01/2017   GERD (gastroesophageal reflux disease) 12/01/2017   Hyponatremia 12/01/2017   AKI (acute kidney injury) (HCC) 12/01/2017   Rectal bleeding 12/01/2017   Past Medical History:  Diagnosis Date   Arthritis    Diabetes mellitus without complication (HCC)    type 2 - Dexcom G7 System   Dysrhythmia    GERD (gastroesophageal reflux disease)    Heart failure (HCC)    Hypertension    PAF (paroxysmal atrial fibrillation) (HCC)     Family History  Problem Relation Age of Onset   Heart attack Mother    CAD Mother    Diabetes Sister    Hypertension Sister    Colon cancer Neg Hx    Colon polyps Neg Hx     Past Surgical History:  Procedure Laterality Date   ANTERIOR CERVICAL DECOMP/DISCECTOMY FUSION N/A 05/27/2023   Procedure: C5-6 ANTERIOR CERVICAL DISCECTOMY FUSION, ALLOGRAFT, PLATE;  Surgeon: Eldred Manges, MD;  Location: [facility];  Service: Orthopedics;  Laterality: N/A;  Needs RNFA   COLONOSCOPY  2010   COLONOSCOPY N/A 07/09/2018   Procedure: COLONOSCOPY;  Surgeon: West Bali, MD;  [facility]: [facility];  Service: Endoscopy;  Laterality: N/A;  11:15   COLONOSCOPY WITH PROPOFOL N/A 07/20/2023   Procedure: COLONOSCOPY WITH PROPOFOL;  Surgeon: Lanelle Bal, DO;  [facility];  Service: Endoscopy;  Laterality:  N/A;  1:30 pm, asa 3   ESOPHAGOGASTRODUODENOSCOPY  2010   KNEE ARTHROSCOPY WITH MEDIAL MENISECTOMY Right 03/01/2021   Procedure: KNEE ARTHROSCOPY WITH MEDIAL MENISECTOMY;  Surgeon: Vickki Hearing, MD;  Location: [facility];  Service: Orthopedics;  Laterality: Right;   KNEE ARTHROSCOPY WITH MEDIAL MENISECTOMY Left 10/18/2021   Procedure: KNEE ARTHROSCOPY WITH PARTIAL MEDIAL MENISECTOMY TEAR;  Surgeon: Vickki Hearing, MD;  Location: [facility];  Service: Orthopedics;  Laterality: Left;   POLYPECTOMY  07/09/2018   Procedure: POLYPECTOMY;  Surgeon:  West Bali, MD;  [facility];  Service: Endoscopy;;   POLYPECTOMY  07/20/2023   Procedure: POLYPECTOMY;  Surgeon: Lanelle Bal, DO;  [facility];  Service: Endoscopy;;   UPPER GI ENDOSCOPY  08/06/2004   Social History   Occupational History   Not on file  Tobacco Use   Smoking status: Never   Smokeless tobacco: Never   Tobacco comments:    Never smoked as of 07/16/23  Vaping Use   Vaping status: Never Used  Substance and Sexual Activity   Alcohol use: Not Currently    Alcohol/week: 3.0 standard drinks of alcohol    Types: 3 Cans of beer per week    Comment: not currently as of 07/16/23   Drug use: Not Currently    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   Sexual activity: Not Currently    Birth control/protection: None

## 2023-07-25 NOTE — Anesthesia Postprocedure Evaluation (Signed)
Anesthesia Post Note  Patient: Henry Nguyen  Procedure(s) Performed: COLONOSCOPY WITH PROPOFOL POLYPECTOMY  Patient location during evaluation: Phase II Anesthesia Type: General Level of consciousness: awake Pain management: pain level controlled Vital Signs Assessment: post-procedure vital signs reviewed and stable Respiratory status: spontaneous breathing and respiratory function stable Cardiovascular status: blood pressure returned to baseline and stable Postop Assessment: no headache and no apparent nausea or vomiting Anesthetic complications: no Comments: Late entry   No notable events documented.   Last Vitals:  Vitals:   07/20/23 1232 07/20/23 1504  BP: 121/78 102/68  Pulse: 62 81  Resp: 16 17  Temp: 37 C 36.5 C  SpO2: 100% 100%    Last Pain:  Vitals:   07/20/23 1504  TempSrc: Oral  PainSc: 0-No pain                 Windell Norfolk

## 2023-09-30 ENCOUNTER — Other Ambulatory Visit (HOSPITAL_COMMUNITY): Payer: Self-pay

## 2023-09-30 ENCOUNTER — Telehealth: Payer: Self-pay | Admitting: Pharmacy Technician

## 2023-09-30 ENCOUNTER — Other Ambulatory Visit: Payer: Self-pay | Admitting: Cardiology

## 2023-09-30 NOTE — Telephone Encounter (Signed)
 Pharmacy Patient Advocate Encounter   Received notification from CoverMyMeds that prior authorization for Dexcom G7 Sensor is due for renewal.   Insurance verification completed.   The patient is insured through [facility].  Action: Medication is now available without a prior authorization. Last PA doesn't expired until 10/24/2023.

## 2023-10-01 NOTE — Telephone Encounter (Signed)
This is a A-Fib clinic pt 

## 2023-11-19 ENCOUNTER — Ambulatory Visit: Payer: Medicaid Other | Admitting: Nurse Practitioner

## 2023-11-23 ENCOUNTER — Ambulatory Visit: Admitting: Nurse Practitioner

## 2023-11-23 DIAGNOSIS — Z7984 Long term (current) use of oral hypoglycemic drugs: Secondary | ICD-10-CM

## 2023-11-23 DIAGNOSIS — E1165 Type 2 diabetes mellitus with hyperglycemia: Secondary | ICD-10-CM

## 2023-11-23 DIAGNOSIS — Z794 Long term (current) use of insulin: Secondary | ICD-10-CM

## 2023-11-23 DIAGNOSIS — I10 Essential (primary) hypertension: Secondary | ICD-10-CM

## 2023-11-23 DIAGNOSIS — E782 Mixed hyperlipidemia: Secondary | ICD-10-CM

## 2023-12-14 ENCOUNTER — Telehealth: Payer: Self-pay

## 2023-12-14 NOTE — Telephone Encounter (Signed)
 Pharmacy Patient Advocate Encounter   Received notification from CoverMyMeds that prior authorization for Dexcom G7 sensor is required/requested.   Insurance verification completed.   The patient is insured through [facility] .   Per test claim: PA required; PA submitted to above mentioned insurance via CoverMyMeds Key/confirmation #/EOC [id number] Status is pending

## 2023-12-15 NOTE — Telephone Encounter (Signed)
 Pharmacy Patient Advocate Encounter  Received notification from [facility] that Prior Authorization for Dexcom G7 Sensor has been APPROVED from 12-15-2023 to 12-14-2024   PA #/Case ID/Reference #: [id number]

## 2023-12-28 ENCOUNTER — Other Ambulatory Visit: Payer: Self-pay | Admitting: *Deleted

## 2023-12-28 DIAGNOSIS — E1165 Type 2 diabetes mellitus with hyperglycemia: Secondary | ICD-10-CM

## 2023-12-28 DIAGNOSIS — Z7984 Long term (current) use of oral hypoglycemic drugs: Secondary | ICD-10-CM

## 2023-12-28 DIAGNOSIS — Z794 Long term (current) use of insulin: Secondary | ICD-10-CM

## 2023-12-28 MED ORDER — BASAGLAR KWIKPEN 100 UNIT/ML ~~LOC~~ SOPN
20.0000 [IU] | PEN_INJECTOR | Freq: Every day | SUBCUTANEOUS | 0 refills | Status: AC
Start: 2023-12-28 — End: ?

## 2024-06-22 ENCOUNTER — Ambulatory Visit: Admitting: Podiatry

## 2024-06-22 DIAGNOSIS — Z0189 Encounter for other specified special examinations: Secondary | ICD-10-CM | POA: Diagnosis not present

## 2024-06-22 DIAGNOSIS — M216X2 Other acquired deformities of left foot: Secondary | ICD-10-CM | POA: Diagnosis not present

## 2024-06-22 DIAGNOSIS — E119 Type 2 diabetes mellitus without complications: Secondary | ICD-10-CM

## 2024-06-22 DIAGNOSIS — M216X1 Other acquired deformities of right foot: Secondary | ICD-10-CM | POA: Diagnosis not present

## 2024-06-22 NOTE — Progress Notes (Signed)
 Subjective: Henry Nguyen presents today referred by Bucio, Elsa C, FNP for diabetic foot evaluation.  Patient relates Many year history of diabetes.  Patient denies any history of foot wounds.  Patient denies any history of numbness, tingling, burning, pins/needles sensations.  Past Medical History:  Diagnosis Date   Arthritis    Diabetes mellitus without complication (HCC)    type 2 - Dexcom G7 System   Dysrhythmia    GERD (gastroesophageal reflux disease)    Heart failure (HCC)    Hypertension    PAF (paroxysmal atrial fibrillation) (HCC)     Patient Active Problem List   Diagnosis Date Noted   Paroxysmal atrial fibrillation (HCC) 07/16/2023   Noncompliance with treatment 06/25/2023   S/P cervical spinal fusion 05/27/2023   Hypercoagulable state due to paroxysmal atrial fibrillation (HCC) 11/25/2022   Diabetes mellitus with hyperglycemia (HCC) 10/23/2022   Chronic kidney disease, stage 2 (mild) 10/23/2022   Atrial fibrillation with RVR (HCC) 10/22/2022   Spinal stenosis of cervical region 09/25/2022   Other spondylosis with radiculopathy, cervical region 09/25/2022   Neural foraminal stenosis of cervical spine 09/25/2022   S/P left knee arthroscopy 10/18/21  11/14/2021   Arthralgia of lower leg 03/13/2021   Long term (current) use of insulin  (HCC) 03/13/2021   Mixed hyperlipidemia 03/13/2021   Osteoarthritis of knee 03/13/2021   Type 2 diabetes mellitus without complications (HCC) 03/13/2021   Vitamin D deficiency 03/13/2021   Folliculitis 03/13/2021   S/P right knee arthroscopy 03/01/21 03/12/2021   Acute medial meniscus tear of left knee    Special screening for malignant neoplasms, colon    Diabetic hyperosmolar non-ketotic state (HCC) 12/01/2017   Hypertension 12/01/2017   GERD (gastroesophageal reflux disease) 12/01/2017   Hyponatremia 12/01/2017   AKI (acute kidney injury) 12/01/2017   Rectal bleeding 12/01/2017    Past Surgical History:  Procedure  Laterality Date   ANTERIOR CERVICAL DECOMP/DISCECTOMY FUSION N/A 05/27/2023   Procedure: C5-6 ANTERIOR CERVICAL DISCECTOMY FUSION, ALLOGRAFT, PLATE;  Surgeon: Barbarann Oneil BROCKS, MD;  Location: [facility];  Service: Orthopedics;  Laterality: N/A;  Needs RNFA   COLONOSCOPY  2010   COLONOSCOPY N/A 07/09/2018   Procedure: COLONOSCOPY;  Surgeon: Harvey Margo CROME, MD;  Location: [facility];  Service: Endoscopy;  Laterality: N/A;  11:15   COLONOSCOPY WITH PROPOFOL  N/A 07/20/2023   Procedure: COLONOSCOPY WITH PROPOFOL ;  Surgeon: Cindie Carlin POUR, DO;  Location: [facility];  Service: Endoscopy;  Laterality: N/A;  1:30 pm, asa 3   ESOPHAGOGASTRODUODENOSCOPY  2010   KNEE ARTHROSCOPY WITH MEDIAL MENISECTOMY Right 03/01/2021   Procedure: KNEE ARTHROSCOPY WITH MEDIAL MENISECTOMY;  Surgeon: Margrette Taft BRAVO, MD;  Location: [facility];  Service: Orthopedics;  Laterality: Right;   KNEE ARTHROSCOPY WITH MEDIAL MENISECTOMY Left 10/18/2021   Procedure: KNEE ARTHROSCOPY WITH PARTIAL MEDIAL MENISECTOMY TEAR;  Surgeon: Margrette Taft BRAVO, MD;  Location: [facility];  Service: Orthopedics;  Laterality: Left;   POLYPECTOMY  07/09/2018   Procedure: POLYPECTOMY;  Surgeon: Harvey Margo CROME, MD;  Location: [facility];  Service: Endoscopy;;   POLYPECTOMY  07/20/2023   Procedure: POLYPECTOMY;  Surgeon: Cindie Carlin POUR, DO;  Location: [facility];  Service: Endoscopy;;   UPPER GI ENDOSCOPY  08/06/2004    Medications Ordered Prior to Encounter[1]   Allergies[2]  Social History   Occupational History   Not on file  Tobacco Use   Smoking status: Never   Smokeless tobacco: Never   Tobacco comments:    Never smoked as of  07/16/23  Vaping Use   Vaping status: Never Used  Substance and Sexual Activity   Alcohol use: Not Currently    Alcohol/week: 3.0 standard drinks of alcohol    Types: 3 Cans of beer per week    Comment: not currently as of 07/16/23   Drug use: Not Currently    Comment: 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   Sexual activity: Not Currently    Birth control/protection: None    Family History  Problem Relation Age of Onset   Heart attack Mother    CAD Mother    Diabetes Sister    Hypertension Sister    Colon cancer Neg Hx    Colon polyps Neg Hx     Immunization History  Administered Date(s) Administered   Influenza,inj,Quad PF,6+ Mos 03/22/2018, 03/30/2019, 03/27/2020   Influenza,trivalent, recombinat, inj, PF 05/29/2017   PFIZER(Purple Top)SARS-COV-2 Vaccination 04/16/2020   Pfizer Covid-19 Vaccine Bivalent Booster 2yrs & up 08/05/2021    Review of systems: Positive Findings in bold print.  Constitutional:  chills, fatigue, fever, sweats, weight change Communication: nurse, learning disability, sign presenter, broadcasting, hand writing, iPad/Android device Head: headaches, head injury Eyes: changes in vision, eye pain, glaucoma, cataracts, macular degeneration, diplopia, glare,  light sensitivity, eyeglasses or contacts, blindness Ears nose mouth throat: hearing impaired, hearing aids,  ringing in ears, deaf, sign language,  vertigo, nosebleeds,  rhinitis,  cold sores, snoring, swollen glands Cardiovascular: HTN, edema, arrhythmia, pacemaker in place, defibrillator in place, chest pain/tightness, chronic anticoagulation, blood clot, heart failure, MI Peripheral Vascular: leg cramps, varicose veins, blood clots, lymphedema, varicosities Respiratory:  asthma, difficulty breathing, denies congestion, SOB, wheezing, cough, emphysema Gastrointestinal: change in appetite or weight, abdominal pain, constipation, diarrhea, nausea, vomiting, vomiting blood, change in bowel habits, abdominal pain, jaundice, rectal bleeding, hemorrhoids, GERD Genitourinary:  nocturia,  pain on urination, polyuria,  blood in urine, Foley catheter, urinary urgency, ESRD on hemodialysis Musculoskeletal: amputation, cramping, stiff joints, painful joints, decreased joint motion, fractures, OA, gout, hemiplegia,  paraplegia, uses cane, wheelchair bound, uses walker, uses rollator Skin: +changes in toenails, color change, dryness, itching, mole changes,  rash, wound(s) Neurological: headaches, numbness in feet, paresthesias in feet, burning in feet, fainting,  seizures, change in speech, migraines, memory problems/poor historian, cerebral palsy, weakness, paralysis, CVA, TIA Endocrine: diabetes, hypothyroidism, hyperthyroidism,  goiter, dry mouth, flushing, heat intolerance, cold intolerance,  excessive thirst, denies polyuria,  nocturia Hematological:  easy bleeding, excessive bleeding, easy bruising, enlarged lymph nodes, on long term blood thinner, history of past transusions Allergy/immunological:  hives, eczema, frequent infections, multiple drug allergies, seasonal allergies, transplant recipient, multiple food allergies Psychiatric:  anxiety, depression, mood disorder, suicidal ideations, hallucinations, insomnia  Objective: There were no vitals filed for this visit. Vascular Examination: Capillary refill time less than 3 seconds x 10 digits.  Dorsalis pedis pulses palpable 2 out of 4.  Posterior tibial pulses palpable 2 out of 4.  Digital hair not present x 10 digits.  Skin temperature gradient WNL b/l.  Dermatological Examination: Skin with normal turgor, texture and tone b/l  Toenails 1-5 b/l discolored, thick, dystrophic with subungual debris and pain with palpation to nailbeds due to thickness of nails.  Musculoskeletal: Muscle strength 5/5 to all LE muscle groups.  Neurological: Sensation intact with 10 gram monofilament.  Vibratory sensation intact.  Assessment: NIDDM Encounter for diabetic foot examination Pes planovalgus  Plan: Discussed diabetic foot care principles. Literature dispensed on today. Patient to continue soft, supportive shoe gear daily. Patient to report  any pedal injuries to medical professional immediately. Follow up one year. Patient/POA to call  should there be a concern in the interim. Pes planovalgus/foot deformity -I explained to patient the etiology of pes planovalgus and relationship with heel pain/arch pain and various treatment options were discussed.  Given patient foot structure in the setting of heel pain/arch pain I believe patient will benefit from custom-made orthotics to help control the hindfoot motion support the arch of the foot and take the stress away from arches.  Patient agrees with the plan like to proceed with orthotics -Patient was casted for orthotics      [1]  Current Outpatient Medications on File Prior to Visit  Medication Sig Dispense Refill   albuterol  (VENTOLIN  HFA) 108 (90 Base) MCG/ACT inhaler Inhale 1-2 puffs into the lungs every 6 (six) hours as needed for wheezing or shortness of breath.     apixaban  (ELIQUIS ) 5 MG TABS tablet Take 1 tablet (5 mg total) by mouth 2 (two) times daily. 60 tablet 5   Continuous Glucose Receiver (DEXCOM G7 RECEIVER) DEVI AS DIRECTED 1 each 0   Continuous Glucose Sensor (DEXCOM G7 SENSOR) MISC Inject 1 Application into the skin as directed. Change sensor every 10 days as directed. 9 each 3   furosemide  (LASIX ) 20 MG tablet Take 1 tablet (20 mg total) by mouth daily as needed (swelling/wt gain). 30 tablet 1   Insulin  Glargine (BASAGLAR  KWIKPEN) 100 UNIT/ML Inject 20 Units into the skin at bedtime. 30 mL 0   insulin  lispro (HUMALOG  KWIKPEN) 100 UNIT/ML KwikPen Inject 0-6 Units into the skin 3 (three) times daily. 18 mL 3   losartan  (COZAAR ) 50 MG tablet Take 1 tablet (50 mg total) by mouth daily. 30 tablet 3   metFORMIN  (GLUCOPHAGE ) 500 MG tablet Take 1 tablet (500 mg total) by mouth daily with breakfast. 90 tablet 3   methocarbamol  (ROBAXIN ) 500 MG tablet Take 1 tablet (500 mg total) by mouth every 6 (six) hours as needed for muscle spasms. 40 tablet 0   metoprolol  succinate (TOPROL -XL) 50 MG 24 hr tablet TAKE 1 TABLET BY MOUTH ONCE DAILY WITH OR IMMEDIATELY FOLLOWING A MEAL  90 tablet 0   omeprazole (PRILOSEC) 20 MG capsule Take 20 mg by mouth daily before breakfast.   5   SEMGLEE , YFGN, 100 UNIT/ML Pen Inject 20 Units into the skin at bedtime. 18 mL 3   traMADol  (ULTRAM ) 50 MG tablet Take 1 tablet (50 mg total) by mouth every 8 (eight) hours as needed. 20 tablet 0   No current facility-administered medications on file prior to visit.  [2] No Known Allergies

## 2024-07-26 ENCOUNTER — Telehealth: Payer: Self-pay | Admitting: Podiatry

## 2024-07-26 NOTE — Telephone Encounter (Signed)
 Orthotics are on the [place] office. Spoke to Pierce and he is scheduled to [facility] on 08/02/24

## 2024-07-27 NOTE — Telephone Encounter (Signed)
 error

## 2024-08-02 ENCOUNTER — Encounter

## 2025-01-30 ENCOUNTER — Ambulatory Visit: Admitting: Physician Assistant
